# Patient Record
Sex: Male | Born: 1935 | Race: White | Hispanic: No | Marital: Married | Smoking: Former smoker
Health system: Southern US, Community
[De-identification: ages and names within clinical notes are randomized; demographics above are authoritative.]

## PROBLEM LIST (undated history)

## (undated) DIAGNOSIS — E119 Type 2 diabetes mellitus without complications: Secondary | ICD-10-CM

## (undated) DIAGNOSIS — K635 Polyp of colon: Secondary | ICD-10-CM

## (undated) DIAGNOSIS — C679 Malignant neoplasm of bladder, unspecified: Secondary | ICD-10-CM

## (undated) DIAGNOSIS — I4891 Unspecified atrial fibrillation: Secondary | ICD-10-CM

## (undated) DIAGNOSIS — I639 Cerebral infarction, unspecified: Secondary | ICD-10-CM

## (undated) DIAGNOSIS — M109 Gout, unspecified: Secondary | ICD-10-CM

## (undated) DIAGNOSIS — I4510 Unspecified right bundle-branch block: Secondary | ICD-10-CM

## (undated) DIAGNOSIS — E7800 Pure hypercholesterolemia, unspecified: Secondary | ICD-10-CM

## (undated) DIAGNOSIS — I10 Essential (primary) hypertension: Secondary | ICD-10-CM

## (undated) DIAGNOSIS — I341 Nonrheumatic mitral (valve) prolapse: Secondary | ICD-10-CM

## (undated) DIAGNOSIS — R251 Tremor, unspecified: Secondary | ICD-10-CM

## (undated) DIAGNOSIS — C61 Malignant neoplasm of prostate: Secondary | ICD-10-CM

## (undated) DIAGNOSIS — I6529 Occlusion and stenosis of unspecified carotid artery: Secondary | ICD-10-CM

## (undated) HISTORY — PX: CHOLECYSTECTOMY: SHX55

## (undated) HISTORY — PX: CAROTID ARTERY ANGIOPLASTY: SHX1300

## (undated) HISTORY — PX: APPENDECTOMY: SHX54

---

## 2007-08-20 ENCOUNTER — Ambulatory Visit: Payer: Self-pay | Admitting: Gastroenterology

## 2007-11-13 ENCOUNTER — Ambulatory Visit: Payer: Self-pay | Admitting: Internal Medicine

## 2007-11-25 ENCOUNTER — Ambulatory Visit: Payer: Self-pay | Admitting: Internal Medicine

## 2007-12-10 ENCOUNTER — Inpatient Hospital Stay: Payer: Self-pay | Admitting: Vascular Surgery

## 2008-04-07 ENCOUNTER — Ambulatory Visit: Payer: Self-pay | Admitting: Internal Medicine

## 2008-12-13 ENCOUNTER — Ambulatory Visit: Payer: Self-pay | Admitting: Internal Medicine

## 2009-01-03 ENCOUNTER — Ambulatory Visit: Payer: Self-pay | Admitting: Internal Medicine

## 2009-02-02 ENCOUNTER — Ambulatory Visit: Payer: Self-pay | Admitting: Internal Medicine

## 2009-03-01 ENCOUNTER — Inpatient Hospital Stay: Payer: Self-pay | Admitting: Internal Medicine

## 2009-03-04 ENCOUNTER — Ambulatory Visit: Payer: Self-pay | Admitting: Internal Medicine

## 2009-03-24 ENCOUNTER — Ambulatory Visit: Payer: Self-pay | Admitting: Cardiothoracic Surgery

## 2009-03-25 ENCOUNTER — Ambulatory Visit: Payer: Self-pay | Admitting: Specialist

## 2009-10-19 ENCOUNTER — Ambulatory Visit: Payer: Self-pay | Admitting: Internal Medicine

## 2010-07-10 ENCOUNTER — Emergency Department: Payer: Self-pay | Admitting: Emergency Medicine

## 2010-07-12 ENCOUNTER — Ambulatory Visit: Payer: Self-pay | Admitting: Gastroenterology

## 2010-07-14 LAB — PATHOLOGY REPORT

## 2010-09-21 ENCOUNTER — Inpatient Hospital Stay: Payer: Self-pay | Admitting: Internal Medicine

## 2010-09-28 ENCOUNTER — Ambulatory Visit: Payer: Self-pay | Admitting: Internal Medicine

## 2010-10-03 ENCOUNTER — Ambulatory Visit: Payer: Self-pay | Admitting: Internal Medicine

## 2010-10-10 ENCOUNTER — Ambulatory Visit: Payer: Self-pay | Admitting: Internal Medicine

## 2010-10-23 ENCOUNTER — Ambulatory Visit: Payer: Self-pay | Admitting: Specialist

## 2010-11-15 ENCOUNTER — Ambulatory Visit: Payer: Self-pay | Admitting: Specialist

## 2011-04-12 ENCOUNTER — Ambulatory Visit: Payer: Self-pay | Admitting: Internal Medicine

## 2011-04-17 ENCOUNTER — Ambulatory Visit: Payer: Self-pay | Admitting: Specialist

## 2011-04-27 ENCOUNTER — Ambulatory Visit: Payer: Self-pay | Admitting: Otolaryngology

## 2012-04-16 ENCOUNTER — Ambulatory Visit: Payer: Self-pay | Admitting: Internal Medicine

## 2013-02-26 ENCOUNTER — Inpatient Hospital Stay: Payer: Self-pay | Admitting: Internal Medicine

## 2013-02-26 LAB — URINALYSIS, COMPLETE
Bilirubin,UR: NEGATIVE
Glucose,UR: 150 mg/dL (ref 0–75)
Hyaline Cast: 10
Leukocyte Esterase: NEGATIVE
Ph: 5 (ref 4.5–8.0)
Protein: 100
RBC,UR: 1 /HPF (ref 0–5)
Squamous Epithelial: <1

## 2013-02-26 LAB — CK TOTAL AND CKMB (NOT AT ARMC)
CK, Total: 164 U/L (ref 35–232)
CK-MB: <0.5 ng/mL — ABNORMAL LOW (ref 0.5–3.6)
CK-MB: 0.8 ng/mL (ref 0.5–3.6)

## 2013-02-26 LAB — CBC
HCT: 33.7 % — ABNORMAL LOW (ref 40.0–52.0)
MCH: 30.4 pg (ref 26.0–34.0)
MCHC: 33.1 g/dL (ref 32.0–36.0)
Platelet: 117 10*3/uL — ABNORMAL LOW (ref 150–440)
RDW: 15.8 % — ABNORMAL HIGH (ref 11.5–14.5)

## 2013-02-26 LAB — TROPONIN I
Troponin-I: 0.12 ng/mL — ABNORMAL HIGH
Troponin-I: 0.43 ng/mL — ABNORMAL HIGH

## 2013-02-26 LAB — COMPREHENSIVE METABOLIC PANEL
Albumin: 2.4 g/dL — ABNORMAL LOW (ref 3.4–5.0)
Alkaline Phosphatase: 52 U/L (ref 50–136)
BUN: 31 mg/dL — ABNORMAL HIGH (ref 7–18)
Chloride: 102 mmol/L (ref 98–107)
Co2: 26 mmol/L (ref 21–32)
Creatinine: 1.96 mg/dL — ABNORMAL HIGH (ref 0.60–1.30)
EGFR (African American): 36 — ABNORMAL LOW
EGFR (Non-African Amer.): 31 — ABNORMAL LOW
Osmolality: 284 (ref 275–301)
Potassium: 3.6 mmol/L (ref 3.5–5.1)
SGOT(AST): 27 U/L (ref 15–37)
SGPT (ALT): 14 U/L (ref 12–78)
Total Protein: 5.9 g/dL — ABNORMAL LOW (ref 6.4–8.2)

## 2013-02-26 LAB — PROTIME-INR: INR: 1.2

## 2013-02-26 LAB — LIPASE, BLOOD: Lipase: 61 U/L — ABNORMAL LOW (ref 73–393)

## 2013-02-27 LAB — CBC WITH DIFFERENTIAL/PLATELET
Basophil %: 1.7 %
Eosinophil #: 0 10*3/uL (ref 0.0–0.7)
HCT: 34.2 % — ABNORMAL LOW (ref 40.0–52.0)
Lymphocyte #: 0.2 10*3/uL — ABNORMAL LOW (ref 1.0–3.6)
Lymphocyte %: 2.3 %
MCHC: 33 g/dL (ref 32.0–36.0)
Monocyte %: 5.5 %
Neutrophil #: 8 10*3/uL — ABNORMAL HIGH (ref 1.4–6.5)
Platelet: 102 10*3/uL — ABNORMAL LOW (ref 150–440)
RBC: 3.69 10*6/uL — ABNORMAL LOW (ref 4.40–5.90)
RDW: 15.6 % — ABNORMAL HIGH (ref 11.5–14.5)
WBC: 8.8 10*3/uL (ref 3.8–10.6)

## 2013-02-27 LAB — COMPREHENSIVE METABOLIC PANEL
Albumin: 2.1 g/dL — ABNORMAL LOW (ref 3.4–5.0)
Alkaline Phosphatase: 47 U/L — ABNORMAL LOW (ref 50–136)
Anion Gap: 6 — ABNORMAL LOW (ref 7–16)
Bilirubin,Total: 1 mg/dL (ref 0.2–1.0)
Calcium, Total: 7.2 mg/dL — ABNORMAL LOW (ref 8.5–10.1)
Chloride: 100 mmol/L (ref 98–107)
Co2: 27 mmol/L (ref 21–32)
Creatinine: 2.18 mg/dL — ABNORMAL HIGH (ref 0.60–1.30)
Potassium: 4 mmol/L (ref 3.5–5.1)
SGPT (ALT): 15 U/L (ref 12–78)
Total Protein: 5.6 g/dL — ABNORMAL LOW (ref 6.4–8.2)

## 2013-02-27 LAB — APTT: Activated PTT: 34.2 secs (ref 23.6–35.9)

## 2013-02-27 LAB — TROPONIN I: Troponin-I: 0.89 ng/mL — ABNORMAL HIGH

## 2013-02-28 LAB — BASIC METABOLIC PANEL
Anion Gap: 4 — ABNORMAL LOW (ref 7–16)
Calcium, Total: 7.6 mg/dL — ABNORMAL LOW (ref 8.5–10.1)
Chloride: 103 mmol/L (ref 98–107)
Co2: 27 mmol/L (ref 21–32)
EGFR (Non-African Amer.): 30 — ABNORMAL LOW
Glucose: 326 mg/dL — ABNORMAL HIGH (ref 65–99)
Osmolality: 291 (ref 275–301)

## 2013-02-28 LAB — HEPATIC FUNCTION PANEL A (ARMC)
Alkaline Phosphatase: 51 U/L (ref 50–136)
SGOT(AST): 57 U/L — ABNORMAL HIGH (ref 15–37)
Total Protein: 5.9 g/dL — ABNORMAL LOW (ref 6.4–8.2)

## 2013-02-28 LAB — CBC WITH DIFFERENTIAL/PLATELET
Basophil #: 0 10*3/uL (ref 0.0–0.1)
Eosinophil #: 0 10*3/uL (ref 0.0–0.7)
Eosinophil %: 0 %
HCT: 34.5 % — ABNORMAL LOW (ref 40.0–52.0)
Lymphocyte #: 0.2 10*3/uL — ABNORMAL LOW (ref 1.0–3.6)
Lymphocyte %: 2.3 %
MCHC: 33.1 g/dL (ref 32.0–36.0)
MCV: 93 fL (ref 80–100)
Monocyte #: 0.7 x10 3/mm (ref 0.2–1.0)
Neutrophil #: 9.2 10*3/uL — ABNORMAL HIGH (ref 1.4–6.5)
Platelet: 99 10*3/uL — ABNORMAL LOW (ref 150–440)
RBC: 3.72 10*6/uL — ABNORMAL LOW (ref 4.40–5.90)
RDW: 15.7 % — ABNORMAL HIGH (ref 11.5–14.5)
WBC: 10.1 10*3/uL (ref 3.8–10.6)

## 2013-02-28 LAB — URINE CULTURE

## 2013-03-01 LAB — CBC WITH DIFFERENTIAL/PLATELET
Basophil #: 0 10*3/uL (ref 0.0–0.1)
Eosinophil #: 0 10*3/uL (ref 0.0–0.7)
Eosinophil %: 0.2 %
HCT: 33.4 % — ABNORMAL LOW (ref 40.0–52.0)
HGB: 10.8 g/dL — ABNORMAL LOW (ref 13.0–18.0)
Lymphocyte #: 0.4 10*3/uL — ABNORMAL LOW (ref 1.0–3.6)
MCHC: 32.4 g/dL (ref 32.0–36.0)
MCV: 93 fL (ref 80–100)
Monocyte #: 0.9 x10 3/mm (ref 0.2–1.0)
Monocyte %: 7.2 %
Neutrophil %: 88.9 %
Platelet: 124 10*3/uL — ABNORMAL LOW (ref 150–440)
RBC: 3.6 10*6/uL — ABNORMAL LOW (ref 4.40–5.90)

## 2013-03-01 LAB — BASIC METABOLIC PANEL
Anion Gap: 5 — ABNORMAL LOW (ref 7–16)
Calcium, Total: 7.7 mg/dL — ABNORMAL LOW (ref 8.5–10.1)
Chloride: 102 mmol/L (ref 98–107)
Co2: 27 mmol/L (ref 21–32)
Creatinine: 1.94 mg/dL — ABNORMAL HIGH (ref 0.60–1.30)
Glucose: 300 mg/dL — ABNORMAL HIGH (ref 65–99)
Osmolality: 296 (ref 275–301)
Sodium: 134 mmol/L — ABNORMAL LOW (ref 136–145)

## 2013-03-02 LAB — BASIC METABOLIC PANEL
BUN: 53 mg/dL — ABNORMAL HIGH (ref 7–18)
Calcium, Total: 7.8 mg/dL — ABNORMAL LOW (ref 8.5–10.1)
Chloride: 101 mmol/L (ref 98–107)
Co2: 28 mmol/L (ref 21–32)
Creatinine: 2.09 mg/dL — ABNORMAL HIGH (ref 0.60–1.30)
EGFR (Non-African Amer.): 29 — ABNORMAL LOW
Osmolality: 290 (ref 275–301)
Sodium: 135 mmol/L — ABNORMAL LOW (ref 136–145)

## 2013-03-02 LAB — CBC WITH DIFFERENTIAL/PLATELET
Basophil #: 0 10*3/uL (ref 0.0–0.1)
Basophil %: 0.2 %
Eosinophil #: 0.1 10*3/uL (ref 0.0–0.7)
HGB: 11.3 g/dL — ABNORMAL LOW (ref 13.0–18.0)
Lymphocyte %: 5.6 %
MCH: 30.8 pg (ref 26.0–34.0)
MCV: 92 fL (ref 80–100)
Neutrophil #: 9.2 10*3/uL — ABNORMAL HIGH (ref 1.4–6.5)
Neutrophil %: 80.8 %
RBC: 3.66 10*6/uL — ABNORMAL LOW (ref 4.40–5.90)
WBC: 11.4 10*3/uL — ABNORMAL HIGH (ref 3.8–10.6)

## 2013-03-03 LAB — CBC WITH DIFFERENTIAL/PLATELET
Basophil %: 0.1 %
Eosinophil #: 0 10*3/uL (ref 0.0–0.7)
HCT: 32.1 % — ABNORMAL LOW (ref 40.0–52.0)
HGB: 10.6 g/dL — ABNORMAL LOW (ref 13.0–18.0)
Lymphocyte #: 0.3 10*3/uL — ABNORMAL LOW (ref 1.0–3.6)
Lymphocyte %: 3 %
MCHC: 33.1 g/dL (ref 32.0–36.0)
Monocyte #: 0.3 x10 3/mm (ref 0.2–1.0)
Platelet: 203 10*3/uL (ref 150–440)
RDW: 15.1 % — ABNORMAL HIGH (ref 11.5–14.5)
WBC: 10.3 10*3/uL (ref 3.8–10.6)

## 2013-03-03 LAB — BASIC METABOLIC PANEL
Calcium, Total: 7.7 mg/dL — ABNORMAL LOW (ref 8.5–10.1)
Chloride: 103 mmol/L (ref 98–107)
Co2: 23 mmol/L (ref 21–32)
Creatinine: 1.79 mg/dL — ABNORMAL HIGH (ref 0.60–1.30)
EGFR (African American): 40 — ABNORMAL LOW
EGFR (Non-African Amer.): 35 — ABNORMAL LOW
Glucose: 252 mg/dL — ABNORMAL HIGH (ref 65–99)
Osmolality: 287 (ref 275–301)
Sodium: 133 mmol/L — ABNORMAL LOW (ref 136–145)

## 2013-03-04 ENCOUNTER — Emergency Department: Payer: Self-pay | Admitting: Emergency Medicine

## 2013-03-04 LAB — CULTURE, BLOOD (SINGLE)

## 2013-03-05 ENCOUNTER — Telehealth: Payer: Self-pay | Admitting: Family Medicine

## 2013-03-05 DIAGNOSIS — K810 Acute cholecystitis: Secondary | ICD-10-CM

## 2013-03-05 DIAGNOSIS — E119 Type 2 diabetes mellitus without complications: Secondary | ICD-10-CM

## 2013-03-05 DIAGNOSIS — K219 Gastro-esophageal reflux disease without esophagitis: Secondary | ICD-10-CM

## 2013-03-05 DIAGNOSIS — I10 Essential (primary) hypertension: Secondary | ICD-10-CM

## 2013-03-05 NOTE — Telephone Encounter (Signed)
[facility] Triage Call Report Triage Record Num: [id number] Operator: Claudie Leach Patient Name: Dean Garcia Call Date & Time: 03/04/2013 8:26:26PM Patient Phone: [phone] PCP: Tillman Abide Patient Gender: Male PCP Fax : [phone] Patient DOB: 10/24/1928 Practice Name: [facility] Reason for Call: Caller: Tom/RN; PCP: Tillman Abide (Family Practice); CB#: [phone]; Call regarding Post-op bleeding; Had Gallbladder removed on 02/27/13. Started bleeding on 03/04/13 and the entire front of the pajamas are wet. States it is more clear than bloody. Blood pressure is 146/64, pulse 64, respirations 28, and temperature 97.4. States they have applied a pressure dressing and it is continuing to bleed. Triaged per Postoperative Wound Care guideline. To activate EMS 911 due to continuous or heavy bleeding from operative side and not controlled with 10 minutes of steady pressure. Advised to be sent to the ED and caller agreed. Protocol(s) Used: Postoperative Wound Care Recommended Outcome per Protocol: Activate EMS 911 Reason for Outcome: Continuous or heavy bleeding from operative site and NOT controlled with 10 minutes of steady pressure Care Advice: ~ IMMEDIATE ACTION 03/04/2013 8:37:21PM Page 1 of 1 [email or web address]

## 2013-03-05 NOTE — Telephone Encounter (Signed)
Seen today Wound is fine

## 2013-03-10 DIAGNOSIS — M1A9XX1 Chronic gout, unspecified, with tophus (tophi): Secondary | ICD-10-CM

## 2013-03-11 DIAGNOSIS — Z48814 Encounter for surgical aftercare following surgery on the teeth or oral cavity: Secondary | ICD-10-CM

## 2013-03-11 DIAGNOSIS — Z48815 Encounter for surgical aftercare following surgery on the digestive system: Secondary | ICD-10-CM

## 2013-05-13 ENCOUNTER — Ambulatory Visit: Payer: Self-pay | Admitting: Internal Medicine

## 2013-05-25 ENCOUNTER — Other Ambulatory Visit: Payer: Self-pay

## 2013-05-25 LAB — TROPONIN I: Troponin-I: 0.04 ng/mL

## 2013-06-04 ENCOUNTER — Ambulatory Visit: Payer: Self-pay | Admitting: Physician Assistant

## 2014-04-01 ENCOUNTER — Ambulatory Visit: Payer: Self-pay | Admitting: Otolaryngology

## 2014-04-01 DIAGNOSIS — E119 Type 2 diabetes mellitus without complications: Secondary | ICD-10-CM

## 2014-04-01 DIAGNOSIS — I2510 Atherosclerotic heart disease of native coronary artery without angina pectoris: Secondary | ICD-10-CM

## 2014-04-01 DIAGNOSIS — I10 Essential (primary) hypertension: Secondary | ICD-10-CM

## 2014-04-01 LAB — CBC WITH DIFFERENTIAL/PLATELET
BASOS ABS: 0 10*3/uL (ref 0.0–0.1)
BASOS PCT: 0.4 %
Eosinophil #: 0.2 10*3/uL (ref 0.0–0.7)
Eosinophil %: 2.3 %
HCT: 43 % (ref 40.0–52.0)
HGB: 14 g/dL (ref 13.0–18.0)
LYMPHS ABS: 0.6 10*3/uL — AB (ref 1.0–3.6)
LYMPHS PCT: 9.3 %
MCH: 31.1 pg (ref 26.0–34.0)
MCHC: 32.5 g/dL (ref 32.0–36.0)
MCV: 96 fL (ref 80–100)
Monocyte #: 0.9 x10 3/mm (ref 0.2–1.0)
Monocyte %: 13.1 %
Neutrophil #: 5.1 10*3/uL (ref 1.4–6.5)
Neutrophil %: 74.9 %
PLATELETS: 168 10*3/uL (ref 150–440)
RBC: 4.48 10*6/uL (ref 4.40–5.90)
RDW: 16.1 % — AB (ref 11.5–14.5)
WBC: 6.8 10*3/uL (ref 3.8–10.6)

## 2014-04-01 LAB — BASIC METABOLIC PANEL
Anion Gap: 5 — ABNORMAL LOW (ref 7–16)
BUN: 27 mg/dL — AB (ref 7–18)
CO2: 29 mmol/L (ref 21–32)
Calcium, Total: 8.9 mg/dL (ref 8.5–10.1)
Chloride: 105 mmol/L (ref 98–107)
Creatinine: 1.4 mg/dL — ABNORMAL HIGH (ref 0.60–1.30)
EGFR (African American): 53 — ABNORMAL LOW
EGFR (Non-African Amer.): 46 — ABNORMAL LOW
GLUCOSE: 123 mg/dL — AB (ref 65–99)
Osmolality: 284 (ref 275–301)
Potassium: 4.4 mmol/L (ref 3.5–5.1)
Sodium: 139 mmol/L (ref 136–145)

## 2014-04-08 ENCOUNTER — Ambulatory Visit: Payer: Self-pay | Admitting: Otolaryngology

## 2014-04-13 LAB — PATHOLOGY REPORT

## 2014-06-07 DIAGNOSIS — C679 Malignant neoplasm of bladder, unspecified: Secondary | ICD-10-CM | POA: Insufficient documentation

## 2014-06-07 DIAGNOSIS — G629 Polyneuropathy, unspecified: Secondary | ICD-10-CM | POA: Insufficient documentation

## 2014-06-07 DIAGNOSIS — M109 Gout, unspecified: Secondary | ICD-10-CM | POA: Insufficient documentation

## 2014-06-07 DIAGNOSIS — G64 Other disorders of peripheral nervous system: Secondary | ICD-10-CM | POA: Insufficient documentation

## 2014-06-07 DIAGNOSIS — E7849 Other hyperlipidemia: Secondary | ICD-10-CM | POA: Insufficient documentation

## 2014-06-07 DIAGNOSIS — M1990 Unspecified osteoarthritis, unspecified site: Secondary | ICD-10-CM | POA: Insufficient documentation

## 2014-06-07 DIAGNOSIS — E119 Type 2 diabetes mellitus without complications: Secondary | ICD-10-CM | POA: Insufficient documentation

## 2014-06-07 DIAGNOSIS — N289 Disorder of kidney and ureter, unspecified: Secondary | ICD-10-CM | POA: Insufficient documentation

## 2014-06-07 DIAGNOSIS — K5790 Diverticulosis of intestine, part unspecified, without perforation or abscess without bleeding: Secondary | ICD-10-CM | POA: Insufficient documentation

## 2014-06-07 DIAGNOSIS — K627 Radiation proctitis: Secondary | ICD-10-CM | POA: Insufficient documentation

## 2014-06-07 DIAGNOSIS — K629 Disease of anus and rectum, unspecified: Secondary | ICD-10-CM | POA: Insufficient documentation

## 2014-06-07 DIAGNOSIS — C61 Malignant neoplasm of prostate: Secondary | ICD-10-CM | POA: Insufficient documentation

## 2014-07-12 DIAGNOSIS — I10 Essential (primary) hypertension: Secondary | ICD-10-CM | POA: Insufficient documentation

## 2014-07-12 DIAGNOSIS — I6529 Occlusion and stenosis of unspecified carotid artery: Secondary | ICD-10-CM | POA: Insufficient documentation

## 2014-07-12 DIAGNOSIS — I341 Nonrheumatic mitral (valve) prolapse: Secondary | ICD-10-CM | POA: Insufficient documentation

## 2014-07-17 DIAGNOSIS — E119 Type 2 diabetes mellitus without complications: Secondary | ICD-10-CM | POA: Insufficient documentation

## 2014-07-17 DIAGNOSIS — R609 Edema, unspecified: Secondary | ICD-10-CM | POA: Insufficient documentation

## 2014-07-17 DIAGNOSIS — R251 Tremor, unspecified: Secondary | ICD-10-CM | POA: Insufficient documentation

## 2014-09-06 ENCOUNTER — Encounter: Payer: Self-pay | Admitting: Surgery

## 2014-09-14 DIAGNOSIS — M109 Gout, unspecified: Secondary | ICD-10-CM | POA: Insufficient documentation

## 2014-09-14 DIAGNOSIS — M1000 Idiopathic gout, unspecified site: Secondary | ICD-10-CM | POA: Insufficient documentation

## 2014-09-23 ENCOUNTER — Emergency Department: Payer: Self-pay | Admitting: Emergency Medicine

## 2014-09-23 LAB — BASIC METABOLIC PANEL
ANION GAP: 7 (ref 7–16)
BUN: 40 mg/dL — AB (ref 7–18)
CREATININE: 1.88 mg/dL — AB (ref 0.60–1.30)
Calcium, Total: 8.3 mg/dL — ABNORMAL LOW (ref 8.5–10.1)
Chloride: 100 mmol/L (ref 98–107)
Co2: 33 mmol/L — ABNORMAL HIGH (ref 21–32)
EGFR (African American): 44 — ABNORMAL LOW
EGFR (Non-African Amer.): 37 — ABNORMAL LOW
Glucose: 87 mg/dL (ref 65–99)
OSMOLALITY: 289 (ref 275–301)
Potassium: 4.2 mmol/L (ref 3.5–5.1)
Sodium: 140 mmol/L (ref 136–145)

## 2014-09-23 LAB — CBC
HCT: 42.5 % (ref 40.0–52.0)
HGB: 13.9 g/dL (ref 13.0–18.0)
MCH: 31.9 pg (ref 26.0–34.0)
MCHC: 32.6 g/dL (ref 32.0–36.0)
MCV: 98 fL (ref 80–100)
Platelet: 194 10*3/uL (ref 150–440)
RBC: 4.35 10*6/uL — ABNORMAL LOW (ref 4.40–5.90)
RDW: 14.4 % (ref 11.5–14.5)
WBC: 8.1 10*3/uL (ref 3.8–10.6)

## 2014-10-23 DIAGNOSIS — I700 Atherosclerosis of aorta: Secondary | ICD-10-CM | POA: Insufficient documentation

## 2014-11-01 ENCOUNTER — Encounter: Payer: Self-pay | Admitting: Surgery

## 2014-11-02 ENCOUNTER — Encounter: Payer: Self-pay | Admitting: Surgery

## 2014-11-05 ENCOUNTER — Ambulatory Visit: Payer: Self-pay | Admitting: Specialist

## 2014-11-06 ENCOUNTER — Encounter: Payer: Self-pay | Admitting: Surgery

## 2014-11-10 ENCOUNTER — Emergency Department: Payer: Self-pay | Admitting: Emergency Medicine

## 2015-03-02 ENCOUNTER — Emergency Department
Admit: 2015-03-02 | Disposition: A | Discharge status: Home / Self Care | Payer: Self-pay | Admitting: Emergency Medicine

## 2015-03-02 LAB — COMPREHENSIVE METABOLIC PANEL
ANION GAP: 13 (ref 7–16)
Albumin: 3.7 g/dL
Alkaline Phosphatase: 65 U/L
BUN: 71 mg/dL — ABNORMAL HIGH
Bilirubin,Total: 0.4 mg/dL
CALCIUM: 8.9 mg/dL
CREATININE: 2.49 mg/dL — AB
Chloride: 96 mmol/L — ABNORMAL LOW
Co2: 30 mmol/L
GFR CALC AF AMER: 26 — AB
GFR CALC NON AF AMER: 23 — AB
Glucose: 167 mg/dL — ABNORMAL HIGH
POTASSIUM: 3.7 mmol/L
SGOT(AST): 27 U/L
SGPT (ALT): 19 U/L
SODIUM: 139 mmol/L
TOTAL PROTEIN: 7.1 g/dL

## 2015-03-02 LAB — BASIC METABOLIC PANEL
ANION GAP: 13 (ref 7–16)
BUN: 62 mg/dL — AB
Calcium, Total: 8.1 mg/dL — ABNORMAL LOW
Chloride: 98 mmol/L — ABNORMAL LOW
Co2: 29 mmol/L
Creatinine: 2.14 mg/dL — ABNORMAL HIGH
EGFR (African American): 32 — ABNORMAL LOW
GFR CALC NON AF AMER: 27 — AB
Glucose: 149 mg/dL — ABNORMAL HIGH
POTASSIUM: 3.1 mmol/L — AB
Sodium: 140 mmol/L

## 2015-03-02 LAB — CBC WITH DIFFERENTIAL/PLATELET
BASOS PCT: 0.5 %
Basophil #: 0 10*3/uL (ref 0.0–0.1)
EOS PCT: 1.1 %
Eosinophil #: 0.1 10*3/uL (ref 0.0–0.7)
HCT: 40.8 % (ref 40.0–52.0)
HGB: 13.4 g/dL (ref 13.0–18.0)
Lymphocyte #: 0.7 10*3/uL — ABNORMAL LOW (ref 1.0–3.6)
Lymphocyte %: 8 %
MCH: 31.5 pg (ref 26.0–34.0)
MCHC: 32.8 g/dL (ref 32.0–36.0)
MCV: 96 fL (ref 80–100)
Monocyte #: 1 x10 3/mm (ref 0.2–1.0)
Monocyte %: 12.2 %
Neutrophil #: 6.5 10*3/uL (ref 1.4–6.5)
Neutrophil %: 78.2 %
Platelet: 220 10*3/uL (ref 150–440)
RBC: 4.25 10*6/uL — AB (ref 4.40–5.90)
RDW: 14.9 % — ABNORMAL HIGH (ref 11.5–14.5)
WBC: 8.3 10*3/uL (ref 3.8–10.6)

## 2015-03-02 LAB — LACTIC ACID, PLASMA
LACTIC ACID, VENOUS: 2.4 mmol/L — AB
Lactic Acid, Venous: 1.5 mmol/L

## 2015-03-02 LAB — URINALYSIS, COMPLETE
BLOOD: NEGATIVE
Bacteria: NONE SEEN
Bilirubin,UR: NEGATIVE
Glucose,UR: NEGATIVE mg/dL (ref 0–75)
KETONE: NEGATIVE
Leukocyte Esterase: NEGATIVE
Nitrite: NEGATIVE
PH: 7 (ref 4.5–8.0)
PROTEIN: NEGATIVE
Specific Gravity: 1.009 (ref 1.003–1.030)
Squamous Epithelial: NONE SEEN
WBC UR: <1 /HPF (ref 0–5)

## 2015-03-02 LAB — LIPASE, BLOOD: LIPASE: 56 U/L — AB

## 2015-03-02 LAB — MAGNESIUM: Magnesium: 2.3 mg/dL

## 2015-03-07 LAB — CULTURE, BLOOD (SINGLE)

## 2015-03-18 ENCOUNTER — Other Ambulatory Visit: Payer: Self-pay | Admitting: Specialist

## 2015-03-18 DIAGNOSIS — R918 Other nonspecific abnormal finding of lung field: Secondary | ICD-10-CM

## 2015-03-25 NOTE — H&P (Signed)
PATIENT NAME:  Dean Garcia, Dean Garcia MR#:  [id number] DATE OF BIRTH:  Nov 27, 1928  DATE OF ADMISSION:  02/26/2013  ADDENDUM  CT of the abdomen is obtained showing acute cholecystitis with air-fluid levels, possible air-forming organism and thickening of the gallbladder wall and pericholecystic fluid. I got the report directly from the radiologist.   The patient at this moment is stable, but we are going to order a surgical consultation. The patient is not cleared for surgery right now, as he has elevated troponin. His elevated troponin is very likely to be secondary to troponin leak due to his acute kidney injury, although I cannot be 100% sure, for what we are going to do troponins every 8 hours, and I am going to order an echocardiograms and cardiac consultation prior to clearing him for surgery.   I discussed the case with radiology and with Dr. Marina Gravel, who is kind enough to see the patient later on.   ____________________________ Latah Sink, MD [email or web address] D: 02/26/2013 19:15:05 ET T: 02/26/2013 19:38:12 ET JOB#: [id number]  cc: Archer Sink, MD, <Dictator> Ellionna Buckbee America Brown MD ELECTRONICALLY SIGNED 02/27/2013 13:40

## 2015-03-25 NOTE — Discharge Summary (Signed)
Dates of Admission and Diagnosis:  Date of Admission 26-Feb-2013   Date of Discharge 03-Mar-2013   Admitting Diagnosis choleycystitis   Final Diagnosis choleycystitis   Discharge Diagnosis 1 Encephalopathy   2 DM II, uncontrolled   3 HTN   4 COPD flair    Chief Complaint/History of Present Illness see H and P   LabObservation:  28-Mar-14 07:32   OBSERVATION Reason for Test  Hepatic:  27-Mar-14 14:05   Bilirubin, Total 1.0  Alkaline Phosphatase 52  SGPT (ALT) 14  SGOT (AST) 27  Total Protein, Serum  5.9  Albumin, Serum  2.4  28-Mar-14 05:43   Bilirubin, Total 1.0  Alkaline Phosphatase  47  SGPT (ALT) 15  SGOT (AST) 22  Total Protein, Serum  5.6  Albumin, Serum  2.1  29-Mar-14 05:14   Bilirubin, Total 0.7  Bilirubin, Direct 0.2 (Result(s) reported on 28 Feb 2013 at 05:42AM.)  Alkaline Phosphatase 51  SGPT (ALT) 34  SGOT (AST)  57  Total Protein, Serum  5.9  Albumin, Serum  2.0  Pathology:  28-Mar-14 00:12   Pathology Report ========== TEST NAME ==========  ========= RESULTS =========  = REFERENCE RANGE =  PATHOLOGY REPORT  Pathology Report .                               [   Final Report         ]                   Material submitted:         Marland Kitchen GALLBLADDER .                               [   Final Report         ]                   Pre-operative diagnosis:                                        . EMPHYSEMATOUS CHOLECYSTITIS LAPAROSCOPIC CHOLECYSTECTOMY .          [   Final Report         ]                   ********************************************************************** Diagnosis: GALLBLADDER: - ACUTE CHOLECYSTITIS AND CHOLELITHIASIS. - NEGATIVE FOR DYSPLASIA AND MALIGNANCY. . XDB/03/02/2013 ********************************************************************** .                               [   Final Report         ]                   Electronically signed:                                     . Lum Babe, MD, Pathologist .                                [   Final Report         ]  Gross description:                                         . Received in a formalin filled container labeled Cashel Bellina is an 11.7 x 5.7 x 2.8 cm gallbladder.  The serosalsurfaces are mottled pink tan to green tan with adherent pyogenic membrane. Within the lumen is a dark red brown slightly viscous fluid admixed with multiple, up to 100, black multifaceted firm calculi measuring up to 0.8 cm in greatest dimensions.  The mucosa is bile stained and focally necrotic.  The wall thickness ranges from 0.1 to 0.3 cm.  The cystic duct is partially obstructed by multiple calculi.  No discrete pericystic duct lymph node candidate is identified.  Representative sections are submitted in cassette 1. Part A, Block 1 - 3 pieces QAC/ASB .                               [   Final Report         ]                   Pathologist provided ICD-9: 574.00 .                               [   Final Report         ]             CPT                                                        . 330076               [facility]            No: [id number]           [address] [place], [place] [address]           Lindon Romp, [place]                                         [phone] [id number]   Result(s) reported on 02 Mar 2013 at 01:53PM.  LabUnknown:  27-Mar-14 15:35   CEFOXITIN S  Routine BB:  28-Mar-14 09:55   ABO Group + Rh Type A Positive  Antibody Screen NEGATIVE (Result(s) reported on 27 Feb 2013 at 10:55AM.)  Routine Micro:  27-Mar-14 15:35   Organism Name ESCHERICHIA COLI  Micro Text Report BLOOD CULTURE   ORGANISM 1                ESCHERICHIA COLI   COMMENT                   IN AEROBIC BOTTLE ONLY   GRAM STAIN                GRAM NEGATIVE ROD   ANTIBIOTIC  ORG#1     AMPICILLIN                    R         CEFAZOLIN                     S         CEFOXITIN                      S         CEFTAZIDIME                   S         CEFTRIAXONE                   S         CIPROFLOXACIN                 S         GENTAMICIN                    S         IMIPENEM                   S         LEVOFLOXACIN                  S  Micro Text Report BLOOD CULTURE   COMMENT                   NO GROWTH IN 36 HOURS   ANTIBIOTIC                       Organism 1 ESCHERICHIA COLI  Culture Comment IN AEROBIC BOTTLE ONLY  Culture Comment NO GROWTH IN 36 HOURS  Result(s) reported on 28 Feb 2013 at 06:48AM.  Cefazolin Sensitivity S  Ampicillin Sensitivity R  Ceftriaxone Sensitivity S  Ciprofloxacin Sensitivity S  Gentamicin Sensitivity S  Ceftazidime Sensitivity S  Imipenem Sensitivity S  Lefofloxacin Sensitivity S  Culture Comment . ID TO FOLLOW SENSITIVITIES TO FOLLOW  Gram Stain 1 GRAM NEGATIVE ROD    16:31   Organism Name AEROBIC GRAM POS ROD,NO FURTHER ID  Organism Quantity 3000 CFU/ML  Micro Text Report URINE CULTURE   ORGANISM 1                3000 CFU/ML AEROBIC GRAM POS ROD,NO FURTHER ID   COMMENT                   -   ANTIBIOTIC                       Specimen Source CLEAN CATCH  Organism 1 3000 CFU/ML AEROBIC GRAM POS ROD,NO FURTHER ID  Culture Comment - (Result(s) reported on 28 Feb 2013 at 12:46PM.)  Cardiology:  28-Mar-14 07:32   Echo Doppler REASON FOR EXAM:     COMMENTS:     PROCEDURE: [facility] - ECHO DOPPLER COMPLETE  - Feb 27 2013  7:32AM   RESULT: Echocardiogram Report  Patient Name:   Dean Garcia Date of Exam: 02/27/2013 Medical Rec #:  [id number]     Custom1: Date of Birth:  May 10, 1928 Height:       74.0 in Patient Age:    81 years   Weight:  189.6 lb Patient Gender: M          BSA:          2.12 m??  Indications: Angina Sonographer:    JERRY HEGE RDCS Referring Phys: Frazier Richards, W  Summary:  1. Left ventricular ejection fraction, by visual estimation, is 40 to  45%.  2. Mildly decreased global left ventricular systolic function.   3. Mildly dilated left atrium.  4. Mildly dilated right atrium.  5. Mild to moderate mitral valve regurgitation.  6. Mild to moderate tricuspid regurgitation.  7. Mildly increased left ventricular posterior wall thickness. 2D AND M-MODE MEASUREMENTS (normal ranges within parentheses): Left Ventricle:          Normal IVSd (2D):      1.23 cm (0.7-1.1) LVPWd (2D): 1.22 cm (0.7-1.1) Aorta/LA:                  Normal LVIDd (2D):     4.65 cm (3.4-5.7) Aortic Root (2D): 3.20 cm (2.4-3.7) LVIDs (2D):     3.20 cm           Left Atrium (2D): 5.00 cm (1.9-4.0) LV FS (2D):     31.1 %   (>25%) LV EF (2D):     58.8 %   (>50%)                                   Right Ventricle:                                   RVd (2D):        0.03 cm LV DIASTOLIC FUNCTION: MV Peak E: 0.80 m/s E/e' Ratio: 14.20 MV Peak A: 0.61 m/s Decel Time: 222 msec E/A Ratio: 1.32 SPECTRAL DOPPLER ANALYSIS (where applicable): Mitral Valve: MV P1/2 Time: 64.38 msec MV Area, PHT: 3.42 cm?? Aortic Valve: AoV Max Vel: 1.41 m/s AoV Peak PG: 8.0 mmHg AoV Mean PG: LVOT Vmax: 0.88 m/s LVOT VTI:  LVOT Diameter: 2.10 cm AoV Area, Vmax: 2.17 cm?? AoV Area, VTI:  AoV Area, Vmn: Tricuspid Valve and PA/RV Systolic Pressure: TR Max Velocity: 2.51 m/s RA  Pressure: 5 mmHg RVSP/PASP: 30.2 mmHg Pulmonic Valve: PV Max Velocity: 0.90 m/s PV Max PG: 3.3 mmHg PV Mean PG:  PHYSICIAN INTERPRETATION: Left Ventricle: The left ventricular internal cavity size was normal. LV  posterior wall thickness was mildly increased. Global LV systolic  function was mildly decreased. Left ventricular ejection fraction, by  visual estimation, is 40 to45%. Right Ventricle: Normal right ventricular size, wall thickness, and  systolic function. RV wall thickness is normal. Left Atrium: The left atrium is mildly dilated. Right Atrium: The right atrium is mildly dilated. Pericardium: There is no evidence of pericardial effusion. Mitral Valve: Mild to moderate mitral  valve regurgitation is seen. Tricuspid Valve: Mild to moderate tricuspid regurgitation is visualized.  The tricuspid regurgitant velocity is 2.51 m/s, and with an assumed right atrial pressure of 5 mmHg, the estimated right ventricular systolic  pressure is normal at 30.2 mmHg. Aortic Valve: The aortic valve is trileaflet and structurally normal,  with normal leaflet excursion; without any evidence of aortic stenosis or  insufficiency. Pulmonic Valve: Structurally normal pulmonic valve, with normal leaflet  excursion. Aorta: The aortic root and ascending aorta are structurally normal, with  no evidence of dilitation. Dexter MD Electronically  signed by [id number] Serafina Royals MD Signature Date/Time: 02/28/2013/11:11:53 AM  *** Final ***  IMPRESSION: .    Verified By: Corey Skains  ([facility] [employer]), M.D., MD  Routine Chem:  27-Mar-14 14:05   Glucose, Serum  196  BUN  31  Creatinine (comp)  1.96  Sodium, Serum 136  Potassium, Serum 3.6  Chloride, Serum 102  CO2, Serum 26  Calcium (Total), Serum  7.4  Anion Gap 8  Osmolality (calc) 284  eGFR (African American)  36  eGFR (Non-African American)  31 (eGFR values <21m/min/1.73 m2 may be an indication of chronic kidney disease (CKD). Calculated eGFR is useful in patients with stable renal function. The eGFR calculation will not be reliable in acutely ill patients when serum creatinine is changing rapidly. It is not useful in  patients on dialysis. The eGFR calculation may not be applicable to patients at the low and high extremes of body sizes, pregnant women, and vegetarians.)  Result Comment HEM/PLT - CLOTTED SPECIMEN. REQUESTED NEW  Result(s) reported on 26 Feb 2013 at 03:01PM.  Result Comment troponin - RESULTS VERIFIED BY REPEAT TESTING.  - c/Kelly Pendelton @15 :15 02-26-13 by  - LJW  - READ-BACK PROCESS PERFORMED.  Result(s) reported on 26 Feb 2013 at 03:18PM.  Lipase  61 (Result(s) reported on 26 Feb 2013 at  03:50PM.)    15:35   Result Comment AEROBIC BOTTLE - SKY TO KJacqulynn Cadet@ 0705/ 02-27-13  - NOTIFIED OF CRITICAL VALUE  - READ-BACK PROCESS PERFORMED.  Result(s) reported on 01 Mar 2013 at 07:22AM.    21:45   Result Comment troponin - RESULTS VERIFIED BY REPEAT TESTING.  - prev called 3/27/14by ljw at 1515.nbb  Result(s) reported on 26 Feb 2013 at 11:01PM.  28-Mar-14 05:43   Glucose, Serum  174  BUN  33  Creatinine (comp)  2.18  Sodium, Serum  133  Potassium, Serum 4.0  Chloride, Serum 100  CO2, Serum 27  Calcium (Total), Serum  7.2  Anion Gap  6 (Result(s) reported on 27 Feb 2013 at 06:17AM.)  Osmolality (calc) 278  Result Comment TROPONIN - RESULTS VERIFIED BY REPEAT TESTING.  - RESULT CALLED PREVIOUSLY 02/26/13 AT 1515  - BY LJW-DAC  Result(s) reported on 27 Feb 2013 at 06:58AM.  Magnesium, Serum 1.8 (1.8-2.4 THERAPEUTIC RANGE: 4-7 mg/dL TOXIC: > 10 mg/dL  -----------------------)  Hemoglobin A1c ([facility])  6.7 (The American Diabetes Association recommends that a primary goal of therapy should be <7% and that physicians should reevaluate the treatment regimen in patients with HbA1c values consistently >8%.)  29-Mar-14 05:14   Glucose, Serum  326  BUN  41  Creatinine (comp)  1.99  Sodium, Serum  134  Potassium, Serum 4.6  Chloride, Serum 103  CO2, Serum 27  Calcium (Total), Serum  7.6  Anion Gap  4  Osmolality (calc) 291  eGFR (African American)  35  eGFR (Non-African American)  30 (eGFR values <659mmin/1.73 m2 may be an indication of chronic kidney disease (CKD). Calculated eGFR is useful in patients with stable renal function. The eGFR calculation will not be reliable in acutely ill patients when serum creatinine is changing rapidly. It is not useful in  patients on dialysis. The eGFR calculation may not be applicable to patients at the low and high extremes of body sizes, pregnant women, and vegetarians.)  30-Mar-14 05:09   Glucose, Serum  300  BUN  58   Creatinine (comp)  1.94  Sodium, Serum  134  Potassium, Serum 4.4  Chloride, Serum 102  CO2, Serum 27  Calcium (Total), Serum  7.7  Anion Gap  5  Osmolality (calc) 296  eGFR (African American)  36  eGFR (Non-African American)  31 (eGFR values <64m/min/1.73 m2 may be an indication of chronic kidney disease (CKD). Calculated eGFR is useful in patients with stable renal function. The eGFR calculation will not be reliable in acutely ill patients when serum creatinine is changing rapidly. It is not useful in  patients on dialysis. The eGFR calculation may not be applicable to patients at the low and high extremes of body sizes, pregnant women, and vegetarians.)  31-Mar-14 04:42   Glucose, Serum  206  BUN  53  Creatinine (comp)  2.09  Sodium, Serum  135  Potassium, Serum 4.7  Chloride, Serum 101  CO2, Serum 28  Calcium (Total), Serum  7.8  Anion Gap  6  Osmolality (calc) 290  eGFR (African American)  33  eGFR (Non-African American)  29 (eGFR values <637mmin/1.73 m2 may be an indication of chronic kidney disease (CKD). Calculated eGFR is useful in patients with stable renal function. The eGFR calculation will not be reliable in acutely ill patients when serum creatinine is changing rapidly. It is not useful in  patients on dialysis. The eGFR calculation may not be applicable to patients at the low and high extremes of body sizes, pregnant women, and vegetarians.)  01-Apr-14 05:06   Glucose, Serum  252  BUN  47  Creatinine (comp)  1.79  Sodium, Serum  133  Potassium, Serum 4.8  Chloride, Serum 103  CO2, Serum 23  Calcium (Total), Serum  7.7  Anion Gap 7  Osmolality (calc) 287  eGFR (African American)  40  eGFR (Non-African American)  35 (eGFR values <6060min/1.73 m2 may be an indication of chronic kidney disease (CKD). Calculated eGFR is useful in patients with stable renal function. The eGFR calculation will not be reliable in acutely ill patients when serum  creatinine is changing rapidly. It is not useful in  patients on dialysis. The eGFR calculation may not be applicable to patients at the low and high extremes of body sizes, pregnant women, and vegetarians.)  Result Comment POTASSIUM - Slight hemolysis, interpret results with  - caution.  Result(s) reported on 03 Mar 2013 at 06:36AM.  Cardiac:  27-Mar-14 14:05   CK, Total 164  CPK-MB, Serum  < 0.5 (Result(s) reported on 26 Feb 2013 at 03:11PM.)  Troponin I  0.12 (0.00-0.05 0.05 ng/mL or less: NEGATIVE  Repeat testing in 3-6 hrs  if clinically indicated. >0.05 ng/mL: POTENTIAL  MYOCARDIAL INJURY. Repeat  testing in 3-6 hrs if  clinically indicated. NOTE: An increase or decrease  of 30% or more on serial  testing suggests a  clinically important change)    21:45   CK, Total 187  CPK-MB, Serum 0.8 (Result(s) reported on 26 Feb 2013 at 10:56PM.)  Troponin I  0.43 (0.00-0.05 0.05 ng/mL or less: NEGATIVE  Repeat testing in 3-6 hrs  if clinically indicated. >0.05 ng/mL: POTENTIAL  MYOCARDIAL INJURY. Repeat  testing in 3-6 hrs if  clinically indicated. NOTE: An increase or decrease  of 30% or more on serial  testing suggests a  clinically important change)  28-Mar-14 05:43   CK, Total 149  CPK-MB, Serum 1.2 (Result(s) reported on 27 Feb 2013 at 06:30AM.)  Troponin I  0.89 (0.00-0.05 0.05 ng/mL or less: NEGATIVE  Repeat testing in 3-6 hrs  if clinically indicated. >0.05 ng/mL: POTENTIAL  MYOCARDIAL INJURY. Repeat  testing in 3-6 hrs if  clinically indicated. NOTE: An increase or decrease  of 30% or more on serial  testing suggests a  clinically important change)  Routine UA:  27-Mar-14 16:31   Color (UA) Yellow  Clarity (UA) Hazy  Glucose (UA) 150 mg/dL  Bilirubin (UA) Negative  Ketones (UA) Negative  Specific Gravity (UA) 1.014  Blood (UA) 2+  pH (UA) 5.0  Protein (UA) 100 mg/dL  Nitrite (UA) Negative  Leukocyte Esterase (UA) Negative (Result(s) reported on 26 Feb 2013 at 04:57PM.)  RBC (UA) 1 /HPF  WBC (UA) <1 /HPF  Bacteria (UA) NONE SEEN  Epithelial Cells (UA) <1 /HPF  Mucous (UA) PRESENT  Hyaline Cast (UA) 10 /LPF (Result(s) reported on 26 Feb 2013 at 04:57PM.)  Routine Coag:  27-Mar-14 14:05   Prothrombin  15.5  INR 1.2 (INR reference interval applies to patients on anticoagulant therapy. A single INR therapeutic range for coumarins is not optimal for all indications; however, the suggested range for most indications is 2.0 - 3.0. Exceptions to the INR Reference Range may include: Prosthetic heart valves, acute myocardial infarction, prevention of myocardial infarction, and combinations of aspirin and anticoagulant. The need for a higher or lower target INR must be assessed individually. Reference: The Pharmacology and Management of the Vitamin K  antagonists: the seventh ACCP Conference on Antithrombotic and Thrombolytic Therapy. XBWIO.0355 Sept:126 (3suppl): [id number]. A HCT value >55% may artifactually increase the PT.  In one study,  the increase was an average of 25%. Reference:  "Effect on Routine and Special Coagulation Testing Values of Citrate Anticoagulant Adjustment in Patients with High HCT Values." American Journal of Clinical Pathology 9741;638:453-646.)  28-Mar-14 09:55   Activated PTT (APTT) 34.2 (A HCT value >55% may artifactually increase the APTT. In one study, the increase was an average of 19%. Reference: "Effect on Routine and Special Coagulation Testing Values of Citrate Anticoagulant Adjustment in Patients with High HCT Values." American Journal of Clinical Pathology 2006;126:400-405.)  Fibrinogen > 750 (A HCT value >55% may artifactually increase the Fibrinogen.  In one study, the increase was an average of 10%. Reference:  "Effect of Routine and Special Coagulation Testing Values of Citrate Anticoagulant Adjustment in Patients with High HCT Values." American Journal of Clinical Pathology 2006;126:400-405.)   Routine Hem:  27-Mar-14 14:05   WBC (CBC) -  RBC (CBC) -  Hemoglobin (CBC) -  Hematocrit (CBC) -  Platelet Count (CBC) -  MCV -  MCH -  MCHC -  RDW -    15:08   WBC (CBC)  11.3  RBC (CBC)  3.68  Hemoglobin (CBC)  11.2  Hematocrit (CBC)  33.7  Platelet Count (CBC)  117 (Result(s) reported on 26 Feb 2013 at 03:18PM.)  MCV 92  MCH 30.4  MCHC 33.1  RDW  15.8  28-Mar-14 05:43   WBC (CBC) 8.8  RBC (CBC)  3.69  Hemoglobin (CBC)  11.3  Hematocrit (CBC)  34.2  Platelet Count (CBC)  102  MCV 93  MCH 30.6  MCHC 33.0  RDW  15.6  Neutrophil % 90.5  Lymphocyte % 2.3  Monocyte % 5.5  Eosinophil % 0.0  Basophil % 1.7  Neutrophil #  8.0  Lymphocyte #  0.2  Monocyte # 0.5  Eosinophil # 0.0  Basophil # 0.1 (Result(s) reported on 27 Feb 2013 at 07:00AM.)  29-Mar-14 05:14   WBC (CBC) 10.1  RBC (CBC)  3.72  Hemoglobin (CBC)  11.4  Hematocrit (CBC)  34.5  Platelet Count (  CBC)  99  MCV 93  MCH 30.7  MCHC 33.1  RDW  15.7  Neutrophil % 90.7  Lymphocyte % 2.3  Monocyte % 6.6  Eosinophil % 0.0  Basophil % 0.4  Neutrophil #  9.2  Lymphocyte #  0.2  Monocyte # 0.7  Eosinophil # 0.0  Basophil # 0.0 (Result(s) reported on 28 Feb 2013 at 05:42AM.)  30-Mar-14 05:09   WBC (CBC)  11.9  RBC (CBC)  3.60  Hemoglobin (CBC)  10.8  Hematocrit (CBC)  33.4  Platelet Count (CBC)  124  MCV 93  MCH 30.0  MCHC 32.4  RDW  15.4  Neutrophil % 88.9  Lymphocyte % 3.5  Monocyte % 7.2  Eosinophil % 0.2  Basophil % 0.2  Neutrophil #  10.6  Lymphocyte #  0.4  Monocyte # 0.9  Eosinophil # 0.0  Basophil # 0.0 (Result(s) reported on 01 Mar 2013 at 05:52AM.)  31-Mar-14 04:42   WBC (CBC)  11.4  RBC (CBC)  3.66  Hemoglobin (CBC)  11.3  Hematocrit (CBC)  33.6  Platelet Count (CBC) 170  MCV 92  MCH 30.8  MCHC 33.5  RDW  15.5  Neutrophil % 80.8  Lymphocyte % 5.6  Monocyte % 12.7  Eosinophil % 0.7  Basophil % 0.2  Neutrophil #  9.2  Lymphocyte #  0.6  Monocyte #  1.5  Eosinophil #  0.1  Basophil # 0.0 (Result(s) reported on 02 Mar 2013 at 05:16AM.)  01-Apr-14 05:06   WBC (CBC) 10.3  RBC (CBC)  3.51  Hemoglobin (CBC)  10.6  Hematocrit (CBC)  32.1  Platelet Count (CBC) 203  MCV 91  MCH 30.2  MCHC 33.1  RDW  15.1  Neutrophil % 93.9  Lymphocyte % 3.0  Monocyte % 2.8  Eosinophil % 0.2  Basophil % 0.1  Neutrophil #  9.6  Lymphocyte #  0.3  Monocyte # 0.3  Eosinophil # 0.0  Basophil # 0.0 (Result(s) reported on 03 Mar 2013 at 06:36AM.)   PERTINENT RADIOLOGY STUDIES: XRay:    27-Mar-14 15:48, Abdomen Flat and Erect  Abdomen Flat and Erect   REASON FOR EXAM:    abdominal distension, vomiting, obstipation  COMMENTS:       PROCEDURE: DXR - DXR ABDOMEN 2 V FLAT AND ERECT  - Feb 26 2013  3:48PM     RESULT: There is a large amount of fecal material in the right colon and   transverse colon aswell as in the rectosigmoid region. There is air   within loops of small bowel t upper limits of normal. Respiratory motion   artifact limits visualization on the erect view but no gross free air is   seen. A decubitus view was not performed.    IMPRESSION:   1. Findings which can be consistent with constipation. No definite   obstruction or perforation evident. CT is available for further   assessment if the patient has persistent or worsening symptoms.  Dictation Site: 2        Verified By: Sundra Aland, M.D., MD  [place]:    29-Mar-14 11:54, [place] Color Flow Doppler Low Extrem Left (Leg)  [place] Color Flow Doppler Low Extrem Left (Leg)   REASON FOR EXAM:    edema  COMMENTS:       PROCEDURE: [place]  - US DOPPLER LOW EXTR LEFT  - Feb 28 2013 11:54AM     RESULT: Technique: Pearline Cables scale, Duplex color  flow doppler, and spectral   waveform  imaging was performed of the deep venous structures of the LEFT   lower extremity.    Findings: There is no evidence of increased echogenicity,   non-compressibility, abnormal waveform, abnormal grayscale, nor abnormal   color flow with  in the interrogated deep venous structures of the LEFT   lower extremity. There is appropriate response to Valsalva and   augmentation within the interrogated vessels.  IMPRESSION:     1. No sonographic evidence of a deep venous thrombus within the   interrogated vessels of the LEFT lower extremity.        Verified MA:UQJFHL W. COOPER, M.D., MD  LabUnknown:    27-Mar-14 15:35, Blood Culture  CEFOXITIN S  CT:    27-Mar-14 18:19, CT Abdomen and Pelvis Without Contrast  CT Abdomen and Pelvis Without Contrast   REASON FOR EXAM:    (1) abdominal pain/ fever distention ( ascitis? );   (2) same  COMMENTS:       PROCEDURE: CT  - CT ABDOMEN AND PELVIS W0  - Feb 26 2013  6:19PM     RESULT: History: Pain and fever.     Comparison study: Prior abdominal series 02/26/2013.    Findings: Standard nonenhanced CT obtained. Bilateral small pleural   effusions are present. Infiltrates are noted in both lung bases   consistent with pneumonia. Cardiomegaly. Mild pericardial thickening.   Small stable hepatic lesion no the periphery of the right lobe consistent   with tiny cysts. Small this is stable. Small cyst no the right or hepatic     lobe. Gallstones are present. Air is noted in the gallbladder.   Gallbladder wall thickening is noted. Adjacent fat plane stranding. These   findings are consistent with cholelithiasis with cholecystitis. Air   producing infection may be present present within the gallbladder. Air   within the gallbladder is new from 04/17/2011. Pancreas is normal. Spleen   is normal. There is nobiliary distention. Innumerable bilateral renal   cysts present. No hydronephrosis. Nonobstructing bilateral   nephrolithiasis present. Surgical clips right lower quadrant. Appendix   not densely identified. No inflammatory change in right lower quadrant.   No inflammatory change left lower quadrant. Large amount stool is present   throughout the colon there is umbilical hernia with  herniation of colon.   No evidence of strangulation . Distended loops of colon in the upper   abdomen and right upper quadrant, this may be from adjacent inflammatory   change related to the gallbladder. Mild distention secondary to the   umbilical hernia may be present. There is no free air. Bladder is     nondistended. Inguinal hernias with herniation of fat only noted.    IMPRESSION:   1. Cholelithiasis.  2. Findings consistent with cholecystitis. Air is noted within the within   the gallbladder. Air producing infection could present in this fashion.  3. Bibasilar pneumonia and small pleural effusions.  4. Umbilical hernia as described above.        Verified By: Osa Craver, M.D., MD   Hospital Course:  Hospital Course Admitted with abd pain nausea, ultimately found to have choleycytitis explaining his symptoms. He did have surgery which went well. Some post op confusion and wheezing and hyperglycemia. Improved with time, steriods and more insullin. Still is weak and somehwat ataxic so discharging to sn[facility] where he resides in Independent living. Note it took 35 min for dc tasks today   Condition on Discharge Satisfactory   DISCHARGE INSTRUCTIONS HOME MEDS:  Medication Reconciliation: Patient's Home Medications at Discharge:     Medication Instructions  januvia tablet 100 mg  1 tab(s) orally once a day   allopurinol tablet 100 mg  1 tab(s) orally once a day (at bedtime)   torsemide 20 mg oral tablet  1 tab(s) orally once a day   simvastatin tablet 80 mg  1 tab(s) orally once a day (at bedtime)   propranolol 20 mg oral tablet  1 tab(s) orally 2 times a day   allegra 180 mg oral tablet  1 tab(s) orally once a day, As Needed allergies   primidone 50 mg oral tablet  1 tab(s) orally 2 times a day   humalog mix 75/25 kwikpen 25 units-75 units/ml subcutaneous suspension  30 unit(s) subcutaneous , As Needed   acetaminophen-hydrocodone 325 mg-5 mg oral tablet  1 tab(s) orally  every 4 hours, As needed, pain   enoxaparin  40 milligram(s) subcutaneous once a day   zolpidem 5 mg oral tablet  1 tab(s) orally once a day (at bedtime), As needed, insomnia   fluticasone-salmeterol 250 mcg-50 mcg inhalation powder  1 puff(s) inhaled 2 times a day   tiotropium 18 mcg inhalation capsule  1 cap(s) inhaled once a day     Physician's Instructions:  Diet Carbohydrate Controlled (ADA) Diet   Activity Limitations As tolerated   Return to Work Not Applicable   Time frame for Follow Up Appointment per surgery and [facility] Medical Director   Electronic Signatures: Kirk Ruths (MD)  (Signed 01-Apr-14 07:12)  Authored: ADMISSION DATE AND DIAGNOSIS, CHIEF COMPLAINT/HPI, PERTINENT LABS, PERTINENT RADIOLOGY STUDIES, HOSPITAL COURSE, DISCHARGE INSTRUCTIONS HOME MEDS, PATIENT INSTRUCTIONS   Last Updated: 01-Apr-14 07:12 by Kirk Ruths (MD)

## 2015-03-25 NOTE — Consult Note (Signed)
Brief Consult Note: Diagnosis: acute cholecystitis, possible emphysematous CCY.   Patient was seen by consultant.   Consult note dictated.   Recommend to proceed with surgery or procedure.   Recommend further assessment or treatment.   Orders entered.   Discussed with Attending MD.   Comments: needs cardiac clearance, doubt troponin elevation significant in light of elevated creatinine and acute inflammatory process.  plan CCY 3/38 featuring Dr. Leanora Cover.  Case Discussed directly with Dr. Laurin Coder.  Electronic Signatures: Sherri Rad (MD)  (Signed 27-Mar-14 21:11)  Authored: Brief Consult Note   Last Updated: 27-Mar-14 21:11 by Sherri Rad (MD)

## 2015-03-25 NOTE — Consult Note (Signed)
PATIENT NAME:  Dean Garcia, Dean Garcia MR#:  [id number] DATE OF BIRTH:  06/26/1928  DATE OF CONSULTATION:  02/28/2013  REFERRING PHYSICIAN:  Dr. Burt Knack. CONSULTING PHYSICIAN:  Corey Skains, MD  REASON FOR CONSULTATION:  Left bundle branch block with minimal elevation of troponin with cardiovascular risks.   CHIEF COMPLAINT:  The patient has had abdominal pain with cholecystectomy.   HISTORY OF PRESENT ILLNESS:  This is an 81 year old male with a remote history of left bundle branch block and abnormal EKG with ventricular tachycardia with normal coronary arteries by cardiac catheterization as well as a workup of dizziness for which there was no evidence of significant rhythm disturbances. The patient has had an echocardiogram in the past showing normal LV systolic function with some valvular heart disease. He has had an abdominal pain with need for cholecystectomy for which he tolerated fairly well. He has not had any other further significant symptoms from the cardiac standpoint but has had a minimal elevation of troponin of less than 1. This is most consistent with demand ischemia and not evidence of acute myocardial infarction. The patient has had no further symptoms from the cardiac standpoint.   REVIEW OF SYSTEMS:  Remainder of review of systems negative for vision change, ringing in the ears, hearing loss, cough, congestion, heartburn, bloody stool, stomach pain, extremity pain, leg weakness, cramping in the buttocks, known blood clots, headaches, blackouts, dizzy spells, nosebleeds, congestion, trouble swallowing, frequent urination, urination at night, muscle weakness, numbness, anxiety, depression, skin lesions, or skin rashes.   PAST MEDICAL HISTORY:  1.  Tachycardia. 2.  Left bundle branch block.  3.  Dizziness.   FAMILY HISTORY:  No family members with early onset of cardiovascular disease.   SOCIAL HISTORY:  Currently denies alcohol or tobacco use.   ALLERGIES:  He has no known drug  allergies.   CURRENT MEDICATIONS:  As listed.   PHYSICAL EXAMINATION:  VITAL SIGNS: Blood pressure is 128/64 bilaterally, heart weight 70 upright, reclining, and regular.  GENERAL: He is a well appearing male in no acute distress.  HEENT:  No icterus, thyromegaly, ulcers, hemorrhage, or xanthelasma.  CARDIOVASCULAR:  Regular rate and rhythm with normal S1 and S2 without murmur, gallop, or rub. PMI is normal size and placement. Carotid upstroke normal without bruit. Jugular venous pressure is normal.  LUNGS:  Few basilar crackles with normal respirations.  ABDOMEN:  Soft with some diffuse tenderness due to cholecystectomy. Cannot assess hepatosplenomegaly at this time.  EXTREMITIES:  Shows 2+ radial, femoral, dorsal, pedal pulses with no lower extremity edema, cyanosis, clubbing, or ulcers.  NEUROLOGIC:  He is oriented to time, place, and person with normal mood and affect.   ASSESSMENT:  An 81 year old male with abdominal pain status post cholecystectomy, a history of dizziness without evidence of rhythm disturbances, a history of ventricular tachycardia without recurrence with normal coronary arteries and left bundle branch block with minimal elevation of troponin most consistent with demand ischemia rather than acute coronary syndrome.   RECOMMENDATIONS:  1.  No further cardiac workup at this time of possible myocardial infarction due to no further cardiac symptoms status post cholecystectomy.  2.  Echocardiogram for left ventricular systolic dysfunction and risk stratification and further treatment options.  3.  Continue recovery from cholecystectomy without restriction.  4.  Avoid aspirin due to concerns of bleeding complications.  5.  Ambulation and follow for other dizziness and other rhythm disturbances and treatment thereof as necessary.   ____________________________ Corey Skains, MD bjk:jm D: 02/28/2013  11:54:40 ET T: 02/28/2013 12:18:51 ET JOB#: [id number]  cc: Corey Skains, MD, <Dictator> Corey Skains MD ELECTRONICALLY SIGNED 03/01/2013 8:19

## 2015-03-25 NOTE — Op Note (Signed)
PATIENT NAME:  Dean Garcia, [name] MR#:  [id number] DATE OF BIRTH:  [birth year]  DATE OF PROCEDURE:  02/27/2013  OPERATION PERFORMED:  Laparoscopic cholecystectomy.   PREOPERATIVE DIAGNOSIS:  Gangrenous cholecystitis.   POSTOPERATIVE DIAGNOSIS:  Gangrenous cholecystitis.  SURGEON:  Consuela Mimes, M.D.   ANESTHESIA:  General.   PROCEDURE IN DETAIL:  The patient was placed supine on the operating room table and prepped and draped in the usual sterile fashion.  A 15 mmHg CO2 pneumoperitoneum was created via a Veress needle in the infraumbilical position and this was replaced with a 5 mm trocar and a 30 degree angled laparoscope.  Remaining trocars were placed under direct visualization.  The patient was noted to have a markedly distended gallbladder with some green ascites and significant fibrinous adhesions to the visceral surface.  There were exudative adhesions as well.  I took down some omentum that was adherent to the anterior abdominal wall above the right colonic gutter in order to place the two lateral ports and placed 11 mm trocar port in the midline above the umbilicus.  I drained the gallbladder of 60 mL of bile in order to grasp the fundus and retract it superiorly and ventrally and then I took down the fibrinous adhesions to the visceral surface of the gallbladder bluntly and with the aid of the electrocautery.  There were multiple areas of patchy dead gallbladder wall necrosis, but the gallbladder had not perforated yet.  Ultimately, the infundibulum was dissected out and retracted laterally opening up the triangle of Calot.  The cystic duct was identified and doubly clipped and divided.  The cystic artery in an anterior position or a typical location was never identified in this area, was divided with the electrocautery and then ultimately a small posterior cystic artery was encountered with the electrocautery and this was then triply clipped after it had been divided.  The gallbladder was  removed from the liver bed with electrocautery, placed in an Endo Catch bag and extracted from the abdomen via the epigastric port.  This port site fascia required some enlargement due to the thickness of the gallbladder wall.  The peritoneum was temporarily desufflated, but the cannulas were left in and the linea alba was closed with a running 0 PDS suture and the peritoneum was then re-insufflated and blood was suctioned out of the gallbladder bed and 2 bleeders were seen in the gallbladder bed, one being venous and the other being arterial.  Both of these were controlled with the electrocautery as were small oozing areas and the right upper quadrant was then irrigated with copious amounts of warm normal saline which was all suctioned clear and after hemostasis was deemed excellent the clips were seen to be secure and there was no evidence of bile leakage.  The peritoneum was desufflated and decannulated and all four skin sites were closed with subcuticular 5-0 Monocryl and suture strips.  The patient tolerated the procedure well.  There were no complications.     ____________________________ Consuela Mimes, MD wfm:ea D: 02/27/2013 13:17:39 ET T: 02/27/2013 23:04:22 ET JOB#: [id number]  cc: Consuela Mimes, MD, <Dictator> Consuela Mimes, MD Consuela Mimes MD ELECTRONICALLY SIGNED 03/01/2013 10:58

## 2015-03-25 NOTE — Consult Note (Signed)
PATIENT NAME:  Dean Garcia, Dean Garcia Khair MR#:  [id number] DATE OF BIRTH:  January 11, 1928  DATE OF CONSULTATION:  02/26/2013  CONSULTING PHYSICIAN:  Elta Guadeloupe A. Marina Gravel, MD  REASON FOR CONSULTATION: Abdominal pain, cholelithiasis, possible cholecystitis.   HISTORY OF PRESENT ILLNESS: The patient is an 81 year old white male with a history of diabetes, hypertension, peripheral vascular disease, chronic kidney insufficiency, who was admitted to the hospital with significant abdominal pain which began Tuesday early morning. This was followed by nausea and vomiting of several episodes. The patient had anorexia. The patient had a low-grade fever 100.3. Denies cough. No dysuria. No yellow jaundice. No previous episodes of such. Of note, the patient states that he has had significant weakness with difficulty walking since the onset of his symptoms on Tuesday morning. In the Emergency Room, the patient had a work-up consisting of urinalysis which was negative, chest x-ray, which was unremarkable and leukocytosis. As such, he has admitted to the medical service and started on Zosyn as antibiotics. He was given a gram of Rocephin in the Emergency Room. Concern initially was for spontaneous bacterial peritonitis. The patient does have a strong history of chronic alcohol use in the year 2011 was admitted to the hospital briefly following a fall. He had several rib fractures at that time. A CT scan without oral or IV contrast was performed and has been reviewed. It demonstrates inflammatory changes around the gallbladder with possible early emphysematous changes. The patient had a full dinner after the CT scan. I was contacted at 7:10 p.m. The patient was found to have an elevated troponin as well and is being worked up with cardiology and medicine involved. The patient denies any chest pain or shortness of breath.   ALLERGIES: DEMEROL.  MEDICATIONS: Torsemide 20 mg by mouth daily, simvastatin 80 mg by mouth daily, ranitidine 150 mg by mouth  daily, propranolol 20 mg by mouth b.i.d. for essential tremor, primidone 50 mg by mouth b.i.d., Januvia 100 mg by mouth daily, Humalog 75/25 thirty units subcutaneous once a day, aspirin 81 mg by mouth once a day, allopurinol 100 mg by mouth once a day, Allegra 180 mg per mouth daily.   PAST MEDICAL HISTORY: Significant for:  1.  Type 1 diabetes.  2.  Hypertension.  3.  Hypercholesterolemia,  4.  Gout.  5.  History of colonic polyps.  6.  History of benign essential tremor.  7.  History of radiation proctitis.  8.  History of carotid artery occlusive disease, status post left carotid endarterectomy in the year 2008.  9.  History of mitral valve prolapse.  10.  History of prostate cancer and bladder cancer.   PAST SURGICAL HISTORY:  1. Open appendectomy.  2.  Inguinal herniorrhaphy.  3.  Cystoscopy with bladder tumor removal.  4.  Prostate radiation.   SOCIAL HISTORY: The patient is married, smokes a pipe and drinks several cocktails a day. Heavy alcohol use in the past. He is retired Civil engineer, contracting.   REVIEW OF SYSTEMS: As described above and as per 10-point review, otherwise unremarkable.   PHYSICAL EXAMINATION: VITAL SIGNS: Temperature the Emergency Room is 100.6, temperature at 5:00 p.m. was 97.6, pulse 81, respiratory rate 18, blood pressure 155/68, room air saturation 92%.  GENERAL: The patient is nontoxic appearing, lying in bed. He is conversant, alert and oriented x 4.  EYES:  Pupils are equal and reactive to light. Conjunctivae clear. No evidence of scleral icterus.  HEAD AND NECK: Unremarkable. No thyromegaly.  LUNGS: Clear bilaterally. Normal respiratory effort.  HEART: Regular rate and rhythm without obvious murmurs.  ABDOMEN: Demonstrates mild distention. There is a small umbilical hernia which is reducible and chronic. There is a right lower quadrant scar. There is significant tenderness with positive Murphy sign of the right upper quadrant.  RECTAL: Deferred.  SKIN:  Warm and well perfused.  Nora: Cranial nerve examination is normal.  PSYCHIATRIC: Appropriate mood, judgment, affect and mentation.  HEENT: Above. Hearing is intact. Facies are symmetrical.   LABORATORY, DIAGNOSTIC AND RADIOLOGIC DATA: Urinalysis negative. Total bilirubin 1, alkaline phosphatase 52, AST 27, ALT 14, glucose 196, BUN 31, creatinine 1.96, sodium 136, potassium 3.6, CO2 of 26, lipase 61, total calcium of 7.4. White count is 11.3, hemoglobin 11.2, hematocrit 33.7, platelet count 117,000. INR is 1.2.   Review of chest x-ray demonstrates  no edema, infiltrate, effusion or pneumothorax.   Evaluation of his 2-way flat abdominal x-ray showed evidence of constipation, no free air.   Detailed review of CT scan, there was no oral or IV contrast administered. There is mild cardiomegaly, small cysts in the right lobe of the liver. Concerning the gallbladder, there was gallbladder wall thickening, adjacent pericholecystic fluid and fat stranding. There is air within the wall of the gallbladder. Pancreas appears normal, as well as the spleen. The liver is unremarkable except for the cyst. There is a small umbilical hernia. There is a large degree of constipation. No free air. Bilateral fat-containing hernias.   IMPRESSION: This is an 81 year old white male with hypertension, type 1 diabetes and acute calculus cholecystitis with possibly emphysematous changes. At present, he is undergoing cardiac evaluation. He will require a cholecystectomy within the next 24 hours.   PLAN: I discussed with him laparoscopic cholecystectomy. I went over with him briefly the risks of surgery including that of bleeding, infection, bile duct injury, need for conversion to open operation and all of his questions were answered. I have scheduled him for the operating room for the morning. I will discuss his care with Dr. Leanora Cover, who will assume his care in the morning. I discussed with Dr. Laurin Coder, internist. The  plan and all of the questions were answered.      ____________________________ Jeannette How Marina Gravel, MD mab:cc D: 02/26/2013 22:39:19 ET T: 02/26/2013 23:14:37 ET JOB#: [id number]  cc: Elta Guadeloupe A. Marina Gravel, MD, <Dictator> Ocie Cornfield. Ouida Sills, MD Javier Docker Ubaldo Glassing, MD  Hortencia Conradi MD ELECTRONICALLY SIGNED 02/28/2013 10:32

## 2015-03-25 NOTE — H&P (Signed)
PATIENT NAME:  Dean Garcia, Dean Garcia MR#:  [id number] DATE OF BIRTH:  1928-10-19  DATE OF ADMISSION:  02/26/2013  ADDENDUM:  I have discussed the case with Dr. Marina Gravel, general surgery.  Dr. Marina Gravel is going to try to take the patient for surgery tomorrow, unable to take him today as the patient has received some food, but also he needs surgical clearance. At this moment, we cannot clear him surgically as his first troponin is elevated. We need to follow up on results. It is very likely the patient had elevation of the troponin due to septic process due to acute kidney injury; but until proven otherwise, since the patient has multiple risk factors including diabetes, hypertension, we need to do a full clearance as the patient is more complicated. We are going to add a cardiology consultation due to elevation of the troponin and an echocardiogram. Other than that, the patient is okay. Since he has edemas, the echocardiogram should be valuable.    For coverage, monotherapy with Zosyn should be sufficient for the possibility of anaerobes. If double coverage for anaerobes is considered at some point, we can add metronidazole.  For now, we are going to treat it this way, get cultures.  If the patient becomes septic, we are going to add metronidazole.   TIME SPENT:   I spent about 60 minutes already with this patient.   ____________________________ Ewing Sink, MD rsg:cb D: 02/26/2013 21:28:47 ET T: 02/26/2013 21:37:09 ET JOB#: [id number]  cc: Meadow Sink, MD, <Dictator> Malikye Reppond America Brown MD ELECTRONICALLY SIGNED 02/27/2013 13:41

## 2015-03-25 NOTE — H&P (Signed)
DATE OF BIRTH:  May 29, 1928  DATE OF ADMISSION:  02/26/2013  REFERRING PHYSICIAN:  Dr. Graciella Freer  PRIMARY CARE PHYSICIAN:  Dr.  Frazier Richards   REASON FOR ADMISSION:  Fever, abdominal pain, nausea, vomiting.  HISTORY OF PRESENT ILLNESS: The patient is a nice 81 year old gentleman, who has history of multiple medical problems including diabetes, hypertension, hyperlipidemia, gout, radiation proctitis, history of prostate cancer in 1994, mitral valve prolapse and peripheral vascular disease. The patient comes with a history of being in his normal state of health up until today at 3:00 in the morning, when he woke up with significant abdominal pain located diffusely around his abdomen, more prominent in the lower quadrants of the abdomen. The patient also started developing significant nausea, and started vomiting about an hour after. He states that he vomited at least 6 or 7 times today, but nothing since around 8:00 in the morning. He is not eating much, but he is very hungry. The patient denies having any major problems with his abdomen, although it is significantly distended. The patient states that this has been going on for years. The patient was evaluated by Dr. Jasmine December, who found him to have a fever and elevated white blood count, but not a significant source of infection. So far on his workup, his chest x-ray has been negative. His urinalysis is negative. Clinically, there were no other signs of infection that he can find out.   As far as my exam and diagnostics, the patient has significant abdominal distention, mild abdominal pain. Flat x-ray of the abdomen shows significant amount of stool, constipation, but no clear abdominal obstruction. The patient does not have diarrhea, and he has not had a bowel movement for at least 3 to 4 days. Due to all this, I am going to order a CT scan of the abdomen and follow up on the results. The patient has significant abdominal distention, likely  ascites. He is a drinker, and has been a heavy drinker for many years. He has an ascitic wave on my physical exam, for what I am going to rule out the possibility of SBP. If the CT scan shows a significant amount of ascites, we are going to order a tap. He has been given already 1 gram of Rocephin, which I am going to continue, since that is a good treatment for SBP. We have been asked to admit the patient for continuation of evaluation, and also because he had a positive troponin.    REVIEW OF SYSTEMS: CONSTITUTIONAL:  Positive fatigue today, but not previous. No fever up until today. He felt warm, and here at the ER his temperature is 100.6. No significant weight loss or weight gain. He has significant edema of the lower extremity going on for over 10 years.  EYES:  No blurry vision, double vision or changes of eyesight.  EARS, NOSE, THROAT:  No tinnitus. No difficulty swallowing. No nasal congestion.  RESPIRATORY: No wheezing. No hemoptysis. Positive mild cough, which is chronic. The patient denies COPD. He smokes a pipe.  CARDIOVASCULAR:  No chest pain or orthopnea. No arrhythmias. Positive edema, which is chronic for over 10 years. No palpitations, no syncopal episodes. No paroxysmal nocturnal dyspnea.  GASTROINTESTINAL:  No nausea prior to today at 3:00 a.m. He started having nausea and vomiting, up to 6 episodes. No diarrhea. Positive constipation. No jaundice. No IBS. Positive GERD. No hemorrhoids active at this moment. No hematuria. No changes in urine frequency or kidney stones.  GENITOURINARY:  Male  patient, has history of prostate cancer, status post radiation and prostatectomy.  ENDOCRINOLOGY:  No polyuria. No nocturia. No increased sweating. No heat or cold intolerance.  HEMATOLOGIC AND LYMPHATIC:  No significant anemia, easy bruising or bleeding.  MUSCULOSKELETAL: No significant gout episodes lately, but he has chronic gout. No joint pains.  NEUROLOGIC:  No numbness or tingling. No CVA or  TIA.   PSYCHIATRIC:  No significant insomnia, depression or agitation.   PAST MEDICAL HISTORY: 1.  Type 2 diabetes, insulin-dependent.  2.  Hypertension.  3.  Hypercholesterolemia.  4.  Benign essential tremor.  5.  Gout.  6.  Colon polyps.  7.  Radiation proctitis.  8.  Peripheral vascular disease.  9.  Diabetic neuropathy.  10.  Chronic kidney disease stage III.  11.  History of carotid stenosis, status post left carotid endarterectomy.  12.  Mitral valve prolapse.  13.  History of prostate cancer and bladder cancer.   ALLERGIES: The patient is allergic to DEMEROL.   FAMILY HISTORY:  Positive for stroke, hypertension, diabetes in parents.  SOCIAL HISTORY: The patient is married, lives with his wife. He used to be a Civil engineer, contracting, and he is retired now. He drinks 2 to 3 drinks at night. He used to drink heavily before. He smokes a pipe, he is not interested in quitting.   PAST SURGICAL HISTORY:   1.  Positive inguinal hernia repair.  2.  Appendectomy.  3.  Bladder tumor removal.  4.  Prostate cancer surgery.  5.  Left carotid endarterectomy.   CURRENT MEDICATIONS INCLUDE: 1.  Torsemide 20 mg once daily. 2.  Simvastatin 80 mg once daily. 3.  Ranitidine 150 mg once daily. 4.  Propranolol 20 mg twice daily for essential tremor.  5.  Primidone 50 mg twice daily.  6.  Januvia 100 mg once daily.  7.  Humalog 75/25, 30 units subcutaneously  a day.  8.  Aspirin 81 mg once daily. 9.  Allopurinol 100 mg once daily. 10.  Allegra 180 mg once daily.   PHYSICAL EXAMINATION: VITAL SIGNS:  Blood pressure 139/61, pulse 65, respirations 20 to 24, pulse ox 95% to 100%, temperature 100.6.  GENERAL: The patient is alert, oriented x 3. No acute distress. No respiratory distress. Hemodynamically stable.  HEENT: Pupils are equal and reactive. Extraocular movements intact. Mucosa is dry. Anicteric sclerae, pink conjunctivae. No oral lesions. No oropharyngeal exudates.  NECK: Supple. No JVD.  No thyromegaly. No adenopathy. No carotid bruits. No masses. Incision for carotid endarterectomy is already healed. No rigidity.  CARDIOVASCULAR:  Regular rate and rhythm. No murmurs, rubs or gallops are appreciated at this moment. No displacement of PMI. No tenderness to palpation of anterior chest wall.  LUNGS:  Mostly clear, with decreased respiratory sounds in both bases, but no rales or crepitus. No use of accessory muscles. No dullness to percussion.  ABDOMEN: Very distended, protruded, with ascitic wave. Unable to palpate hepatosplenomegaly, as the abdomen is very tight. Positive tenderness in lower quadrants for the most, but no rebound.  GENITAL EXAM:  Negative for external lesions.  EXTREMITIES:  No edema, no cyanosis, no clubbing of the upper extremities. Positive edema of the lower extremities, +3, which is chronic. There is some erythema at the level of the toes, with a little bit of breakdown in the web spaces, but no significant ulcerations or signs of infection.  SKIN:  Without tinea, rashes or petechiae.  NEUROLOGIC:  Cranial nerves II through XII intact. Strength is 5/5 in  4 extremities. No focal findings.  PSYCHIATRIC:  Mood normal, without any signs of depression, anxiety or agitation.  LYMPHATIC:  Negative for lymphadenopathy in the neck, supraclavicular areas or groin areas.  MUSCULOSKELETAL: No significant joint deformity or joint effusions.   RESULTS:  Chest x-ray without any signs of problems.  Abdomen:  Findings consistent with constipation. No obstruction or perforation. Creatinine is 1.96, his baseline creatinine is between 1.1 to 1.4, glucose is 196, BUN is 31, potassium 3.6, sodium 136, lipase is 61, calcium is 7.4. AST and ALT are normal. Albumin is 2.4, total CK is 164. Troponin is 0.12. Hemoglobin is 11.2, white count is 11.3, platelets 117. UA:  No signs of urinary infection, positive protein, 100 mg/dL. EKG: Normal sinus rhythm with some interventricular conduction  abnormality and possible right bundle branch, which was seen in the past.   ASSESSMENT AND PLAN:  An 81 year old gentleman who presented with a chief complaint of abdominal pain, nausea, vomiting, with 6 episodes of vomiting, increased tremors. Last bowel movement several days ago, with also history of diabetes, hypertension, hyperlipidemia, gout, prostate cancer, multiple surgeries including prostatectomy and removal of bladder cancer, peripheral neuropathy and peripheral artery disease. He comes with the problems mentioned above.   1.  Abdominal pain. The patient has diffuse abdominal pain with fever, increased white blood count, ascitic wave. At this moment, my main suspicion is spontaneous bacterial peritonitis, as the patient is a significant drinker. He does not have a diagnosis of cirrhosis, but my physical exam indicates that he has significant ascites for several years, for what I am going to order a CT scan of the abdomen to evaluate this. If there is significant amount of ascites, we are going to do a paracentesis. For now, the patient will benefit also from being on the CIWA protocol and monitor all these issues. If a negative CT scan, we are going to reconsider reculturing and reconsidering looking closely to his lungs, but he does have a fever, which could be also related to acute gastroenteritis/viral process.   2.  Nausea, vomiting. Again, cannot rule out the possibility of acute gastroenteritis at this moment, or abdominal obstruction CT scan is being ordered. IV fluids to keep him hydrated.   3.  Acute kidney injury. His creatinine is usually around 1.1 to 1.4. Right now it is 1.96, with elevated BUN at 31. IV fluids are started, 1 liter, and evaluate the need of more.   4.  Increased white blood count, likely due to infectious process, most likely intra-abdominal. Consider spontaneous bacterial peritonitis as the main cause. The patient has received 1 gram of ceftriaxone ordered by the  ER physician, for what we are going to continue that medication, since it will be good coverage for SBP.   5.  Elevated troponin. I do not think this is a cardiac-related elevation of the troponin. I think it is mostly related to his acute kidney injury. With a creatinine of 1.96, this would be a troponin leak. We are going to monitor cardiac enzymes every 8 hours and keep the patient on telemetry monitor.   6.  Diabetes. The patient is diabetic. At this moment, his blood sugars are elevated. We are going to do insulin sliding scale, and if he is able to eat, we are going to continue his normal medications.   7.  Benign essential tremor. The patient is taking propranolol. Patient states that his tremor has been increased in the last couple of days. This could be  related to exacerbation due to infectious process or dehydration, for what we are going to just continue the propranolol.   8.  Hypertension. His blood pressure is borderline high, but stable so far, for what we are going to just continue his regular blood pressure medications.   9.  Dyslipidemia. Continue treatment with statin.   10. Gout. At this moment, there are no signs of exacerbation. I am going to continue his allopurinol 100 mg at bedtime.   11.  Deep vein thrombosis prophylaxis with heparin. I do not think at this moment there is any need for anticoagulation. Actually, I am going to check his coags, as the patient could have significant liver disease from his drinking.   12.  Gastrointestinal prophylaxis with proton pump inhibitor.   13.  The patient is a FULL CODE.  The patient is a patient also  of  Dr. Ouida Sills, which I am going to transfer the care later on today.   14.  Edema. The patient has significant edema. This could be also related to ascites or chronic peripheral edema. We are going to hold on torsemide just because the patient is in acute kidney injury.   15.  Anemia of chronic disease, is stable.   16.   Thrombocytopenia. This is likely due to liver disease. Has been in the 130s in the past. Right now it is 117, which is even lower than before.   I spent about one hour with this patient today.    ____________________________ [place] Sink, MD rsg:mr D: 02/26/2013 18:22:31 ET T: 02/26/2013 19:05:39 ET JOB#: [id number]  cc: Thomson Sink, MD, <Dictator> Nakhi Choi America Brown MD ELECTRONICALLY SIGNED 02/27/2013 13:41

## 2015-03-26 NOTE — Op Note (Signed)
PATIENT NAME:  Dean Garcia, Dean Garcia MR#:  [id number] DATE OF BIRTH:  1928/02/07  DATE OF PROCEDURE:  04/08/2014  PREOPERATIVE DIAGNOSIS: Large right nasal/lip/cheek carcinoma.   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   PROCEDURES:  1. Radical resection, right nasal/lip/cheek carcinoma.  2. Pedicled myocutaneous flap.  3. Composite graft from right ear to the nose.  4. Nasal advancement rotation flap.  5. Cheek advancement rotation flap.   SURGEON: Janalee Dane, M.D.   DESCRIPTION OF PROCEDURE: The patient was placed in a supine position on the operating room table. After MAC anesthesia had been induced, the patient was turned 90 degrees counterclockwise from anesthesia, and the area for resection was examined under operating room lighting and magnification. The planned resection was marked and locally anesthetized with an infraorbital nerve block and then infiltrative anesthesia in the area of the resection. The nose was packed on the right side with cotton, and then after prepping and draping the patient in the usual fashion, a #15C blade was used to make the skin resection incisions. Sharp dissection was carried out three dimensionally, and there were suspicious margins inferiorly and at the deep margin of the resection which went through-and-through the fibrous connection between the lateral lower cartilage and the piriform aperture. It was also suspicious in the deep subcutaneous tissue in the ala. Multiple frozen sections were taken from deep posterior, deep centrally and deep anterior, as well as inferior margins. The initial reading from the pathologist (Dr. Luana Shu) was that the deep central and deep anterior were positive; therefore, additional margins were taken, necessitating a through-and-through defect from the piriform aperture medially for approximately 1.5 cm. Additional margins were also taken inferiorly and were subsequently found to be  negative at the margin most distal from the carcinoma. At this point, with complete removal verified by microscopic sections, the reconstruction was begun. For the internal lining and structural support, a composite graft was taken from the right concha cavum and concha cymba to allow for spanning from the remaining remnant of the lateral crus of the lower lateral cartilage to the piriform aperture. This graft was harvested in the standard fashion and placed in the defect, sutured into position with the skin sutured to the remaining mucosal remnant around the defect using 5-0 chromic on a P2 needle. The cartilage was inset with several 5-0 PDS sutures on a P2 needle. After the internal and structural lining had been inset, attention was directed to external lining. This was achieved with an advancement rotation flap from the nose to complete the medial portion and from the cheek to complete the lateral portion. Beginning medially, the bilobed flap was raised in a subnasal SMAS plane. After the flap had been elevated, it was interpolated into the defect, closing the B flap harvest site first with 5-0 Vicryl and then progressively closing to complete approximately one-half of the closure of the defect. A similar flap was raised from the medial cheek, and this was used to complete the lateral external lining after it was inset beginning with the harvest site from the B flap. At this point, attention was directed to the right ear, and a pedicled myocutaneous flap based on the posterior auricular muscle and vascular pedicle was raised meticulously and this flap was pulled through the defect and inset with multiple interrupted 6-0 nylon. The defect from posteriorly was then advanced and closed with 4-0 Vicryl and 5-0 fast-absorbing gut sutures. Bacitracin was used to dress the wound. The  patient was returned to anesthesia, allowed to emerge from anesthesia in the operating room and taken to the recovery room in stable  condition. There were no complications. Estimated blood loss 15 mL.   ____________________________ J. Nadeen Landau, MD jmc:gb D: 04/08/2014 13:35:24 ET T: 04/09/2014 00:14:22 ET JOB#: [id number]  cc: Janalee Dane, MD, <Dictator> Nicholos Johns MD ELECTRONICALLY SIGNED 05/02/2014 17:25

## 2015-04-12 DIAGNOSIS — E538 Deficiency of other specified B group vitamins: Secondary | ICD-10-CM | POA: Insufficient documentation

## 2015-05-09 ENCOUNTER — Ambulatory Visit
Admission: RE | Admit: 2015-05-09 | Discharge: 2015-05-09 | Disposition: A | Discharge status: Home / Self Care | Payer: Medicare Other | Source: Ambulatory Visit | Attending: Specialist | Admitting: Specialist

## 2015-05-09 DIAGNOSIS — R918 Other nonspecific abnormal finding of lung field: Secondary | ICD-10-CM | POA: Insufficient documentation

## 2015-05-09 DIAGNOSIS — J984 Other disorders of lung: Secondary | ICD-10-CM | POA: Insufficient documentation

## 2015-05-09 DIAGNOSIS — J479 Bronchiectasis, uncomplicated: Secondary | ICD-10-CM | POA: Diagnosis not present

## 2015-05-09 DIAGNOSIS — R911 Solitary pulmonary nodule: Secondary | ICD-10-CM | POA: Diagnosis present

## 2015-06-24 ENCOUNTER — Other Ambulatory Visit: Payer: Self-pay | Admitting: Specialist

## 2015-06-24 DIAGNOSIS — R911 Solitary pulmonary nodule: Secondary | ICD-10-CM

## 2015-08-23 ENCOUNTER — Ambulatory Visit
Admission: RE | Admit: 2015-08-23 | Discharge: 2015-08-23 | Disposition: A | Discharge status: Home / Self Care | Payer: Medicare Other | Source: Ambulatory Visit | Attending: Specialist | Admitting: Specialist

## 2015-08-23 DIAGNOSIS — A319 Mycobacterial infection, unspecified: Secondary | ICD-10-CM | POA: Insufficient documentation

## 2015-08-23 DIAGNOSIS — J90 Pleural effusion, not elsewhere classified: Secondary | ICD-10-CM | POA: Diagnosis not present

## 2015-08-23 DIAGNOSIS — R911 Solitary pulmonary nodule: Secondary | ICD-10-CM | POA: Diagnosis present

## 2015-08-29 ENCOUNTER — Other Ambulatory Visit: Payer: Self-pay | Admitting: Specialist

## 2015-08-29 DIAGNOSIS — R911 Solitary pulmonary nodule: Secondary | ICD-10-CM

## 2015-10-30 ENCOUNTER — Emergency Department
Admission: EM | Admit: 2015-10-30 | Discharge: 2015-10-30 | Disposition: A | Discharge status: Home / Self Care | Payer: Medicare Other | Attending: Emergency Medicine | Admitting: Emergency Medicine

## 2015-10-30 ENCOUNTER — Encounter: Payer: Self-pay | Admitting: Emergency Medicine

## 2015-10-30 DIAGNOSIS — Z87891 Personal history of nicotine dependence: Secondary | ICD-10-CM | POA: Diagnosis not present

## 2015-10-30 DIAGNOSIS — T450X1A Poisoning by antiallergic and antiemetic drugs, accidental (unintentional), initial encounter: Secondary | ICD-10-CM | POA: Insufficient documentation

## 2015-10-30 DIAGNOSIS — I10 Essential (primary) hypertension: Secondary | ICD-10-CM | POA: Diagnosis not present

## 2015-10-30 DIAGNOSIS — F10120 Alcohol abuse with intoxication, uncomplicated: Secondary | ICD-10-CM | POA: Diagnosis not present

## 2015-10-30 DIAGNOSIS — R630 Anorexia: Secondary | ICD-10-CM | POA: Insufficient documentation

## 2015-10-30 DIAGNOSIS — E119 Type 2 diabetes mellitus without complications: Secondary | ICD-10-CM | POA: Insufficient documentation

## 2015-10-30 DIAGNOSIS — R4182 Altered mental status, unspecified: Secondary | ICD-10-CM | POA: Diagnosis not present

## 2015-10-30 DIAGNOSIS — F10920 Alcohol use, unspecified with intoxication, uncomplicated: Secondary | ICD-10-CM

## 2015-10-30 DIAGNOSIS — R5383 Other fatigue: Secondary | ICD-10-CM | POA: Insufficient documentation

## 2015-10-30 DIAGNOSIS — Y9389 Activity, other specified: Secondary | ICD-10-CM | POA: Diagnosis not present

## 2015-10-30 DIAGNOSIS — R419 Unspecified symptoms and signs involving cognitive functions and awareness: Secondary | ICD-10-CM

## 2015-10-30 DIAGNOSIS — Y998 Other external cause status: Secondary | ICD-10-CM | POA: Insufficient documentation

## 2015-10-30 DIAGNOSIS — Y9289 Other specified places as the place of occurrence of the external cause: Secondary | ICD-10-CM | POA: Diagnosis not present

## 2015-10-30 HISTORY — DX: Pure hypercholesterolemia, unspecified: E78.00

## 2015-10-30 HISTORY — DX: Tremor, unspecified: R25.1

## 2015-10-30 HISTORY — DX: Type 2 diabetes mellitus without complications: E11.9

## 2015-10-30 HISTORY — DX: Gout, unspecified: M10.9

## 2015-10-30 HISTORY — DX: Essential (primary) hypertension: I10

## 2015-10-30 LAB — CBC WITH DIFFERENTIAL/PLATELET
BASOS PCT: 0 %
Basophils Absolute: 0 10*3/uL (ref 0–0.1)
Eosinophils Absolute: 0.2 10*3/uL (ref 0–0.7)
Eosinophils Relative: 2 %
HCT: 39.4 % — ABNORMAL LOW (ref 40.0–52.0)
HEMOGLOBIN: 13.2 g/dL (ref 13.0–18.0)
Lymphocytes Relative: 10 %
Lymphs Abs: 0.9 10*3/uL — ABNORMAL LOW (ref 1.0–3.6)
MCH: 32.8 pg (ref 26.0–34.0)
MCHC: 33.5 g/dL (ref 32.0–36.0)
MCV: 97.8 fL (ref 80.0–100.0)
Monocytes Absolute: 1.3 10*3/uL — ABNORMAL HIGH (ref 0.2–1.0)
Monocytes Relative: 15 %
NEUTROS ABS: 6.7 10*3/uL — AB (ref 1.4–6.5)
NEUTROS PCT: 73 %
Platelets: 180 10*3/uL (ref 150–440)
RBC: 4.03 MIL/uL — AB (ref 4.40–5.90)
RDW: 13.7 % (ref 11.5–14.5)
WBC: 9.2 10*3/uL (ref 3.8–10.6)

## 2015-10-30 LAB — BASIC METABOLIC PANEL
Anion gap: 15 (ref 5–15)
BUN: 58 mg/dL — ABNORMAL HIGH (ref 6–20)
CHLORIDE: 99 mmol/L — AB (ref 101–111)
CO2: 24 mmol/L (ref 22–32)
Calcium: 8.4 mg/dL — ABNORMAL LOW (ref 8.9–10.3)
Creatinine, Ser: 2.48 mg/dL — ABNORMAL HIGH (ref 0.61–1.24)
GFR calc non Af Amer: 22 mL/min — ABNORMAL LOW (ref >60)
GFR, EST AFRICAN AMERICAN: 26 mL/min — AB (ref >60)
Glucose, Bld: 118 mg/dL — ABNORMAL HIGH (ref 65–99)
POTASSIUM: 3.3 mmol/L — AB (ref 3.5–5.1)
Sodium: 138 mmol/L (ref 135–145)

## 2015-10-30 LAB — TROPONIN I

## 2015-10-30 LAB — ETHANOL: ALCOHOL ETHYL (B): 207 mg/dL — AB (ref <5)

## 2015-10-30 MED ORDER — SODIUM CHLORIDE 0.9 % IV BOLUS (SEPSIS)
500.0000 mL | Freq: Once | INTRAVENOUS | Status: AC
  Administered 2015-10-30: 500 mL via INTRAVENOUS

## 2015-10-30 NOTE — ED Provider Notes (Signed)
[facility] Emergency Department Provider Note  ____________________________________________  Time seen: 7:20 PM  I have reviewed the triage vital signs and the nursing notes.   HISTORY  Chief Complaint Fatigue; Alcohol Intoxication; Altered Mental Status; and Anorexia    HPI Dean Garcia is a 81 y.o. male brought in by EMS for decreased level of consciousness. Patient was drinking multiple martinis that were reported to be almost entirely straight gin. Wife also reports that he accidentally took 2 of his daily Allegra today. Denies any symptoms, no chest pain shortness of breath numbness [place] weakness vomiting abdominal pain or back pain.     Past Medical History  Diagnosis Date  . High cholesterol   . Diabetes ([place])   . Hypertension   . Gout   . Tremor      There are no active problems to display for this patient.    Past Surgical History  Procedure Laterality Date  . Appendectomy       No current outpatient prescriptions on file.   Allergies Review of patient's allergies indicates no known allergies.   No family history on file.  Social History Social History  Substance Use Topics  . Smoking status: Former Smoker    Types: Pipe    Quit date: 10/29/2010  . Smokeless tobacco: None  . Alcohol Use: Yes    Review of Systems  Constitutional:   No fever or chills. No weight changes Eyes:   No blurry vision or double vision.  ENT:   No sore throat. Cardiovascular:   No chest pain. Respiratory:   No dyspnea or cough. Gastrointestinal:   Negative for abdominal pain, vomiting and diarrhea.  No BRBPR or melena. Genitourinary:   Negative for dysuria, urinary retention, bloody urine, or difficulty urinating. Musculoskeletal:   Negative for back pain. No joint swelling or pain. Skin:   Negative for rash. Neurological:   Negative for headaches, focal weakness or numbness. Psychiatric:  No anxiety or depression.   Endocrine:  No  hot/cold intolerance, changes in energy, or sleep difficulty.  10-point ROS otherwise negative.  ____________________________________________   PHYSICAL EXAM:  VITAL SIGNS: ED Triage Vitals  Enc Vitals Group     BP 10/30/15 1929 102/52 mmHg     Pulse Rate 10/30/15 1929 53     Resp 10/30/15 1929 22     Temp 10/30/15 1928 97.2 F (36.2 C)     Temp Source 10/30/15 1928 Oral     SpO2 10/30/15 1929 99 %     Weight 10/30/15 1928 180 lb (81.647 kg)     Height 10/30/15 1928 6\' 2"  (1.88 m)     Head Cir --      Peak Flow --      Pain Score --      Pain Loc --      Pain Edu? --      Excl. in [place]? --      Constitutional:   Alert but drowsy, oriented 3.. Well appearing and in no distress. Eyes:   No scleral icterus. No conjunctival pallor. PERRL. EOMI ENT   Head:   Normocephalic and atraumatic.   Nose:   No congestion/rhinnorhea. No septal hematoma   Mouth/Throat:   MMM, no pharyngeal erythema. No peritonsillar mass. No uvula shift.   Neck:   No stridor. No SubQ emphysema. No meningismus. Hematological/Lymphatic/Immunilogical:   No cervical lymphadenopathy. Cardiovascular:   RRR. Normal and symmetric distal pulses are present in all extremities. No murmurs, rubs, or gallops.  Respiratory:   Normal respiratory effort without tachypnea nor retractions. Breath sounds are clear and equal bilaterally. No wheezes/rales/rhonchi. Gastrointestinal:   Soft and nontender. No distention. There is no CVA tenderness.  No rebound, rigidity, or guarding. Genitourinary:   deferred Musculoskeletal:   Nontender with normal range of motion in all extremities. No joint effusions.  No lower extremity tenderness.  No edema. Neurologic:   Slightly slurred speech.  CN 2-10 normal. Motor grossly intact. No pronator drift.   No gross focal neurologic deficits are appreciated.  Skin:    Skin is warm, dry and intact. No rash noted.  No petechiae, purpura, or bullae. Psychiatric:   Mood and affect  are normal. Speech and behavior are normal. Patient exhibits appropriate insight and judgment.  ____________________________________________    LABS (pertinent positives/negatives) (all labs ordered are listed, but only abnormal results are displayed) Labs Reviewed  BASIC METABOLIC PANEL - Abnormal; Notable for the following:    Potassium 3.3 (*)    Chloride 99 (*)    Glucose, Bld 118 (*)    BUN 58 (*)    Creatinine, Ser 2.48 (*)    Calcium 8.4 (*)    GFR calc non Af Amer 22 (*)    GFR calc Af Amer 26 (*)    All other components within normal limits  ETHANOL - Abnormal; Notable for the following:    Alcohol, Ethyl (B) 207 (*)    All other components within normal limits  CBC WITH DIFFERENTIAL/PLATELET - Abnormal; Notable for the following:    RBC 4.03 (*)    HCT 39.4 (*)    Neutro Abs 6.7 (*)    Lymphs Abs 0.9 (*)    Monocytes Absolute 1.3 (*)    All other components within normal limits  TROPONIN I  URINALYSIS COMPLETEWITH MICROSCOPIC ([facility] ONLY)   ____________________________________________   EKG  Interpreted by me Sinus rhythm rate of 57 with ectopy. Normal axis and intervals, right bundle branch block, normal ST segments and T waves.  Repeat EKG at 2016 shows sinus bradycardia rate of 39, normal axis intervals. Right bundle branch block, normal ST segments and T waves  ____________________________________________    RADIOLOGY    ____________________________________________   PROCEDURES   ____________________________________________   INITIAL IMPRESSION / ASSESSMENT AND PLAN / ED COURSE  Pertinent labs & imaging results that were available during my care of the patient were reviewed by me and considered in my medical decision making (see chart for details).  Patient presents with obvious alcohol intoxication, ethanol on breath. We'll check labs. He also has intermittent bradycardia, but does not appear to be symptomatic from this. He follows up with  Dr. Satira Mccallum and will have him follow-up with them for consideration of Holter monitoring. History is strongly consistent with alcohol intoxication and may be an antihistamine overdose mildly as a cause for today's symptoms. He is improving with monitoring in the emergency department and feels fine. We'll discharge home to follow up with his primary care and cardiology. No suspicion for ACS PE or other cardiopulmonary pathology or sepsis.     ____________________________________________   FINAL CLINICAL IMPRESSION(S) / ED DIAGNOSES  Final diagnoses:  Decreased alertness  Alcohol intoxication, uncomplicated (HCC)      Carrie Mew, MD 10/30/15 2213

## 2015-10-30 NOTE — Discharge Instructions (Signed)
Alcohol Intoxication  Alcohol intoxication occurs when you drink enough alcohol that it affects your ability to function. It can be mild or very severe. Drinking a lot of alcohol in a short time is called binge drinking. This can be very harmful. Drinking alcohol can also be more dangerous if you are taking medicines or other drugs. Some of the effects caused by alcohol may include:  · Loss of coordination.  · Changes in mood and behavior.  · Unclear thinking.  · Trouble talking (slurred speech).  · Throwing up (vomiting).  · Confusion.  · Slowed breathing.  · Twitching and shaking (seizures).  · Loss of consciousness.  HOME CARE  · Do not drive after drinking alcohol.  · Drink enough water and fluids to keep your pee (urine) clear or pale yellow. Avoid caffeine.  · Only take medicine as told by your doctor.  GET HELP IF:  · You throw up (vomit) many times.  · You do not feel better after a few days.  · You frequently have alcohol intoxication. Your doctor can help decide if you should see a substance use treatment counselor.  GET HELP RIGHT AWAY IF:  · You become shaky when you stop drinking.  · You have twitching and shaking.  · You throw up blood. It may look bright red or like coffee grounds.  · You notice blood in your poop (bowel movements).  · You become lightheaded or pass out (faint).  MAKE SURE YOU:   · Understand these instructions.  · Will watch your condition.  · Will get help right away if you are not doing well or get worse.     This information is not intended to replace advice given to you by your health care provider. Make sure you discuss any questions you have with your health care provider.     Document Released: 05/07/2008 Document Revised: 07/22/2013 Document Reviewed: 04/24/2013  Elsevier Interactive Patient Education ©2016 Elsevier Inc.

## 2015-10-30 NOTE — ED Notes (Signed)
Per EMS:  Wife called for decreased LOC after patient had consumed a larger portion than normal of etoh.  Was drinking martinis.  Is alert and oriented but sleepy.

## 2015-12-20 ENCOUNTER — Ambulatory Visit
Admission: RE | Admit: 2015-12-20 | Discharge: 2015-12-20 | Disposition: A | Discharge status: Home / Self Care | Payer: Medicare Other | Source: Ambulatory Visit | Attending: Specialist | Admitting: Specialist

## 2015-12-20 DIAGNOSIS — J90 Pleural effusion, not elsewhere classified: Secondary | ICD-10-CM | POA: Insufficient documentation

## 2015-12-20 DIAGNOSIS — Z8551 Personal history of malignant neoplasm of bladder: Secondary | ICD-10-CM | POA: Insufficient documentation

## 2015-12-20 DIAGNOSIS — K449 Diaphragmatic hernia without obstruction or gangrene: Secondary | ICD-10-CM | POA: Diagnosis not present

## 2015-12-20 DIAGNOSIS — J479 Bronchiectasis, uncomplicated: Secondary | ICD-10-CM | POA: Diagnosis not present

## 2015-12-20 DIAGNOSIS — I2510 Atherosclerotic heart disease of native coronary artery without angina pectoris: Secondary | ICD-10-CM | POA: Diagnosis not present

## 2015-12-20 DIAGNOSIS — I272 Other secondary pulmonary hypertension: Secondary | ICD-10-CM | POA: Insufficient documentation

## 2015-12-20 DIAGNOSIS — Z9049 Acquired absence of other specified parts of digestive tract: Secondary | ICD-10-CM | POA: Diagnosis not present

## 2015-12-20 DIAGNOSIS — I700 Atherosclerosis of aorta: Secondary | ICD-10-CM | POA: Diagnosis not present

## 2015-12-20 DIAGNOSIS — Z8546 Personal history of malignant neoplasm of prostate: Secondary | ICD-10-CM | POA: Insufficient documentation

## 2015-12-20 DIAGNOSIS — R911 Solitary pulmonary nodule: Secondary | ICD-10-CM | POA: Insufficient documentation

## 2015-12-27 DIAGNOSIS — J432 Centrilobular emphysema: Secondary | ICD-10-CM | POA: Insufficient documentation

## 2016-02-12 ENCOUNTER — Emergency Department: Payer: Medicare Other

## 2016-02-12 ENCOUNTER — Observation Stay
Admission: EM | Admit: 2016-02-12 | Discharge: 2016-02-14 | Disposition: A | Discharge status: Skilled Nursing Facility | Payer: Medicare Other | Attending: Internal Medicine | Admitting: Internal Medicine

## 2016-02-12 ENCOUNTER — Encounter: Payer: Self-pay | Admitting: Emergency Medicine

## 2016-02-12 DIAGNOSIS — K529 Noninfective gastroenteritis and colitis, unspecified: Secondary | ICD-10-CM | POA: Insufficient documentation

## 2016-02-12 DIAGNOSIS — Z8673 Personal history of transient ischemic attack (TIA), and cerebral infarction without residual deficits: Secondary | ICD-10-CM | POA: Insufficient documentation

## 2016-02-12 DIAGNOSIS — I4891 Unspecified atrial fibrillation: Principal | ICD-10-CM | POA: Insufficient documentation

## 2016-02-12 DIAGNOSIS — I6529 Occlusion and stenosis of unspecified carotid artery: Secondary | ICD-10-CM | POA: Diagnosis not present

## 2016-02-12 DIAGNOSIS — E1122 Type 2 diabetes mellitus with diabetic chronic kidney disease: Secondary | ICD-10-CM | POA: Diagnosis not present

## 2016-02-12 DIAGNOSIS — Z8551 Personal history of malignant neoplasm of bladder: Secondary | ICD-10-CM | POA: Insufficient documentation

## 2016-02-12 DIAGNOSIS — M109 Gout, unspecified: Secondary | ICD-10-CM | POA: Diagnosis not present

## 2016-02-12 DIAGNOSIS — Z823 Family history of stroke: Secondary | ICD-10-CM | POA: Diagnosis not present

## 2016-02-12 DIAGNOSIS — K567 Ileus, unspecified: Secondary | ICD-10-CM | POA: Diagnosis present

## 2016-02-12 DIAGNOSIS — Z86010 Personal history of colonic polyps: Secondary | ICD-10-CM | POA: Insufficient documentation

## 2016-02-12 DIAGNOSIS — E785 Hyperlipidemia, unspecified: Secondary | ICD-10-CM | POA: Diagnosis not present

## 2016-02-12 DIAGNOSIS — I4510 Unspecified right bundle-branch block: Secondary | ICD-10-CM | POA: Insufficient documentation

## 2016-02-12 DIAGNOSIS — Z8546 Personal history of malignant neoplasm of prostate: Secondary | ICD-10-CM | POA: Diagnosis not present

## 2016-02-12 DIAGNOSIS — J449 Chronic obstructive pulmonary disease, unspecified: Secondary | ICD-10-CM | POA: Diagnosis not present

## 2016-02-12 DIAGNOSIS — I341 Nonrheumatic mitral (valve) prolapse: Secondary | ICD-10-CM | POA: Insufficient documentation

## 2016-02-12 DIAGNOSIS — R1110 Vomiting, unspecified: Secondary | ICD-10-CM

## 2016-02-12 DIAGNOSIS — Z794 Long term (current) use of insulin: Secondary | ICD-10-CM | POA: Insufficient documentation

## 2016-02-12 DIAGNOSIS — E7800 Pure hypercholesterolemia, unspecified: Secondary | ICD-10-CM | POA: Diagnosis not present

## 2016-02-12 DIAGNOSIS — R778 Other specified abnormalities of plasma proteins: Secondary | ICD-10-CM | POA: Diagnosis not present

## 2016-02-12 DIAGNOSIS — Z79899 Other long term (current) drug therapy: Secondary | ICD-10-CM | POA: Insufficient documentation

## 2016-02-12 DIAGNOSIS — I517 Cardiomegaly: Secondary | ICD-10-CM | POA: Diagnosis not present

## 2016-02-12 DIAGNOSIS — Z87891 Personal history of nicotine dependence: Secondary | ICD-10-CM | POA: Diagnosis not present

## 2016-02-12 DIAGNOSIS — R251 Tremor, unspecified: Secondary | ICD-10-CM | POA: Diagnosis not present

## 2016-02-12 DIAGNOSIS — I351 Nonrheumatic aortic (valve) insufficiency: Secondary | ICD-10-CM | POA: Insufficient documentation

## 2016-02-12 DIAGNOSIS — R112 Nausea with vomiting, unspecified: Secondary | ICD-10-CM | POA: Insufficient documentation

## 2016-02-12 DIAGNOSIS — Z833 Family history of diabetes mellitus: Secondary | ICD-10-CM | POA: Insufficient documentation

## 2016-02-12 DIAGNOSIS — N184 Chronic kidney disease, stage 4 (severe): Secondary | ICD-10-CM | POA: Insufficient documentation

## 2016-02-12 DIAGNOSIS — I129 Hypertensive chronic kidney disease with stage 1 through stage 4 chronic kidney disease, or unspecified chronic kidney disease: Secondary | ICD-10-CM | POA: Diagnosis not present

## 2016-02-12 DIAGNOSIS — E860 Dehydration: Secondary | ICD-10-CM | POA: Diagnosis not present

## 2016-02-12 DIAGNOSIS — Z8249 Family history of ischemic heart disease and other diseases of the circulatory system: Secondary | ICD-10-CM | POA: Diagnosis not present

## 2016-02-12 HISTORY — DX: Unspecified right bundle-branch block: I45.10

## 2016-02-12 HISTORY — DX: Malignant neoplasm of prostate: C61

## 2016-02-12 HISTORY — DX: Nonrheumatic mitral (valve) prolapse: I34.1

## 2016-02-12 HISTORY — DX: Malignant neoplasm of bladder, unspecified: C67.9

## 2016-02-12 HISTORY — DX: Cerebral infarction, unspecified: I63.9

## 2016-02-12 HISTORY — DX: Polyp of colon: K63.5

## 2016-02-12 HISTORY — DX: Occlusion and stenosis of unspecified carotid artery: I65.29

## 2016-02-12 LAB — COMPREHENSIVE METABOLIC PANEL
ALT: 19 U/L (ref 17–63)
ANION GAP: 14 (ref 5–15)
AST: 31 U/L (ref 15–41)
Albumin: 3.5 g/dL (ref 3.5–5.0)
Alkaline Phosphatase: 61 U/L (ref 38–126)
BILIRUBIN TOTAL: 0.5 mg/dL (ref 0.3–1.2)
BUN: 72 mg/dL — ABNORMAL HIGH (ref 6–20)
CHLORIDE: 96 mmol/L — AB (ref 101–111)
CO2: 27 mmol/L (ref 22–32)
Calcium: 8.9 mg/dL (ref 8.9–10.3)
Creatinine, Ser: 2.79 mg/dL — ABNORMAL HIGH (ref 0.61–1.24)
GFR, EST AFRICAN AMERICAN: 22 mL/min — AB (ref >60)
GFR, EST NON AFRICAN AMERICAN: 19 mL/min — AB (ref >60)
Glucose, Bld: 218 mg/dL — ABNORMAL HIGH (ref 65–99)
POTASSIUM: 3.1 mmol/L — AB (ref 3.5–5.1)
Sodium: 137 mmol/L (ref 135–145)
TOTAL PROTEIN: 7.2 g/dL (ref 6.5–8.1)

## 2016-02-12 LAB — CBC WITH DIFFERENTIAL/PLATELET
BASOS ABS: 0.1 10*3/uL (ref 0–0.1)
Basophils Relative: 1 %
EOS PCT: 1 %
Eosinophils Absolute: 0.1 10*3/uL (ref 0–0.7)
HEMATOCRIT: 43.7 % (ref 40.0–52.0)
Hemoglobin: 14.6 g/dL (ref 13.0–18.0)
LYMPHS ABS: 0.6 10*3/uL — AB (ref 1.0–3.6)
LYMPHS PCT: 5 %
MCH: 32.1 pg (ref 26.0–34.0)
MCHC: 33.5 g/dL (ref 32.0–36.0)
MCV: 96.1 fL (ref 80.0–100.0)
MONO ABS: 1.2 10*3/uL — AB (ref 0.2–1.0)
MONOS PCT: 9 %
Neutro Abs: 11 10*3/uL — ABNORMAL HIGH (ref 1.4–6.5)
Neutrophils Relative %: 84 %
PLATELETS: 253 10*3/uL (ref 150–440)
RBC: 4.55 MIL/uL (ref 4.40–5.90)
RDW: 14.2 % (ref 11.5–14.5)
WBC: 13 10*3/uL — ABNORMAL HIGH (ref 3.8–10.6)

## 2016-02-12 LAB — APTT: aPTT: 28 seconds (ref 24–36)

## 2016-02-12 LAB — LIPASE, BLOOD: LIPASE: 25 U/L (ref 11–51)

## 2016-02-12 LAB — GLUCOSE, CAPILLARY
GLUCOSE-CAPILLARY: 126 mg/dL — AB (ref 65–99)
GLUCOSE-CAPILLARY: 145 mg/dL — AB (ref 65–99)

## 2016-02-12 LAB — TROPONIN I
TROPONIN I: 0.08 ng/mL — AB (ref <0.031)
Troponin I: 0.1 ng/mL — ABNORMAL HIGH (ref <0.031)

## 2016-02-12 LAB — PROTIME-INR
INR: 1.01
PROTHROMBIN TIME: 13.5 s (ref 11.4–15.0)

## 2016-02-12 MED ORDER — ONDANSETRON HCL 4 MG/2ML IJ SOLN
4.0000 mg | Freq: Once | INTRAMUSCULAR | Status: AC
  Administered 2016-02-12: 4 mg via INTRAVENOUS
  Filled 2016-02-12: qty 2

## 2016-02-12 MED ORDER — CHOLESTYRAMINE 4 G PO PACK
4.0000 g | PACK | Freq: Every day | ORAL | Status: DC
  Administered 2016-02-13: 06:00:00 via ORAL
  Administered 2016-02-14: 4 g via ORAL
  Filled 2016-02-12 (×3): qty 1

## 2016-02-12 MED ORDER — TORSEMIDE 20 MG PO TABS
40.0000 mg | ORAL_TABLET | Freq: Every day | ORAL | Status: DC
  Administered 2016-02-13 – 2016-02-14 (×2): 40 mg via ORAL
  Filled 2016-02-12 (×2): qty 2

## 2016-02-12 MED ORDER — SODIUM CHLORIDE 0.9 % IV BOLUS (SEPSIS)
1000.0000 mL | Freq: Once | INTRAVENOUS | Status: AC
  Administered 2016-02-12: 1000 mL via INTRAVENOUS

## 2016-02-12 MED ORDER — DILTIAZEM HCL 60 MG PO TABS
60.0000 mg | ORAL_TABLET | Freq: Three times a day (TID) | ORAL | Status: DC
  Administered 2016-02-12 – 2016-02-13 (×2): 60 mg via ORAL
  Filled 2016-02-12: qty 2
  Filled 2016-02-12: qty 1

## 2016-02-12 MED ORDER — FAMOTIDINE 20 MG PO TABS
20.0000 mg | ORAL_TABLET | Freq: Two times a day (BID) | ORAL | Status: DC
  Administered 2016-02-12 – 2016-02-13 (×2): 20 mg via ORAL
  Filled 2016-02-12 (×2): qty 1

## 2016-02-12 MED ORDER — SODIUM CHLORIDE 0.9% FLUSH
3.0000 mL | Freq: Two times a day (BID) | INTRAVENOUS | Status: DC
  Administered 2016-02-12 – 2016-02-13 (×2): 3 mL via INTRAVENOUS

## 2016-02-12 MED ORDER — FLEET ENEMA 7-19 GM/118ML RE ENEM: 1.0000 | ENEMA | Freq: Once | RECTAL | Status: DC | PRN

## 2016-02-12 MED ORDER — POLYETHYLENE GLYCOL 3350 17 G PO PACK: 17.0000 g | PACK | Freq: Every day | ORAL | Status: DC | PRN

## 2016-02-12 MED ORDER — INSULIN ASPART 100 UNIT/ML ~~LOC~~ SOLN: 0.0000 [IU] | Freq: Every day | SUBCUTANEOUS | Status: DC

## 2016-02-12 MED ORDER — POTASSIUM CHLORIDE CRYS ER 20 MEQ PO TBCR
40.0000 meq | EXTENDED_RELEASE_TABLET | ORAL | Status: AC
  Administered 2016-02-12 – 2016-02-13 (×2): 40 meq via ORAL
  Filled 2016-02-12 (×2): qty 2

## 2016-02-12 MED ORDER — INSULIN ASPART 100 UNIT/ML ~~LOC~~ SOLN
0.0000 [IU] | Freq: Three times a day (TID) | SUBCUTANEOUS | Status: DC
  Administered 2016-02-13: 3 [IU] via SUBCUTANEOUS
  Administered 2016-02-13 – 2016-02-14 (×2): 5 [IU] via SUBCUTANEOUS
  Administered 2016-02-14: 3 [IU] via SUBCUTANEOUS
  Filled 2016-02-12 (×2): qty 5
  Filled 2016-02-12 (×2): qty 3

## 2016-02-12 MED ORDER — ONDANSETRON HCL 4 MG PO TABS: 4.0000 mg | ORAL_TABLET | Freq: Four times a day (QID) | ORAL | Status: DC | PRN

## 2016-02-12 MED ORDER — BISACODYL 10 MG RE SUPP
10.0000 mg | Freq: Once | RECTAL | Status: DC
  Filled 2016-02-12: qty 1

## 2016-02-12 MED ORDER — ACETAMINOPHEN 325 MG PO TABS: 650.0000 mg | ORAL_TABLET | Freq: Four times a day (QID) | ORAL | Status: DC | PRN

## 2016-02-12 MED ORDER — HEPARIN (PORCINE) IN NACL 100-0.45 UNIT/ML-% IJ SOLN
1150.0000 [IU]/h | INTRAMUSCULAR | Status: DC
  Administered 2016-02-12: 1150 [IU]/h via INTRAVENOUS
  Filled 2016-02-12: qty 250

## 2016-02-12 MED ORDER — DILTIAZEM HCL 25 MG/5ML IV SOLN
10.0000 mg | Freq: Once | INTRAVENOUS | Status: DC
  Filled 2016-02-12: qty 5

## 2016-02-12 MED ORDER — LINAGLIPTIN 5 MG PO TABS
5.0000 mg | ORAL_TABLET | Freq: Every day | ORAL | Status: DC
  Administered 2016-02-13 – 2016-02-14 (×2): 5 mg via ORAL
  Filled 2016-02-12 (×2): qty 1

## 2016-02-12 MED ORDER — ALLOPURINOL 100 MG PO TABS
100.0000 mg | ORAL_TABLET | Freq: Every day | ORAL | Status: DC
  Administered 2016-02-12 – 2016-02-14 (×3): 100 mg via ORAL
  Filled 2016-02-12 (×3): qty 1

## 2016-02-12 MED ORDER — DOCUSATE SODIUM 100 MG PO CAPS
100.0000 mg | ORAL_CAPSULE | Freq: Two times a day (BID) | ORAL | Status: DC
  Administered 2016-02-12 – 2016-02-14 (×4): 100 mg via ORAL
  Filled 2016-02-12 (×4): qty 1

## 2016-02-12 MED ORDER — ATORVASTATIN CALCIUM 20 MG PO TABS
40.0000 mg | ORAL_TABLET | Freq: Every day | ORAL | Status: DC
  Administered 2016-02-12 – 2016-02-13 (×2): 40 mg via ORAL
  Filled 2016-02-12 (×2): qty 2

## 2016-02-12 MED ORDER — POTASSIUM CHLORIDE IN NACL 20-0.9 MEQ/L-% IV SOLN
INTRAVENOUS | Status: DC
  Administered 2016-02-12 – 2016-02-13 (×2): via INTRAVENOUS
  Filled 2016-02-12 (×4): qty 1000

## 2016-02-12 MED ORDER — ONDANSETRON HCL 4 MG/2ML IJ SOLN: 4.0000 mg | Freq: Four times a day (QID) | INTRAMUSCULAR | Status: DC | PRN

## 2016-02-12 MED ORDER — PROPRANOLOL HCL 20 MG PO TABS
20.0000 mg | ORAL_TABLET | Freq: Two times a day (BID) | ORAL | Status: DC
  Administered 2016-02-12 – 2016-02-14 (×4): 20 mg via ORAL
  Filled 2016-02-12 (×4): qty 1

## 2016-02-12 MED ORDER — BISACODYL 5 MG PO TBEC
5.0000 mg | DELAYED_RELEASE_TABLET | Freq: Every day | ORAL | Status: DC | PRN
  Filled 2016-02-12: qty 1

## 2016-02-12 MED ORDER — LOSARTAN POTASSIUM 50 MG PO TABS
50.0000 mg | ORAL_TABLET | Freq: Every day | ORAL | Status: DC
  Administered 2016-02-13 – 2016-02-14 (×2): 50 mg via ORAL
  Filled 2016-02-12 (×2): qty 1

## 2016-02-12 MED ORDER — ACETAMINOPHEN 650 MG RE SUPP: 650.0000 mg | Freq: Four times a day (QID) | RECTAL | Status: DC | PRN

## 2016-02-12 MED ORDER — HEPARIN BOLUS VIA INFUSION
4000.0000 [IU] | Freq: Once | INTRAVENOUS | Status: AC
  Administered 2016-02-12: 4000 [IU] via INTRAVENOUS
  Filled 2016-02-12: qty 4000

## 2016-02-12 MED ORDER — POTASSIUM CHLORIDE IN NACL 20-0.9 MEQ/L-% IV SOLN
INTRAVENOUS | Status: DC
  Filled 2016-02-12: qty 1000

## 2016-02-12 MED ORDER — DIPHENHYDRAMINE HCL 25 MG PO CAPS
25.0000 mg | ORAL_CAPSULE | Freq: Every evening | ORAL | Status: DC | PRN
  Administered 2016-02-12: 25 mg via ORAL
  Filled 2016-02-12: qty 1

## 2016-02-12 MED ORDER — PANTOPRAZOLE SODIUM 40 MG IV SOLR
40.0000 mg | Freq: Once | INTRAVENOUS | Status: AC
  Administered 2016-02-12: 40 mg via INTRAVENOUS
  Filled 2016-02-12: qty 40

## 2016-02-12 MED ORDER — PRIMIDONE 50 MG PO TABS
50.0000 mg | ORAL_TABLET | Freq: Two times a day (BID) | ORAL | Status: DC
  Administered 2016-02-12 – 2016-02-14 (×4): 50 mg via ORAL
  Filled 2016-02-12 (×5): qty 1

## 2016-02-12 NOTE — Progress Notes (Signed)
Patient is admitted to room 242 with the diagnosis of Afib. Alert and oriented x 4.  Fall Neurosurgeon. Tele box verified by the RN and and Estill Bamberg NT. Patient oriented to the unit, staff, ascom/call bell. Moderate plus scored. Bed alarm activated and bed is lowered to the floor. Will continue to monitor.

## 2016-02-12 NOTE — ED Provider Notes (Addendum)
[facility] Emergency Department Provider Note  ____________________________________________  Time seen:  seen upon arrival to the emergency department   I have reviewed the triage vital signs and the nursing notes.   HISTORY  Chief Complaint Emesis    HPI Dean Garcia is a 82 y.o. male  with a history of diabetes and hypertension who is presenting to the emergency department today with nausea and vomiting over the past day. He denies any chest pain, abdominal pain. He denies any shortness of breath. He denies any burning with urination. Denies any diarrhea. No known sick contacts. Says that his vomitus started out green and then changed to yellow and is now back to drinking again. He is brought in by EMS who found him to be in atrial fibrillation with hypertension. Patient says he is passing gas.  denies any abdominal distention. No known history of atrial fibrillation.   Past Medical History  Diagnosis Date  . High cholesterol   . Diabetes ([place])   . Hypertension   . Gout   . Tremor   . Bundle branch block, right   . Mitral valve prolapse     There are no active problems to display for this patient.   Past Surgical History  Procedure Laterality Date  . Appendectomy      Current Outpatient Rx  Name  Route  Sig  Dispense  Refill  . allopurinol (ZYLOPRIM) 300 MG tablet   Oral   Take 100 mg by mouth daily.         Marland Kitchen amLODipine (NORVASC) 5 MG tablet   Oral   Take 5 mg by mouth daily.         Marland Kitchen atorvastatin (LIPITOR) 40 MG tablet   Oral   Take 40 mg by mouth at bedtime.         . fexofenadine (ALLEGRA) 180 MG tablet   Oral   Take 180 mg by mouth as needed for allergies or rhinitis.         Marland Kitchen LOSARTAN POTASSIUM PO   Oral   Take by mouth.         . metolazone (ZAROXOLYN) 2.5 MG tablet   Oral   Take 2.5 mg by mouth daily.         . primidone (MYSOLINE) 50 MG tablet   Oral   Take by mouth 2 (two) times daily.         .  ranitidine (ZANTAC) 150 MG tablet   Oral   Take 150 mg by mouth 2 (two) times daily.         . sitaGLIPtin (JANUVIA) 100 MG tablet   Oral   Take 100 mg by mouth daily.         . TORSEMIDE PO   Oral   Take by mouth.           Allergies Demerol  History reviewed. No pertinent family history.  Social History Social History  Substance Use Topics  . Smoking status: Former Smoker    Types: Pipe    Quit date: 10/29/2010  . Smokeless tobacco: None  . Alcohol Use: Yes    Review of Systems Constitutional: No fever/chills Eyes: No visual changes. ENT: No sore throat. Cardiovascular: Denies chest pain. Respiratory: Denies shortness of breath. Gastrointestinal: No abdominal pain.  No diarrhea.  No constipation. Genitourinary: Negative for dysuria. Musculoskeletal: Negative for back pain. Skin: Negative for rash. Neurological: Negative for headaches, focal weakness or numbness.  10-point ROS otherwise negative.  ____________________________________________   PHYSICAL EXAM:  VITAL SIGNS: ED Triage Vitals  Enc Vitals Group     BP 02/12/16 1611 156/101 mmHg     Pulse Rate 02/12/16 1611 69     Resp --      Temp 02/12/16 1611 97.7 F (36.5 C)     Temp Source 02/12/16 1611 Oral     SpO2 02/12/16 1611 95 %     Weight 02/12/16 1611 180 lb (81.647 kg)     Height 02/12/16 1611 6\' 1"  (1.854 m)     Head Cir --      Peak Flow --      Pain Score 02/12/16 1613 0     Pain Loc --      Pain Edu? --      Excl. in [place]? --     Constitutional: Alert and oriented. green vomitus and emesis bag. Eyes: Conjunctivae are normal. PERRL. EOMI. Head: Atraumatic. Nose: No congestion/rhinnorhea. Mouth/Throat: Mucous membranes are moist.   Neck: No stridor.   Cardiovascular:  irregularly irregular and tachycardic . Grossly normal heart sounds.   Respiratory: Normal respiratory effort.  No retractions. Lungs CTAB. Gastrointestinal: Soft and nontender. No distention.  No CVA  tenderness. Musculoskeletal: No lower extremity tenderness nor edema.  No joint effusions. Neurologic:  Normal speech and language. No gross focal neurologic deficits are appreciated.  Skin:  Skin is warm, dry and intact. No rash noted. Psychiatric: Mood and affect are normal. Speech and behavior are normal.  ____________________________________________   LABS (all labs ordered are listed, but only abnormal results are displayed)  Labs Reviewed  CBC WITH DIFFERENTIAL/PLATELET - Abnormal; Notable for the following:    WBC 13.0 (*)    Neutro Abs 11.0 (*)    Lymphs Abs 0.6 (*)    Monocytes Absolute 1.2 (*)    All other components within normal limits  COMPREHENSIVE METABOLIC PANEL - Abnormal; Notable for the following:    Potassium 3.1 (*)    Chloride 96 (*)    Glucose, Bld 218 (*)    BUN 72 (*)    Creatinine, Ser 2.79 (*)    GFR calc non Af Amer 19 (*)    GFR calc Af Amer 22 (*)    All other components within normal limits  TROPONIN I - Abnormal; Notable for the following:    Troponin I 0.08 (*)    All other components within normal limits  TROPONIN I - Abnormal; Notable for the following:    Troponin I 0.10 (*)    All other components within normal limits  LIPASE, BLOOD  URINALYSIS COMPLETEWITH MICROSCOPIC ([facility] ONLY)  APTT  PROTIME-INR   ____________________________________________  EKG  ED ECG REPORT I, Doran Stabler, the attending physician, personally viewed and interpreted this ECG.   Date: 02/12/2016  EKG Time: 1603  Rate: 99  Rhythm: atrial fibrillation, rate 99  Axis: Normal  Intervals:right bundle branch block and left posterior fascicular block  ST&T Change: T wave inversions in 2, 3 and aVF. Also T wave inversion in lead V2 and V3. 1 mm depression in lead 2, 3 and aVF. Also with depressions in V3 and V4. Significant change in morphology from EKG done on 10/31/2015.  ____________________________________________  RADIOLOGY  IMPRESSION: No  evidence for intraperitoneal free air or bowel obstruction. Small bowel appearance raises the question of an underlying component of ileus.   Electronically Signed By: Misty Stanley M.D. On: 02/12/2016 16:43 ____________________________________________   PROCEDURES   ____________________________________________  INITIAL IMPRESSION / ASSESSMENT AND PLAN / ED COURSE  Pertinent labs & imaging results that were available during my care of the patient were reviewed by me and considered in my medical decision making (see chart for details).  ----------------------------------------- 6:34 PM on 02/12/2016 -----------------------------------------  Patient is no longer vomiting and is now requesting water. His wife is now the bedside. He says that he used to be on blood thinner but does not know if he has a history of atrial fibrillation. Says he was taken off the blood thinner because of bleeding after he had a cut. Denies any history of GI bleeding.  Discussed case with Dr. Nehemiah Massed who says that the patient may be discharged on 4 mg of by mouth Coumadin every evening. He says that the patient may follow-up in the office tomorrow. He took the patient's name for follow-up.  Patient's abdomen reexamined at this time and is soft and nontender.  Patient has a history of renal failure and appears to be close to his baseline at this time. Does have an elevated troponin but this may be more related to the atrial fibrillation vomiting. Will recheck.  ----------------------------------------- 7:56 PM on 02/12/2016 -----------------------------------------  Patient still feeling very weak. Mild nausea but no further episodes of vomiting. He is now tolerating by mouth fluids. His troponin trended slightly up from 0.0 8 to .10. Because he is not feeling 100% and his troponin is trending up with fluids I will admit him to the hospital. I will start him on heparin because of his new onset  atrial fibrillation. ____________________________________________   FINAL CLINICAL IMPRESSION(S) / ED DIAGNOSES  Final diagnoses:  Vomiting   atrial fibrillation.  Nausea and vomiting.    Orbie Pyo, MD 02/12/16 [phone]  Signed out to Dr. Darvin Neighbours.  Orbie Pyo, MD 02/12/16 [phone]

## 2016-02-12 NOTE — ED Notes (Signed)
Assisted patient with urinal. Patient unable to urinate. AS

## 2016-02-12 NOTE — Progress Notes (Signed)
ANTICOAGULATION CONSULT NOTE - Initial Consult  Pharmacy Consult for heparin drip dosing and monitoring Indication: atrial fibrillation  Allergies  Allergen Reactions  . Demerol [Meperidine] Nausea And Vomiting    Patient Measurements: Height: 6\' 1"  (185.4 cm) Weight: 180 lb (81.647 kg) IBW/kg (Calculated) : 79.9 Heparin Dosing Weight: 81.6 kg  Vital Signs: Temp: 97.7 F (36.5 C) (03/12 1611) Temp Source: Oral (03/12 1611) BP: 150/66 mmHg (03/12 1830) Pulse Rate: 77 (03/12 1830)  Labs:  Recent Labs  02/12/16 1618 02/12/16 1845  HGB 14.6  --   HCT 43.7  --   PLT 253  --   CREATININE 2.79*  --   TROPONINI 0.08* 0.10*    Estimated Creatinine Clearance: 21.9 mL/min (by C-G formula based on Cr of 2.79).  Assessment: Pharmacy consulted to dose and monitor heparin drip in this 82 year old male being admitted with new onset atrial fibrillation. Patient was not taking anticoagulants prior to admission.  Baseline labs: Hgb 14.6 Plt 253  APTT and protime/INR ordered STAT  Goal of Therapy:  Heparin level 0.3-0.7 units/ml Monitor platelets by anticoagulation protocol: Yes   Plan:  Give 4000 units bolus x 1 (~50 units/kg) Start heparin infusion at 1150 units/hr (~14 units/kg/hr) Check anti-Xa level in 8 hours and daily while on heparin Continue to monitor H&H and platelets  Thank you for the consult.  Lenis Noon, PharmD Clinical Pharmacist 02/12/2016,8:01 PM

## 2016-02-12 NOTE — Care Management Obs Status (Signed)
[facility] NOTIFICATION   Patient Details  Name: Dean Garcia MRN: [id number] Date of Birth: 30-Apr-1928   Medicare Observation Status Notification Given:  Yes    CrutchfieldAntony Haste, RN 02/12/2016, 8:45 PM

## 2016-02-12 NOTE — ED Notes (Signed)
Pt arrived from home by EMS with c/o of vomiting since yesterday. Upon arrival EMS found Pt in Afib and vomiting. Pt continues to be in Afib and has not been dx.HR is up and down ranging from 90-140 and highest reading was brief.

## 2016-02-12 NOTE — Progress Notes (Signed)
Patient is requesting for a sleeping medication but he does not take any sleeping pill at home. Dr. Jannifer Franklin notified with a new order for Benadryl 25 mg oral PRN at bedtime.

## 2016-02-12 NOTE — H&P (Signed)
[facility] NAME: Dean Garcia    MR#:  [id number]  DATE OF BIRTH:  1928/04/02  DATE OF ADMISSION:  02/12/2016  PRIMARY CARE PHYSICIAN: Kirk Ruths., MD   REQUESTING/REFERRING PHYSICIAN: Dr. Dineen Kid  CHIEF COMPLAINT:   Chief Complaint  Patient presents with  . Emesis    HISTORY OF PRESENT ILLNESS:  Dean Garcia  is a 82 y.o. male with a known history of diabetes, hypertension, prostate cancer, CKD stage IV persons to the emergency room with 1 day of nausea and vomiting. He has complained of a bilious vomiting. Which was noticed in the emergency room. He did have a normal bowel movement yesterday morning. Has been passing gas. No abdominal pain. In the emergency room he has been noticed to have atrial fibrillation with rapid ventricular rate with improved heartrate with fluid hydration. She'll continues to be tachycardic into the 120s. Troponin was found to be 0.08 and repeat troponin 0.10. No chest pain. He does have shortness of breath due to COPD which is unchanged. No history of atrial fibrillation. He was on a blood thinner in the past which he does not remember the name off. This was stopped by Dr. Ubaldo Glassing many years back due to bleeding from the wound. He does not have any blood in his stool or melena. He did have a fall due to balance problems earlier this year. Did not hit his head or lose consciousness.  PAST MEDICAL HISTORY:   Past Medical History  Diagnosis Date  . High cholesterol   . Diabetes ([place])   . Hypertension   . Gout   . Tremor   . Bundle branch block, right   . Mitral valve prolapse   . CVA (cerebral infarction)   . Carotid stenosis   . Bladder cancer ([place])   . Prostate cancer ([place])   . Colon polyp     PAST SURGICAL HISTORY:   Past Surgical History  Procedure Laterality Date  . Appendectomy      SOCIAL HISTORY:   Social History  Substance Use Topics  . Smoking status: Former Smoker     Types: Pipe    Quit date: 10/29/2010  . Smokeless tobacco: Not on file  . Alcohol Use: Yes    FAMILY HISTORY:   Family History  Problem Relation Age of Onset  . Hypertension Father   . Stroke Father   . Diabetes Mother     DRUG ALLERGIES:   Allergies  Allergen Reactions  . Demerol [Meperidine] Nausea And Vomiting    REVIEW OF SYSTEMS:   Review of Systems  Constitutional: Positive for malaise/fatigue. Negative for fever, chills and weight loss.  HENT: Negative for hearing loss and nosebleeds.   Eyes: Negative for blurred vision, double vision and pain.  Respiratory: Positive for shortness of breath (chronic). Negative for cough, hemoptysis, sputum production and wheezing.   Cardiovascular: Negative for chest pain, palpitations, orthopnea and leg swelling.  Gastrointestinal: Positive for nausea and vomiting. Negative for abdominal pain, diarrhea and constipation.  Genitourinary: Negative for dysuria and hematuria.  Musculoskeletal: Negative for myalgias, back pain and falls.  Skin: Negative for rash.  Neurological: Positive for weakness. Negative for dizziness, tremors, sensory change, speech change, focal weakness, seizures and headaches.  Endo/Heme/Allergies: Does not bruise/bleed easily.  Psychiatric/Behavioral: Negative for depression and memory loss. The patient is not nervous/anxious.     MEDICATIONS AT HOME:   Prior to Admission medications   Medication Sig Start Date  End Date Taking? Authorizing Provider  allopurinol (ZYLOPRIM) 300 MG tablet Take 100 mg by mouth daily.   Yes Historical Provider, MD  amLODipine (NORVASC) 5 MG tablet Take 5 mg by mouth daily.   Yes Historical Provider, MD  atorvastatin (LIPITOR) 40 MG tablet Take 40 mg by mouth at bedtime.   Yes Historical Provider, MD  cholestyramine (QUESTRAN) 4 g packet Take 4 g by mouth daily at 6 (six) AM.   Yes Historical Provider, MD  fexofenadine (ALLEGRA) 180 MG tablet Take 180 mg by mouth as needed for  allergies or rhinitis.   Yes Historical Provider, MD  insulin lispro protamine-lispro (HUMALOG 75/25 MIX) (75-25) 100 UNIT/ML SUSP injection Inject 10 Units into the skin daily before supper.   Yes Historical Provider, MD  losartan (COZAAR) 50 MG tablet Take 50 mg by mouth daily.   Yes Historical Provider, MD  metolazone (ZAROXOLYN) 2.5 MG tablet Take 2.5 mg by mouth every other day.    Yes Historical Provider, MD  primidone (MYSOLINE) 50 MG tablet Take 50 mg by mouth 2 (two) times daily.    Yes Historical Provider, MD  propranolol (INDERAL) 20 MG tablet Take 20 mg by mouth 2 (two) times daily.   Yes Historical Provider, MD  ranitidine (ZANTAC) 150 MG tablet Take 300 mg by mouth every evening.    Yes Historical Provider, MD  sitaGLIPtin (JANUVIA) 100 MG tablet Take 100 mg by mouth daily.   Yes Historical Provider, MD  torsemide (DEMADEX) 20 MG tablet Take 40 mg by mouth daily.   Yes Historical Provider, MD     VITAL SIGNS:  Blood pressure 150/66, pulse 77, temperature 97.7 F (36.5 C), temperature source Oral, resp. rate 25, height 6\' 1"  (1.854 m), weight 81.647 kg (180 lb), SpO2 98 %.  PHYSICAL EXAMINATION:  Physical Exam  GENERAL:  82 y.o.-year-old patient lying in the bed with no acute distress.  EYES: Pupils equal, round, reactive to light and accommodation. No scleral icterus. Extraocular muscles intact.  HEENT: Head atraumatic, normocephalic. Oropharynx and nasopharynx clear. No oropharyngeal erythema, moist oral mucosa  NECK:  Supple, no jugular venous distention. No thyroid enlargement, no tenderness.  LUNGS: Normal breath sounds bilaterally, no wheezing, rales, rhonchi. No use of accessory muscles of respiration.  CARDIOVASCULAR: S1, S2 normal. No murmurs, rubs, or gallops. Irregular and tachycardic ABDOMEN: Soft, nontender, nondistended. Bowel sounds present. No organomegaly or mass. Umbilical hernia. EXTREMITIES: No pedal edema, cyanosis, or clubbing. + 2 pedal & radial pulses  b/l.   NEUROLOGIC: Cranial nerves II through XII are intact. No focal Motor or sensory deficits appreciated b/l PSYCHIATRIC: The patient is alert and oriented x 3. Good affect.  SKIN: No obvious rash, lesion, or ulcer.   LABORATORY PANEL:   CBC  Recent Labs Lab 02/12/16 1618  WBC 13.0*  HGB 14.6  HCT 43.7  PLT 253   ------------------------------------------------------------------------------------------------------------------  Chemistries   Recent Labs Lab 02/12/16 1618  NA 137  K 3.1*  CL 96*  CO2 27  GLUCOSE 218*  BUN 72*  CREATININE 2.79*  CALCIUM 8.9  AST 31  ALT 19  ALKPHOS 61  BILITOT 0.5   ------------------------------------------------------------------------------------------------------------------  Cardiac Enzymes  Recent Labs Lab 02/12/16 1845  TROPONINI 0.10*   ------------------------------------------------------------------------------------------------------------------  RADIOLOGY:  Dg Abd 2 Views  02/12/2016  CLINICAL DATA:  Nausea and vomiting EXAM: ABDOMEN - 2 VIEW COMPARISON:  CT scan from 03/02/2015. FINDINGS: Upright film shows no evidence for intraperitoneal free air. Supine film shows no overt gaseous  small bowel dilatation. There is distention of central small bowel which appears air-filled. Surgical clips in the right upper quadrant suggest prior cholecystectomy. Visualized bony anatomy is unremarkable. 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 IMPRESSION: No evidence for intraperitoneal free air or bowel obstruction. Small bowel appearance raises the question of an underlying component of ileus. Electronically Signed   By: Misty Stanley M.D.   On: 02/12/2016 16:43     IMPRESSION AND PLAN:   * New onset atrial fibrillation with rapid ventricular rate Patient is on Inderal at home which will be continued. We will add Cardizem 60 mg by mouth 3 times a day. Heparin  drip. Consult cardiology. Order echocardiogram. Troponin trend is stable. We will get one more troponin in the morning. No chest pain.  * Ileus Abdominal x-ray raise concern for ileus. Presently patient is able to drink water. Passing gas. We will order Dulcolax suppository. Repeat x-ray in the morning. Benign abdomen on exam.  * Hypertension We'll continue home medications. Hold Norvasc as patient is being started on Cardizem.  * CKD stage IV Mild worsening of BUN likely from dehydration due to vomiting. Start IV fluids. Hold patient's diuretic.  * Diabetes mellitus We'll start sliding scale insulin. Held nighttime insulin dose due to poor by mouth intake.  * DVT prophylaxis Patient is on a heparin drip  All the records are reviewed and case discussed with ED provider. Management plans discussed with the patient, family and they are in agreement.  CODE STATUS: DNR  TOTAL TIME TAKING CARE OF THIS PATIENT: 40 minutes.   Hillary Bow R M.D on 02/12/2016 at 8:34 PM  Between 7am to 6pm - Pager - [phone]  After 6pm go to [email or web address] - password [email or web address] [facility]  Office  [phone]  CC: Primary care physician; Kirk Ruths., MD  Note: This dictation was prepared with Dragon dictation along with smaller phrase technology. Any transcriptional errors that result from this process are unintentional.

## 2016-02-13 ENCOUNTER — Observation Stay: Payer: Medicare Other

## 2016-02-13 ENCOUNTER — Observation Stay
Admit: 2016-02-13 | Discharge: 2016-02-13 | Disposition: A | Discharge status: Home / Self Care | Payer: Medicare Other | Attending: Internal Medicine | Admitting: Internal Medicine

## 2016-02-13 DIAGNOSIS — I4891 Unspecified atrial fibrillation: Secondary | ICD-10-CM | POA: Diagnosis not present

## 2016-02-13 LAB — CBC WITH DIFFERENTIAL/PLATELET
Basophils Absolute: 0.1 10*3/uL (ref 0–0.1)
Basophils Relative: 1 %
EOS ABS: 0.1 10*3/uL (ref 0–0.7)
EOS PCT: 1 %
HCT: 38.5 % — ABNORMAL LOW (ref 40.0–52.0)
Hemoglobin: 13.2 g/dL (ref 13.0–18.0)
LYMPHS ABS: 0.7 10*3/uL — AB (ref 1.0–3.6)
Lymphocytes Relative: 7 %
MCH: 32.4 pg (ref 26.0–34.0)
MCHC: 34.2 g/dL (ref 32.0–36.0)
MCV: 94.6 fL (ref 80.0–100.0)
MONOS PCT: 11 %
Monocytes Absolute: 1.2 10*3/uL — ABNORMAL HIGH (ref 0.2–1.0)
Neutro Abs: 8.2 10*3/uL — ABNORMAL HIGH (ref 1.4–6.5)
Neutrophils Relative %: 80 %
PLATELETS: 217 10*3/uL (ref 150–440)
RBC: 4.08 MIL/uL — ABNORMAL LOW (ref 4.40–5.90)
RDW: 14.3 % (ref 11.5–14.5)
WBC: 10.2 10*3/uL (ref 3.8–10.6)

## 2016-02-13 LAB — URINALYSIS COMPLETE WITH MICROSCOPIC (ARMC ONLY)
BILIRUBIN URINE: NEGATIVE
Bacteria, UA: NONE SEEN
Glucose, UA: NEGATIVE mg/dL
HGB URINE DIPSTICK: NEGATIVE
KETONES UR: NEGATIVE mg/dL
LEUKOCYTES UA: NEGATIVE
NITRITE: NEGATIVE
PH: 6 (ref 5.0–8.0)
Protein, ur: NEGATIVE mg/dL
SPECIFIC GRAVITY, URINE: 1.009 (ref 1.005–1.030)
Squamous Epithelial / LPF: NONE SEEN

## 2016-02-13 LAB — HEPARIN LEVEL (UNFRACTIONATED)
HEPARIN UNFRACTIONATED: 0.44 [IU]/mL (ref 0.30–0.70)
Heparin Unfractionated: 1.2 IU/mL — ABNORMAL HIGH (ref 0.30–0.70)
Heparin Unfractionated: 0.73 IU/mL — ABNORMAL HIGH (ref 0.30–0.70)

## 2016-02-13 LAB — BASIC METABOLIC PANEL
ANION GAP: 8 (ref 5–15)
BUN: 65 mg/dL — AB (ref 6–20)
CHLORIDE: 98 mmol/L — AB (ref 101–111)
CO2: 32 mmol/L (ref 22–32)
Calcium: 8.5 mg/dL — ABNORMAL LOW (ref 8.9–10.3)
Creatinine, Ser: 2.58 mg/dL — ABNORMAL HIGH (ref 0.61–1.24)
GFR calc Af Amer: 24 mL/min — ABNORMAL LOW (ref >60)
GFR calc non Af Amer: 21 mL/min — ABNORMAL LOW (ref >60)
GLUCOSE: 127 mg/dL — AB (ref 65–99)
POTASSIUM: 3.9 mmol/L (ref 3.5–5.1)
Sodium: 138 mmol/L (ref 135–145)

## 2016-02-13 LAB — GLUCOSE, CAPILLARY
GLUCOSE-CAPILLARY: 173 mg/dL — AB (ref 65–99)
Glucose-Capillary: 145 mg/dL — ABNORMAL HIGH (ref 65–99)
Glucose-Capillary: 243 mg/dL — ABNORMAL HIGH (ref 65–99)
Glucose-Capillary: 134 mg/dL — ABNORMAL HIGH (ref 65–99)

## 2016-02-13 LAB — TROPONIN I: Troponin I: 0.08 ng/mL — ABNORMAL HIGH (ref <0.031)

## 2016-02-13 LAB — TSH: TSH: 2.708 u[IU]/mL (ref 0.350–4.500)

## 2016-02-13 MED ORDER — WARFARIN SODIUM 5 MG PO TABS
5.0000 mg | ORAL_TABLET | Freq: Every day | ORAL | Status: DC
  Administered 2016-02-13: 5 mg via ORAL
  Filled 2016-02-13: qty 1

## 2016-02-13 MED ORDER — WARFARIN SODIUM 2.5 MG PO TABS
2.5000 mg | ORAL_TABLET | Freq: Every day | ORAL | Status: DC
  Administered 2016-02-14: 2.5 mg via ORAL
  Filled 2016-02-13: qty 1

## 2016-02-13 MED ORDER — FAMOTIDINE 20 MG PO TABS: 20.0000 mg | ORAL_TABLET | Freq: Every day | ORAL | Status: DC

## 2016-02-13 MED ORDER — WARFARIN - PHARMACIST DOSING INPATIENT
Freq: Every day | Status: DC
  Administered 2016-02-13: 18:00:00

## 2016-02-13 MED ORDER — DILTIAZEM HCL ER COATED BEADS 240 MG PO CP24
240.0000 mg | ORAL_CAPSULE | Freq: Every day | ORAL | Status: DC
  Administered 2016-02-14: 240 mg via ORAL
  Filled 2016-02-13: qty 1

## 2016-02-13 MED ORDER — WARFARIN - PHYSICIAN DOSING INPATIENT: Freq: Every day | Status: DC

## 2016-02-13 MED ORDER — HEPARIN (PORCINE) IN NACL 100-0.45 UNIT/ML-% IJ SOLN
800.0000 [IU]/h | INTRAMUSCULAR | Status: DC
  Administered 2016-02-13: 800 [IU]/h via INTRAVENOUS
  Administered 2016-02-13: 900 [IU]/h via INTRAVENOUS
  Filled 2016-02-13: qty 250

## 2016-02-13 MED ORDER — WARFARIN SODIUM 5 MG PO TABS: 5.0000 mg | ORAL_TABLET | Freq: Every day | ORAL | Status: DC

## 2016-02-13 MED ORDER — TRAZODONE HCL 50 MG PO TABS
50.0000 mg | ORAL_TABLET | Freq: Every evening | ORAL | Status: DC | PRN
  Administered 2016-02-13: 50 mg via ORAL
  Filled 2016-02-13: qty 1

## 2016-02-13 NOTE — Progress Notes (Signed)
CSW was informed by RN during progression that patient is from [facility]. CSW contacted Seth Bake- Development worker, international aid at [facility]. 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 There are no current CSW needs at this time. CSW is available if a need were to arise. CSW is signing off.   Ernest Pine, MSW, [place] Social Work Department [phone]

## 2016-02-13 NOTE — H&P (Signed)
[facility]  CARDIOLOGY CONSULT NOTE  Patient ID: Dean Garcia MRN: [id number] DOB/AGE: 12-09-1927 82 y.o.  Admit date: 02/12/2016 Referring Physician Hower Primary Physician [facility] Primary Cardiologist Fath Reason for Consultation atrial fibrillation  HPI: 82 year old gentleman with history of diabetes, essential hypertension, and stage IV chronic kidney disease, referred for atrial fibrillation with rapid ventricular rate. The patient presented to [facility] emergency room with chief complaint of one day history of nausea and vomiting. In the emergency room the patient was noted to be tachycardic, ECG revealed atrial fibrillation with a rapid ventricular rate. Admission labs were notable for borderline elevated troponin of 0.08 in the absence of chest pain. BUN and creatinine were 65 and 2.5, respectively. The patient was admitted to telemetry where her heart rate is improved on home Inderal and with the addition of Cardizem 60 mg 3 times a day. Nausea and vomiting have improved today, patient was able to eat breakfast.  Review of systems complete and found to be negative unless listed above     Past Medical History  Diagnosis Date  . High cholesterol   . Diabetes ([place])   . Hypertension   . Gout   . Tremor   . Bundle branch block, right   . Mitral valve prolapse   . CVA (cerebral infarction)   . Carotid stenosis   . Bladder cancer ([place])   . Prostate cancer ([place])   . Colon polyp     Past Surgical History  Procedure Laterality Date  . Appendectomy      Prescriptions prior to admission  Medication Sig Dispense Refill Last Dose  . allopurinol (ZYLOPRIM) 300 MG tablet Take 100 mg by mouth daily.   02/12/2016 at am   . amLODipine (NORVASC) 5 MG tablet Take 5 mg by mouth daily.   02/12/2016 at am   . atorvastatin (LIPITOR) 40 MG tablet Take 40 mg by mouth at bedtime.   02/12/2016 at pm   . cholestyramine (QUESTRAN) 4 g packet Take 4 g by mouth daily at 6 (six) AM.   02/12/2016 at pm   .  fexofenadine (ALLEGRA) 180 MG tablet Take 180 mg by mouth as needed for allergies or rhinitis.   prn at prn  . insulin lispro protamine-lispro (HUMALOG 75/25 MIX) (75-25) 100 UNIT/ML SUSP injection Inject 10 Units into the skin daily before supper.   02/12/2016 at pm   . losartan (COZAAR) 50 MG tablet Take 50 mg by mouth daily.   02/12/2016 at am   . metolazone (ZAROXOLYN) 2.5 MG tablet Take 2.5 mg by mouth every other day.    unknown at unknown  . primidone (MYSOLINE) 50 MG tablet Take 50 mg by mouth 2 (two) times daily.    02/12/2016 at pm   . propranolol (INDERAL) 20 MG tablet Take 20 mg by mouth 2 (two) times daily.   02/12/2016 at pm   . ranitidine (ZANTAC) 150 MG tablet Take 300 mg by mouth every evening.    02/12/2016 at pm   . sitaGLIPtin (JANUVIA) 100 MG tablet Take 100 mg by mouth daily.   02/12/2016 at am  . torsemide (DEMADEX) 20 MG tablet Take 40 mg by mouth daily.   02/12/2016 at am   Social History   Social History  . Marital Status: Married    Spouse Name: N/A  . Number of Children: N/A  . Years of Education: N/A   Occupational History  . Not on file.   Social History Main Topics  . Smoking status:  Former Smoker    Types: Pipe    Quit date: 10/29/2010  . Smokeless tobacco: Not on file  . Alcohol Use: Yes  . Drug Use: Not on file  . Sexual Activity: Not on file   Other Topics Concern  . Not on file   Social History Narrative    Family History  Problem Relation Age of Onset  . Hypertension Father   . Stroke Father   . Diabetes Mother       Review of systems complete and found to be negative unless listed above      PHYSICAL EXAM  General: Well developed, well nourished, in no acute distress HEENT:  Normocephalic and atramatic Neck:  No JVD.  Lungs: Clear bilaterally to auscultation and percussion. Heart: HRRR . Normal S1 and S2 without gallops or murmurs.  Abdomen: Bowel sounds are positive, abdomen soft and non-tender  Msk:  Back normal, normal gait.  Normal strength and tone for age. Extremities: No clubbing, cyanosis or edema.   Neuro: Alert and oriented X 3. Psych:  Good affect, responds appropriately  Labs:   Lab Results  Component Value Date   WBC 10.2 02/13/2016   HGB 13.2 02/13/2016   HCT 38.5* 02/13/2016   MCV 94.6 02/13/2016   PLT 217 02/13/2016    Recent Labs Lab 02/12/16 1618 02/13/16 0415  NA 137 138  K 3.1* 3.9  CL 96* 98*  CO2 27 32  BUN 72* 65*  CREATININE 2.79* 2.58*  CALCIUM 8.9 8.5*  PROT 7.2  --   BILITOT 0.5  --   ALKPHOS 61  --   ALT 19  --   AST 31  --   GLUCOSE 218* 127*   Lab Results  Component Value Date   CKTOTAL 149 02/27/2013   CKMB 1.0 05/25/2013   TROPONINI 0.08* 02/13/2016   No results found for: CHOL No results found for: HDL No results found for: LDLCALC No results found for: TRIG No results found for: CHOLHDL No results found for: LDLDIRECT    Radiology: Dg Abd 1 View  02/13/2016  CLINICAL DATA:  Follow-up ileus.  Appendectomy. EXAM: ABDOMEN - 1 VIEW COMPARISON:  02/12/2016 abdominal radiographs. FINDINGS: There are no disproportionately dilated small bowel loops or significant air-fluid levels. There is mild gaseous distention of the colon. No evidence of pneumatosis or pneumoperitoneum. Surgical clips in the right upper quadrant of the abdomen. IMPRESSION: Nonobstructive bowel gas pattern. Small bowel ileus appears resolved. Electronically Signed   By: Ilona Sorrel M.D.   On: 02/13/2016 08:27   Dg Abd 2 Views  02/12/2016  CLINICAL DATA:  Nausea and vomiting EXAM: ABDOMEN - 2 VIEW COMPARISON:  CT scan from 03/02/2015. FINDINGS: Upright film shows no evidence for intraperitoneal free air. Supine film shows no overt gaseous small bowel dilatation. There is distention of central small bowel which appears air-filled. Surgical clips in the right upper quadrant suggest prior cholecystectomy. Visualized bony anatomy is unremarkable. Rounded density in the right abdomen likely related to  right renal cystic disease seen on previous CT scan. IMPRESSION: No evidence for intraperitoneal free air or bowel obstruction. Small bowel appearance raises the question of an underlying component of ileus. Electronically Signed   By: Misty Stanley M.D.   On: 02/12/2016 16:43    EKG: Atrial fibrillation with a rapid ventricular rate  ASSESSMENT AND PLAN:   1. Atrial fibrillation with rapid ventricular rate which is improved with the addition of Cardizem. Chads Vasc is 6, currently on  heparin drip. The patient has known stage IV chronic kidney disease with GFR 24. Atrial fibrillation likely triggered by underlying GI disturbance, possible viral gastroenteritis, with ileus, nausea and vomiting, which appears to be improving. 2. Hypertension, stable  Recommendations  1. Change Cardizem to Cardizem CD 240 mg daily 2. DC heparin 3. Start warfarin 5 mg daily for stroke prevention and nonvalvular atrial fibrillation in patient with moderate to severe CKD. Target INR 2.0-3.0. This can be followed up as outpatient. 4. Review 2-D echocardiogram    Signed: Davaris Youtsey MD,PhD, [facility] 02/13/2016, 8:58 AM

## 2016-02-13 NOTE — Progress Notes (Signed)
A fib. Room air. Pt reports no chest pain. FS are stable. Foley was d/c and able to void standing into urinal. Wife at the bedside. Heparin drip rate was changed. Pt has no further concerns at this time.

## 2016-02-13 NOTE — Care Management (Signed)
Patient from [facility] independent living level of care.  He is independent in his adls, uses a walker, no chronic home 02 and chooses not to drive.  Denies problems accessing medical care, paying for medications and transportation.  Current with his PCP- Governor Specking.  He would be agreeable to home health nurse and physical therapy if it is needed.  He says on previous occassions, he has had to go "to the health care building" after a hospitalization and would also be open to that option if it was needed.  Patient is currently on a heparin drip and in atrial fib with heart rate in the 140s.  Requiring adjustments to cardiac meds

## 2016-02-13 NOTE — Progress Notes (Signed)
Heparin level high, infusion turned off from 0500 to 0600. Heparin now running at 9 mL/hour. No bruises/hematoma/bleeding noted. Will continue to monitor.

## 2016-02-13 NOTE — Progress Notes (Signed)
ANTICOAGULATION CONSULT NOTE - Initial Consult  Pharmacy Consult for heparin drip dosing and monitoring Indication: atrial fibrillation  Allergies  Allergen Reactions  . Demerol [Meperidine] Nausea And Vomiting    Patient Measurements: Height: 6\' 1"  (185.4 cm) Weight: 169 lb 9.6 oz (76.93 kg) IBW/kg (Calculated) : 79.9 Heparin Dosing Weight: 81.6 kg  Vital Signs: Temp: 98.4 F (36.9 C) (03/12 2158) Temp Source: Oral (03/12 2158) BP: 133/81 mmHg (03/12 2158) Pulse Rate: 98 (03/12 2158)  Labs:  Recent Labs  02/12/16 1618 02/12/16 1619 02/12/16 1845 02/13/16 0415  HGB 14.6  --   --  13.2  HCT 43.7  --   --  38.5*  PLT 253  --   --  217  APTT  --  28  --   --   LABPROT  --  13.5  --   --   INR  --  1.01  --   --   HEPARINUNFRC  --   --   --  1.20*  CREATININE 2.79*  --   --  2.58*  TROPONINI 0.08*  --  0.10* 0.08*    Estimated Creatinine Clearance: 22.8 mL/min (by C-G formula based on Cr of 2.58).  Assessment: Pharmacy consulted to dose and monitor heparin drip in this 82 year old male being admitted with new onset atrial fibrillation. Patient was not taking anticoagulants prior to admission.  Baseline labs: Hgb 14.6 Plt 253  APTT and protime/INR ordered STAT  Goal of Therapy:  Heparin level 0.3-0.7 units/ml Monitor platelets by anticoagulation protocol: Yes   Plan:  Give 4000 units bolus x 1 (~50 units/kg) Start heparin infusion at 1150 units/hr (~14 units/kg/hr) Check anti-Xa level in 8 hours and daily while on heparin Continue to monitor H&H and platelets  3/13 AM heparin level 1.2. Hold infusion x 1 hour and restart at 900 units/hr. Recheck heparin level 8 hours after restart.  Thank you for the consult.  Eloise Harman, PharmD Clinical Pharmacist 02/13/2016,5:04 AM

## 2016-02-13 NOTE — Progress Notes (Signed)
CCMD called about HR in 140s SVT and runs of a fib.

## 2016-02-13 NOTE — Progress Notes (Signed)
ANTICOAGULATION CONSULT NOTE - Follow Up Consult  Pharmacy Consult for heparin drip dosing and monitoring Indication: atrial fibrillation  Allergies  Allergen Reactions  . Demerol [Meperidine] Nausea And Vomiting    Patient Measurements: Height: 6\' 1"  (185.4 cm) Weight: 169 lb 14.4 oz (77.066 kg) IBW/kg (Calculated) : 79.9 Heparin Dosing Weight: 77.1 kg  Vital Signs: Temp: 97.9 F (36.6 C) (03/13 1122) Temp Source: Oral (03/13 1122) BP: 109/69 mmHg (03/13 1122) Pulse Rate: 76 (03/13 1122)  Labs:  Recent Labs  02/12/16 1618 02/12/16 1619 02/12/16 1845 02/13/16 0415 02/13/16 1413  HGB 14.6  --   --  13.2  --   HCT 43.7  --   --  38.5*  --   PLT 253  --   --  217  --   APTT  --  28  --   --   --   LABPROT  --  13.5  --   --   --   INR  --  1.01  --   --   --   HEPARINUNFRC  --   --   --  1.20* 0.73*  CREATININE 2.79*  --   --  2.58*  --   TROPONINI 0.08*  --  0.10* 0.08*  --     Estimated Creatinine Clearance: 22.8 mL/min (by C-G formula based on Cr of 2.58).  Assessment: Pharmacy consulted to dose and monitor heparin drip in this 82 year old male being admitted with new onset atrial fibrillation. Patient was not taking anticoagulants prior to admission.  Heparin drip currently infusing at 900 units/hr  3/13 HL at 1413: 0.73  Goal of Therapy:  Heparin level 0.3-0.7 units/ml Monitor platelets by anticoagulation protocol: Yes   Plan:  HL slightly above range.  Will decrease heparin drip rate by 1 unit/kg/hr to 800 units/hr and recheck a HL in 8 hours at 2330.  CBC ordered in AM.  Thank you for the consult.  Loleta Dicker, PharmD Clinical Pharmacist 02/13/2016,3:12 PM

## 2016-02-13 NOTE — Progress Notes (Signed)
ANTICOAGULATION CONSULT NOTE - Initial Consult  Pharmacy Consult for warfarin Indication: atrial fibrillation  Allergies  Allergen Reactions  . Demerol [Meperidine] Nausea And Vomiting    Patient Measurements: Height: 6\' 1"  (185.4 cm) Weight: 169 lb 14.4 oz (77.066 kg) IBW/kg (Calculated) : 79.9   Vital Signs: Temp: 97.9 F (36.6 C) (03/13 1122) Temp Source: Oral (03/13 1122) BP: 109/69 mmHg (03/13 1122) Pulse Rate: 76 (03/13 1122)  Labs:  Recent Labs  02/12/16 1618 02/12/16 1619 02/12/16 1845 02/13/16 0415  HGB 14.6  --   --  13.2  HCT 43.7  --   --  38.5*  PLT 253  --   --  217  APTT  --  28  --   --   LABPROT  --  13.5  --   --   INR  --  1.01  --   --   HEPARINUNFRC  --   --   --  1.20*  CREATININE 2.79*  --   --  2.58*  TROPONINI 0.08*  --  0.10* 0.08*    Estimated Creatinine Clearance: 22.8 mL/min (by C-G formula based on Cr of 2.58).   Medical History: Past Medical History  Diagnosis Date  . High cholesterol   . Diabetes ([place])   . Hypertension   . Gout   . Tremor   . Bundle branch block, right   . Mitral valve prolapse   . CVA (cerebral infarction)   . Carotid stenosis   . Bladder cancer ([place])   . Prostate cancer ([place])   . Colon polyp     Medications:  Scheduled:  . allopurinol  100 mg Oral Daily  . atorvastatin  40 mg Oral QHS  . bisacodyl  10 mg Rectal Once  . cholestyramine  4 g Oral Q0600  . diltiazem  240 mg Oral Daily  . docusate sodium  100 mg Oral BID  . [START ON 02/14/2016] famotidine  20 mg Oral QAC supper  . insulin aspart  0-15 Units Subcutaneous TID WC  . insulin aspart  0-5 Units Subcutaneous QHS  . linagliptin  5 mg Oral Daily  . losartan  50 mg Oral Daily  . primidone  50 mg Oral BID  . propranolol  20 mg Oral BID  . sodium chloride flush  3 mL Intravenous Q12H  . torsemide  40 mg Oral Daily  . [START ON 02/14/2016] warfarin  2.5 mg Oral Daily  . Warfarin - Pharmacist Dosing Inpatient   Does not apply q1800    Infusions:  . 0.9 % NaCl with KCl 20 mEq / L 50 mL/hr at 02/12/16 2253  . heparin 900 Units/hr (02/13/16 0600)    Assessment: Pharmacy consulted to dose warfarin in an 82 yo male with atrial fibrillation.  Patient currently on a heparin drip at 900 units/hr.   INR on 3/12 of 1.01  Goal of Therapy:  INR 2-3 Monitor platelets by anticoagulation protocol: Yes   Plan:  Patient received warfarin 5 mg today.  Will transition patient to warfarin 2.5 mg po daily based on age >66 yo and patient is also on allopurinol which may enhance the anticoagulant effect of warfarin.  Will follow up with INR in AM.  Suprena Travaglini G 02/13/2016,1:46 PM

## 2016-02-13 NOTE — Progress Notes (Signed)
ANTICOAGULATION CONSULT NOTE - Follow Up Consult  Pharmacy Consult for heparin drip dosing and monitoring Indication: atrial fibrillation  Allergies  Allergen Reactions  . Demerol [Meperidine] Nausea And Vomiting    Patient Measurements: Height: 6\' 1"  (185.4 cm) Weight: 169 lb 14.4 oz (77.066 kg) IBW/kg (Calculated) : 79.9 Heparin Dosing Weight: 77.1 kg  Vital Signs: Temp: 97.8 F (36.6 C) (03/13 1959) Temp Source: Oral (03/13 1959) BP: 130/78 mmHg (03/13 1959) Pulse Rate: 51 (03/13 1959)  Labs:  Recent Labs  02/12/16 1618 02/12/16 1619 02/12/16 1845 02/13/16 0415 02/13/16 1413 02/13/16 2255  HGB 14.6  --   --  13.2  --   --   HCT 43.7  --   --  38.5*  --   --   PLT 253  --   --  217  --   --   APTT  --  28  --   --   --   --   LABPROT  --  13.5  --   --   --   --   INR  --  1.01  --   --   --   --   HEPARINUNFRC  --   --   --  1.20* 0.73* 0.44  CREATININE 2.79*  --   --  2.58*  --   --   TROPONINI 0.08*  --  0.10* 0.08*  --   --     Estimated Creatinine Clearance: 22.8 mL/min (by C-G formula based on Cr of 2.58).  Assessment: Pharmacy consulted to dose and monitor heparin drip in this 82 year old male being admitted with new onset atrial fibrillation. Patient was not taking anticoagulants prior to admission.  Heparin drip currently infusing at 800 units/hr  3/13 HL at 1413: 0.73 on 900 units/hr 3/13 HL at 2255: 0.44 - RN confirms heparin drip running at 800 units/hr, no line issues or s/sx of bleeding noted per RN  Goal of Therapy:  Heparin level 0.3-0.7 units/ml Monitor platelets by anticoagulation protocol: Yes   Plan:  Heparin level within goal range.  Will continue current rate and recheck a HL in 8 hours to confirm.  CBC ordered in AM.  Thank you for the consult.  Rocky Morel, PharmD Clinical Pharmacist 02/13/2016,11:55 PM

## 2016-02-13 NOTE — Progress Notes (Signed)
Patient has been struggling to urinate  and all his attempts were unsuccessful. Was able to urinate 100 mL x 1. Bladder scan done and obtained a results of >9999. DR. Marcille Blanco notified with a new order for Foley cath to be put since patient is requesting that the foley be left in. No acute distress noted. Patient denied pain. Will continue to monitor.

## 2016-02-13 NOTE — Progress Notes (Signed)
*  PRELIMINARY RESULTS* Echocardiogram 2D Echocardiogram has been performed.  Dean Garcia 02/13/2016, 2:18 PM

## 2016-02-13 NOTE — Progress Notes (Signed)
[facility] NAME: Case Lammers    MR#:  [id number]  DATE OF BIRTH:  07-Apr-1928  SUBJECTIVE:  No complaints this morning denies chest pain or palpitations On telemetry intermittent runs of rapid atrial fibrillation versus SVT  REVIEW OF SYSTEMS:  CONSTITUTIONAL: No fever, fatigue or weakness.  EYES: No blurred or double vision.  EARS, NOSE, AND THROAT: No tinnitus or ear pain.  RESPIRATORY: No cough, shortness of breath, wheezing or hemoptysis.  CARDIOVASCULAR: No chest pain, orthopnea, edema.  GASTROINTESTINAL: Positive nausea, vomiting, denies diarrhea or abdominal pain.  GENITOURINARY: No dysuria, hematuria.  ENDOCRINE: No polyuria, nocturia,  HEMATOLOGY: No anemia, easy bruising or bleeding SKIN: No rash or lesion. MUSCULOSKELETAL: No joint pain or arthritis.   NEUROLOGIC: No tingling, numbness, weakness.  PSYCHIATRY: No anxiety or depression.   DRUG ALLERGIES:   Allergies  Allergen Reactions  . Demerol [Meperidine] Nausea And Vomiting    VITALS:  Blood pressure 109/69, pulse 76, temperature 97.9 F (36.6 C), temperature source Oral, resp. rate 20, height 6\' 1"  (1.854 m), weight 169 lb 14.4 oz (77.066 kg), SpO2 93 %.  PHYSICAL EXAMINATION:  VITAL SIGNS: Filed Vitals:   02/13/16 0725 02/13/16 1122  BP: 112/74 109/69  Pulse: 67 76  Temp:  97.9 F (36.6 C)  Resp: 20    GENERAL:82 y.o.male currently in no acute distress.  HEAD: Normocephalic, atraumatic.  EYES: Pupils equal, round, reactive to light. Extraocular muscles intact. No scleral icterus.  MOUTH: Moist mucosal membrane. Dentition intact. No abscess noted.  EAR, NOSE, THROAT: Clear without exudates. No external lesions.  NECK: Supple. No thyromegaly. No nodules. No JVD.  PULMONARY: Clear to ascultation, without wheeze rails or rhonci. No use of accessory muscles, Good respiratory effort. good air entry bilaterally CHEST: Nontender to palpation.   CARDIOVASCULAR: S1 and S2. Irregular rate and rhythm. No murmurs, rubs, or gallops. No edema. Pedal pulses 2+ bilaterally.  GASTROINTESTINAL: Soft, nontender, nondistended. No masses. Positive bowel sounds. No hepatosplenomegaly.  MUSCULOSKELETAL: No swelling, clubbing, or edema. Range of motion full in all extremities.  NEUROLOGIC: Cranial nerves II through XII are intact. No gross focal neurological deficits. Sensation intact. Reflexes intact.  SKIN: No ulceration, lesions, rashes, or cyanosis. Skin warm and dry. Turgor intact.  PSYCHIATRIC: Mood, affect within normal limits. The patient is awake, alert and oriented x 3. Insight, judgment intact.      LABORATORY PANEL:   CBC  Recent Labs Lab 02/13/16 0415  WBC 10.2  HGB 13.2  HCT 38.5*  PLT 217   ------------------------------------------------------------------------------------------------------------------  Chemistries   Recent Labs Lab 02/12/16 1618 02/13/16 0415  NA 137 138  K 3.1* 3.9  CL 96* 98*  CO2 27 32  GLUCOSE 218* 127*  BUN 72* 65*  CREATININE 2.79* 2.58*  CALCIUM 8.9 8.5*  AST 31  --   ALT 19  --   ALKPHOS 61  --   BILITOT 0.5  --    ------------------------------------------------------------------------------------------------------------------  Cardiac Enzymes  Recent Labs Lab 02/13/16 0415  TROPONINI 0.08*   ------------------------------------------------------------------------------------------------------------------  RADIOLOGY:  Dg Abd 1 View  02/13/2016  CLINICAL DATA:  Follow-up ileus.  Appendectomy. EXAM: ABDOMEN - 1 VIEW COMPARISON:  02/12/2016 abdominal radiographs. FINDINGS: There are no disproportionately dilated small bowel loops or significant air-fluid levels. There is mild gaseous distention of the colon. No evidence of pneumatosis or pneumoperitoneum. Surgical clips in the right upper quadrant of the abdomen. IMPRESSION: Nonobstructive bowel gas pattern. Small bowel ileus  appears resolved. Electronically Signed   By: Ilona Sorrel M.D.   On: 02/13/2016 08:27   Dg Abd 2 Views  02/12/2016  CLINICAL DATA:  Nausea and vomiting EXAM: ABDOMEN - 2 VIEW COMPARISON:  CT scan from 03/02/2015. FINDINGS: Upright film shows no evidence for intraperitoneal free air. Supine film shows no overt gaseous small bowel dilatation. There is distention of central small bowel which appears air-filled. Surgical clips in the right upper quadrant suggest prior cholecystectomy. Visualized bony anatomy is unremarkable. 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 IMPRESSION: No evidence for intraperitoneal free air or bowel obstruction. Small bowel appearance raises the question of an underlying component of ileus. Electronically Signed   By: Misty Stanley M.D.   On: 02/12/2016 16:43    EKG:   Orders placed or performed during the hospital encounter of 02/12/16  . EKG 12-Lead  . EKG 12-Lead    ASSESSMENT AND PLAN:   82 year old Caucasian gentleman history of essential hypertension, type 2 diabetes, mitral valve prolapse admitted to the hospital with apparent new onset atrial fibrillation 02/12/16  1. New onset atrial fibrillation: Cardiology input appreciated, echocardiogram pending at this time Cardizem dose increased this morning, started on warfarin -pharmacy to follow 2. Gastroenteritis: Symptoms have resolved continue supportive care 3. Essential hypertension Norvasc 4. Hyperlipidemia unspecified Lipitor      All the records are reviewed and case discussed with Care Management/Social Workerr. Management plans discussed with the patient, family and they are in agreement.  CODE STATUS: Full  TOTAL TIME TAKING CARE OF THIS PATIENT: 28 minutes.   POSSIBLE D/C IN 1 DAYS, DEPENDING ON CLINICAL CONDITION.   Amayra Kiedrowski,  Karenann Cai.D on 02/13/2016 at 12:36 PM  Between 7am to 6pm - Pager - [phone]  After 6pm: House  Pager: - [phone]  [facility]  Office  [phone]  CC: Primary care physician; Kirk Ruths., MD

## 2016-02-14 DIAGNOSIS — I4891 Unspecified atrial fibrillation: Secondary | ICD-10-CM | POA: Diagnosis not present

## 2016-02-14 LAB — ECHOCARDIOGRAM COMPLETE
HEIGHTINCHES: 73 in
Weight: 2718.4 oz

## 2016-02-14 LAB — PROTIME-INR
INR: 1.06
PROTHROMBIN TIME: 14 s (ref 11.4–15.0)

## 2016-02-14 LAB — CBC
HEMATOCRIT: 36.7 % — AB (ref 40.0–52.0)
HEMOGLOBIN: 12.3 g/dL — AB (ref 13.0–18.0)
MCH: 32 pg (ref 26.0–34.0)
MCHC: 33.6 g/dL (ref 32.0–36.0)
MCV: 95.3 fL (ref 80.0–100.0)
Platelets: 190 10*3/uL (ref 150–440)
RBC: 3.86 MIL/uL — AB (ref 4.40–5.90)
RDW: 14.1 % (ref 11.5–14.5)
WBC: 6.8 10*3/uL (ref 3.8–10.6)

## 2016-02-14 LAB — HEPARIN LEVEL (UNFRACTIONATED): Heparin Unfractionated: 0.38 IU/mL (ref 0.30–0.70)

## 2016-02-14 LAB — GLUCOSE, CAPILLARY
GLUCOSE-CAPILLARY: 169 mg/dL — AB (ref 65–99)
Glucose-Capillary: 220 mg/dL — ABNORMAL HIGH (ref 65–99)

## 2016-02-14 MED ORDER — DILTIAZEM HCL ER COATED BEADS 240 MG PO CP24: 240.0000 mg | ORAL_CAPSULE | Freq: Every day | ORAL | Status: DC

## 2016-02-14 MED ORDER — ENOXAPARIN SODIUM 30 MG/0.3ML ~~LOC~~ SOLN: 30.0000 mg | SUBCUTANEOUS | Status: DC

## 2016-02-14 MED ORDER — WARFARIN SODIUM 2.5 MG PO TABS: 2.5000 mg | ORAL_TABLET | Freq: Every day | ORAL | Status: DC

## 2016-02-14 NOTE — Discharge Summary (Signed)
[facility] [place] NAME: Dean Garcia    MR#:  [id number]  DATE OF BIRTH:  Oct 10, 1928  DATE OF ADMISSION:  02/12/2016 ADMITTING PHYSICIAN: Hillary Bow, MD  DATE OF DISCHARGE: No discharge date for patient encounter.  PRIMARY CARE PHYSICIAN: Kirk Ruths., MD    ADMISSION DIAGNOSIS:  Atrial fibrillation rapid ventricular response  DISCHARGE DIAGNOSIS:  Atrial fibrillation rapid ventricular response  SECONDARY DIAGNOSIS:   Past Medical History  Diagnosis Date  . High cholesterol   . Diabetes ([place])   . Hypertension   . Gout   . Tremor   . Bundle branch block, right   . Mitral valve prolapse   . CVA (cerebral infarction)   . Carotid stenosis   . Bladder cancer ([place])   . Prostate cancer ([place])   . Colon polyp     HOSPITAL COURSE:  Dean Garcia  is a 82 y.o. male admitted 02/12/2016 with chief complaint Nausea and vomiting. Please see H&P performed by Dr. Darvin Neighbours for further information. He originally presented to the hospital with one-day complaints of nausea as well as nonbloody nonbilious emesis. On arrival to the emergency department he was noted be in atrial fibrillation rapid ventricular response. Events of the cardiac floor placed on telemetry cardiology consult involved. Patient started on Cardizem as well as warfarin-currently remains in A. fib, rate controlled echocardiogram performed ejection fraction 55- 65% some right and left atrial dilatation present He will require Lovenox bridging to warfarin  DISCHARGE CONDITIONS:   Stable  CONSULTS OBTAINED:  Treatment Team:  Isaias Cowman, MD Teodoro Spray, MD  DRUG ALLERGIES:   Allergies  Allergen Reactions  . Demerol [Meperidine] Nausea And Vomiting    DISCHARGE MEDICATIONS:   Current Discharge Medication List    START taking these medications   Details  diltiazem (CARDIZEM CD) 240 MG 24 hr capsule Take 1 capsule (240 mg total) by mouth  daily. Qty: 30 capsule, Refills: 0    enoxaparin (LOVENOX) 30 MG/0.3ML injection Inject 0.3 mLs (30 mg total) into the skin daily. Qty: 7 Syringe, Refills: 0    warfarin (COUMADIN) 2.5 MG tablet Take 1 tablet (2.5 mg total) by mouth daily. Qty: 30 tablet, Refills: 0      CONTINUE these medications which have NOT CHANGED   Details  allopurinol (ZYLOPRIM) 300 MG tablet Take 100 mg by mouth daily.    amLODipine (NORVASC) 5 MG tablet Take 5 mg by mouth daily.    atorvastatin (LIPITOR) 40 MG tablet Take 40 mg by mouth at bedtime.    cholestyramine (QUESTRAN) 4 g packet Take 4 g by mouth daily at 6 (six) AM.    fexofenadine (ALLEGRA) 180 MG tablet Take 180 mg by mouth as needed for allergies or rhinitis.    insulin lispro protamine-lispro (HUMALOG 75/25 MIX) (75-25) 100 UNIT/ML SUSP injection Inject 10 Units into the skin daily before supper.    losartan (COZAAR) 50 MG tablet Take 50 mg by mouth daily.    metolazone (ZAROXOLYN) 2.5 MG tablet Take 2.5 mg by mouth every other day.     primidone (MYSOLINE) 50 MG tablet Take 50 mg by mouth 2 (two) times daily.     propranolol (INDERAL) 20 MG tablet Take 20 mg by mouth 2 (two) times daily.    ranitidine (ZANTAC) 150 MG tablet Take 300 mg by mouth every evening.     sitaGLIPtin (JANUVIA) 100 MG tablet Take 100 mg by mouth daily.  torsemide (DEMADEX) 20 MG tablet Take 40 mg by mouth daily.         DISCHARGE INSTRUCTIONS:  Continue daily Lovenox with INR testing results faxed to Dr. Ubaldo Glassing of cardiology  DIET:  Cardiac diet  DISCHARGE CONDITION:  Stable  ACTIVITY:  Activity as tolerated  OXYGEN:  Home Oxygen: No.   Oxygen Delivery: room air  DISCHARGE LOCATION:  nursing home   If you experience worsening of your admission symptoms, develop shortness of breath, life threatening emergency, suicidal or homicidal thoughts you must seek medical attention immediately by calling 911 or calling your MD immediately  if symptoms  less severe.  You Must read complete instructions/literature along with all the possible adverse reactions/side effects for all the Medicines you take and that have been prescribed to you. Take any new Medicines after you have completely understood and accpet all the possible adverse reactions/side effects.   Please note  You were cared for by a hospitalist during your hospital stay. If you have any questions about your discharge medications or the care you received while you were in the hospital after you are discharged, you can call the unit and asked to speak with the hospitalist on call if the hospitalist that took care of you is not available. Once you are discharged, your primary care physician will handle any further medical issues. Please note that NO REFILLS for any discharge medications will be authorized once you are discharged, as it is imperative that you return to your primary care physician (or establish a relationship with a primary care physician if you do not have one) for your aftercare needs so that they can reassess your need for medications and monitor your lab values.    On the day of Discharge:   VITAL SIGNS:  Blood pressure 119/85, pulse 87, temperature 97.9 F (36.6 C), temperature source Oral, resp. rate 20, height 6\' 1"  (1.854 m), weight 77.111 kg (170 lb), SpO2 99 %.  I/O:   Intake/Output Summary (Last 24 hours) at 02/14/16 1138 Last data filed at 02/14/16 1000  Gross per 24 hour  Intake 1663.16 ml  Output    950 ml  Net 713.16 ml    PHYSICAL EXAMINATION:  GENERAL:  82 y.o.-year-old patient lying in the bed with no acute distress.  EYES: Pupils equal, round, reactive to light and accommodation. No scleral icterus. Extraocular muscles intact.  HEENT: Head atraumatic, normocephalic. Oropharynx and nasopharynx clear.  NECK:  Supple, no jugular venous distention. No thyroid enlargement, no tenderness.  LUNGS: Normal breath sounds bilaterally, no wheezing,  rales,rhonchi or crepitation. No use of accessory muscles of respiration.  CARDIOVASCULAR: S1, S2 irregular rate and rhythm. No murmurs, rubs, or gallops.  ABDOMEN: Soft, non-tender, non-distended. Bowel sounds present. No organomegaly or mass.  EXTREMITIES: No pedal edema, cyanosis, or clubbing.  NEUROLOGIC: Cranial nerves II through XII are intact. Muscle strength 5/5 in all extremities. Sensation intact. Gait not checked.  PSYCHIATRIC: The patient is alert and oriented x 3.  SKIN: No obvious rash, lesion, or ulcer.   DATA REVIEW:   CBC  Recent Labs Lab 02/14/16 0651  WBC 6.8  HGB 12.3*  HCT 36.7*  PLT 190    Chemistries   Recent Labs Lab 02/12/16 1618 02/13/16 0415  NA 137 138  K 3.1* 3.9  CL 96* 98*  CO2 27 32  GLUCOSE 218* 127*  BUN 72* 65*  CREATININE 2.79* 2.58*  CALCIUM 8.9 8.5*  AST 31  --   ALT 19  --  ALKPHOS 61  --   BILITOT 0.5  --     Cardiac Enzymes  Recent Labs Lab 02/13/16 0415  TROPONINI 0.08*    Microbiology Results  Results for orders placed or performed during the hospital encounter of 03/02/15  Culture, blood (single)     Status: None   Collection Time: 03/02/15  4:25 PM  Result Value Ref Range Status   Micro Text Report   Final       COMMENT                   NO GROWTH AEROBICALLY/ANAEROBICALLY IN 5 DAYS   ANTIBIOTIC                                                      Culture, blood (single)     Status: None   Collection Time: 03/02/15  4:42 PM  Result Value Ref Range Status   Micro Text Report   Final       COMMENT                   NO GROWTH AEROBICALLY/ANAEROBICALLY IN 5 DAYS   ANTIBIOTIC                                                        RADIOLOGY:  Dg Abd 1 View  02/13/2016  CLINICAL DATA:  Follow-up ileus.  Appendectomy. EXAM: ABDOMEN - 1 VIEW COMPARISON:  02/12/2016 abdominal radiographs. FINDINGS: There are no disproportionately dilated small bowel loops or significant air-fluid levels. There is mild  gaseous distention of the colon. No evidence of pneumatosis or pneumoperitoneum. Surgical clips in the right upper quadrant of the abdomen. IMPRESSION: Nonobstructive bowel gas pattern. Small bowel ileus appears resolved. Electronically Signed   By: Ilona Sorrel M.D.   On: 02/13/2016 08:27   Dg Abd 2 Views  02/12/2016  CLINICAL DATA:  Nausea and vomiting EXAM: ABDOMEN - 2 VIEW COMPARISON:  CT scan from 03/02/2015. FINDINGS: Upright film shows no evidence for intraperitoneal free air. Supine film shows no overt gaseous small bowel dilatation. There is distention of central small bowel which appears air-filled. Surgical clips in the right upper quadrant suggest prior cholecystectomy. Visualized bony anatomy is unremarkable. 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 IMPRESSION: No evidence for intraperitoneal free air or bowel obstruction. Small bowel appearance raises the question of an underlying component of ileus. Electronically Signed   By: Misty Stanley M.D.   On: 02/12/2016 16:43     Management plans discussed with the patient, family and they are in agreement.  CODE STATUS:     Code Status Orders        Start     Ordered   02/12/16 2029  Do not attempt resuscitation (DNR)   Continuous    Question Answer Comment  In the event of cardiac or respiratory ARREST Do not call a "code blue"   In the event of cardiac or respiratory ARREST Do not perform Intubation, CPR, defibrillation or ACLS   In the event of cardiac or respiratory ARREST Use medication by any route,  position, wound care, and other measures to relive pain and suffering. May use oxygen, suction and manual treatment of airway obstruction as needed for comfort.      02/12/16 2030    Code Status History    Date Active Date Inactive Code Status Order ID Comments User Context   This patient has a current code status but no historical code status.      TOTAL TIME  TAKING CARE OF THIS PATIENT: 33 minutes.    Dean Garcia,  Karenann Cai.D on 02/14/2016 at 11:38 AM  Between 7am to 6pm - Pager - [phone]  After 6pm go to [email or web address] - password [email or web address] [facility]  Office  [phone]  CC: Primary care physician; Kirk Ruths., MD

## 2016-02-14 NOTE — Progress Notes (Signed)
ANTICOAGULATION CONSULT NOTE - Follow up [facility] for warfarin Indication: atrial fibrillation  Allergies  Allergen Reactions  . Demerol [Meperidine] Nausea And Vomiting   Patient Measurements: Height: 6\' 1"  (185.4 cm) Weight: 170 lb (77.111 kg) IBW/kg (Calculated) : 79.9  Vital Signs: Temp: 97.9 F (36.6 C) (03/14 0532) BP: 116/74 mmHg (03/14 0532) Pulse Rate: 76 (03/14 0532)   Recent Labs  02/12/16 1618 02/12/16 1619 02/12/16 1845  02/13/16 0415 02/13/16 1413 02/13/16 2255 02/14/16 0651  HGB 14.6  --   --   --  13.2  --   --  12.3*  HCT 43.7  --   --   --  38.5*  --   --  36.7*  PLT 253  --   --   --  217  --   --  190  APTT  --  28  --   --   --   --   --   --   LABPROT  --  13.5  --   --   --   --   --  14.0  INR  --  1.01  --   --   --   --   --  1.06  HEPARINUNFRC  --   --   --   < > 1.20* 0.73* 0.44 0.38  CREATININE 2.79*  --   --   --  2.58*  --   --   --   TROPONINI 0.08*  --  0.10*  --  0.08*  --   --   --   < > = values in this interval not displayed.  Estimated Creatinine Clearance: 22.8 mL/min (by C-G formula based on Cr of 2.58).   Medications:  Scheduled:  . allopurinol  100 mg Oral Daily  . atorvastatin  40 mg Oral QHS  . bisacodyl  10 mg Rectal Once  . cholestyramine  4 g Oral Q0600  . diltiazem  240 mg Oral Daily  . docusate sodium  100 mg Oral BID  . famotidine  20 mg Oral QAC supper  . insulin aspart  0-15 Units Subcutaneous TID WC  . insulin aspart  0-5 Units Subcutaneous QHS  . linagliptin  5 mg Oral Daily  . losartan  50 mg Oral Daily  . primidone  50 mg Oral BID  . propranolol  20 mg Oral BID  . sodium chloride flush  3 mL Intravenous Q12H  . torsemide  40 mg Oral Daily  . warfarin  2.5 mg Oral Daily  . Warfarin - Pharmacist Dosing Inpatient   Does not apply q1800   Infusions:  . 0.9 % NaCl with KCl 20 mEq / L 50 mL/hr at 02/13/16 2140  . heparin 800 Units/hr (02/13/16 2134)    Assessment: Pharmacy  consulted to dose warfarin in an 82 yo male with atrial fibrillation.  Patient currently on a heparin drip at 800 units/hr with two therapeutic HLs in past 24 hours.   INR 3/12: 1.01 INR 3/14: 1.06  Goal of Therapy:  INR 2-3 Monitor platelets by anticoagulation protocol: Yes   Plan:  Patient received warfarin 5 mg  Yesterday,  will  transition patient to warfarin 2.5 mg po today  based on age >80 yo and patient is also on allopurinol which may enhance the anticoagulant effect of warfarin.  Expect to start to see change in INR in next 24-48 hours.  Will follow up with INR in AM.   Aiyannah Fayad,  PharmD Pharmacy Resident 02/14/2016,8:49 AM

## 2016-02-14 NOTE — Progress Notes (Signed)
Discharge instructions given to patient and wife. Education given on all new meds and afib. Patient instructed on lovenox injections and is comfortable giving them to himself since he gives himself insulin in his stomach at home. Home health is set up per care management. Care management has sent lovenox injection and primary RN faxed other two prescriptions to [facility]. Patient will follow up with PCP and cardiology. No questions at this time. IV and tele removed.

## 2016-02-14 NOTE — Progress Notes (Signed)
ANTICOAGULATION CONSULT NOTE - Follow Up Consult  Pharmacy Consult for heparin drip dosing and monitoring Indication: atrial fibrillation  Allergies  Allergen Reactions  . Demerol [Meperidine] Nausea And Vomiting    Patient Measurements: Height: 6\' 1"  (185.4 cm) Weight: 170 lb (77.111 kg) IBW/kg (Calculated) : 79.9 Heparin Dosing Weight: 77.1 kg  Vital Signs: Temp: 97.9 F (36.6 C) (03/14 0532) BP: 116/74 mmHg (03/14 0532) Pulse Rate: 76 (03/14 0532)  Labs:  Recent Labs  02/12/16 1618 02/12/16 1619 02/12/16 1845  02/13/16 0415 02/13/16 1413 02/13/16 2255 02/14/16 0651  HGB 14.6  --   --   --  13.2  --   --  12.3*  HCT 43.7  --   --   --  38.5*  --   --  36.7*  PLT 253  --   --   --  217  --   --  190  APTT  --  28  --   --   --   --   --   --   LABPROT  --  13.5  --   --   --   --   --  14.0  INR  --  1.01  --   --   --   --   --  1.06  HEPARINUNFRC  --   --   --   < > 1.20* 0.73* 0.44 0.38  CREATININE 2.79*  --   --   --  2.58*  --   --   --   TROPONINI 0.08*  --  0.10*  --  0.08*  --   --   --   < > = values in this interval not displayed.  Estimated Creatinine Clearance: 22.8 mL/min (by C-G formula based on Cr of 2.58).  Assessment: Pharmacy consulted to dose and monitor heparin drip in this 82 year old male being admitted with new onset atrial fibrillation. Patient was not taking anticoagulants prior to admission.  Heparin drip currently infusing at 800 units/hr  3/13 HL at 1413: 0.73 on 900 units/hr 3/13 HL at 2255: 0.44 - RN confirms heparin drip running at 800 units/hr, no line issues or s/sx of bleeding noted per RN 3/14 HL at 0651: 0.38  Goal of Therapy:  Heparin level 0.3-0.7 units/ml Monitor platelets by anticoagulation protocol: Yes   Plan:  Heparin level within goal range at 0.38 this morning and last night at 0.44. Two goal levels with in 24 hours so will begin monitoring heparin levels daily.Will continue current drip at  800 units/hour. HL  ordered with 0500 labs tomorrow AM.  Thank you for the consult.  Nancy Fetter, PharmD Pharmacy Resident 02/14/2016,8:38 AM

## 2016-02-14 NOTE — Care Management (Addendum)
Informed that patient to discharge home on Lovenox injections.  Discussed the need to instruct patient on lovenox injections.  Will need home health SN to monitor cardiac status and instruct on injections, draw protimes.  At present this has not been discussed with patient.  Faxed referral and received confirmation

## 2016-02-14 NOTE — Care Management (Signed)
Patient to discharge home today with home health nursing and lovenox injections.  Agency preference is [facility] as patient can be seen 3/15.  Agency will make initial visit 3/15 and draw protimes. Patient says he is able to  to self inject as he gives himself insulin.  His copay for his lovenox prescription is 42 dollars.

## 2016-02-14 NOTE — Progress Notes (Signed)
[facility]  SUBJECTIVE: I feel better   Filed Vitals:   02/13/16 0725 02/13/16 1122 02/13/16 1959 02/14/16 0532  BP: 112/74 109/69 130/78 116/74  Pulse: 67 76 51 76  Temp:  97.9 F (36.6 C) 97.8 F (36.6 C) 97.9 F (36.6 C)  TempSrc:  Oral Oral   Resp: 20  18 18   Height:      Weight:    77.111 kg (170 lb)  SpO2: 94% 93% 95% 96%     Intake/Output Summary (Last 24 hours) at 02/14/16 0840 Last data filed at 02/14/16 0532  Gross per 24 hour  Intake 1903.16 ml  Output    950 ml  Net 953.16 ml      PHYSICAL EXAM  General: Well developed, well nourished, in no acute distress HEENT:  Normocephalic and atramatic Neck:  No JVD.  Lungs: Clear bilaterally to auscultation and percussion. Heart: Irregularly irregular rhythm. Normal S1 and S2 without gallops or murmurs.  Abdomen: Bowel sounds are positive, abdomen soft and non-tender  Msk:  Back normal, normal gait. Normal strength and tone for age. Extremities: No clubbing, cyanosis or edema.   Neuro: Alert and oriented X 3. Psych:  Good affect, responds appropriately   LABS: Basic Metabolic Panel:  Recent Labs  02/12/16 1618 02/13/16 0415  NA 137 138  K 3.1* 3.9  CL 96* 98*  CO2 27 32  GLUCOSE 218* 127*  BUN 72* 65*  CREATININE 2.79* 2.58*  CALCIUM 8.9 8.5*   Liver Function Tests:  Recent Labs  02/12/16 1618  AST 31  ALT 19  ALKPHOS 61  BILITOT 0.5  PROT 7.2  ALBUMIN 3.5    Recent Labs  02/12/16 1618  LIPASE 25   CBC:  Recent Labs  02/12/16 1618 02/13/16 0415 02/14/16 0651  WBC 13.0* 10.2 6.8  NEUTROABS 11.0* 8.2*  --   HGB 14.6 13.2 12.3*  HCT 43.7 38.5* 36.7*  MCV 96.1 94.6 95.3  PLT 253 217 190   Cardiac Enzymes:  Recent Labs  02/12/16 1618 02/12/16 1845 02/13/16 0415  TROPONINI 0.08* 0.10* 0.08*   BNP: Invalid input(s): POCBNP D-Dimer: No results for input(s): DDIMER in the last 72 hours. Hemoglobin A1C: No results for input(s): HGBA1C in the last 72 hours. Fasting  Lipid Panel: No results for input(s): CHOL, HDL, LDLCALC, TRIG, CHOLHDL, LDLDIRECT in the last 72 hours. Thyroid Function Tests:  Recent Labs  02/13/16 0415  TSH 2.708   Anemia Panel: No results for input(s): VITAMINB12, FOLATE, FERRITIN, TIBC, IRON, RETICCTPCT in the last 72 hours.  Dg Abd 1 View  02/13/2016  CLINICAL DATA:  Follow-up ileus.  Appendectomy. EXAM: ABDOMEN - 1 VIEW COMPARISON:  02/12/2016 abdominal radiographs. FINDINGS: There are no disproportionately dilated small bowel loops or significant air-fluid levels. There is mild gaseous distention of the colon. No evidence of pneumatosis or pneumoperitoneum. Surgical clips in the right upper quadrant of the abdomen. IMPRESSION: Nonobstructive bowel gas pattern. Small bowel ileus appears resolved. Electronically Signed   By: Ilona Sorrel M.D.   On: 02/13/2016 08:27   Dg Abd 2 Views  02/12/2016  CLINICAL DATA:  Nausea and vomiting EXAM: ABDOMEN - 2 VIEW COMPARISON:  CT scan from 03/02/2015. FINDINGS: Upright film shows no evidence for intraperitoneal free air. Supine film shows no overt gaseous small bowel dilatation. There is distention of central small bowel which appears air-filled. Surgical clips in the right upper quadrant suggest prior cholecystectomy. Visualized bony anatomy is unremarkable. Rounded density in the right abdomen likely  related to right renal cystic disease seen on previous CT scan. IMPRESSION: No evidence for intraperitoneal free air or bowel obstruction. Small bowel appearance raises the question of an underlying component of ileus. Electronically Signed   By: Misty Stanley M.D.   On: 02/12/2016 16:43     Echo   TELEMETRY: Atrial fibrillation with controlled ventricular rate:  ASSESSMENT AND PLAN:  Active Problems:   A-fib ([place])    1. Atrial fibrillation with rapid ventricular rate, stabilized on current medications, likely exacerbated by viral gastroenteritis, dehydration 2. Ileus, probable viral  gastroenteritis, dehydration, resolved  Recommendations  1. Continue current medications 2. Continue warfarin, target INR 2.0-3.0, the patient can be followed up as outpatient with Dr. Ubaldo Glassing 3. Increase ambulation 4. Review 2-D echocardiogram   Isaias Cowman, MD, PhD, [facility] 02/14/2016 8:40 AM

## 2016-02-21 DIAGNOSIS — I480 Paroxysmal atrial fibrillation: Secondary | ICD-10-CM | POA: Insufficient documentation

## 2016-04-05 ENCOUNTER — Ambulatory Visit: Payer: Medicare Other | Admitting: Gastroenterology

## 2016-04-10 ENCOUNTER — Encounter: Payer: Self-pay | Admitting: Gastroenterology

## 2016-04-10 ENCOUNTER — Other Ambulatory Visit: Payer: Self-pay

## 2016-04-10 ENCOUNTER — Ambulatory Visit (INDEPENDENT_AMBULATORY_CARE_PROVIDER_SITE_OTHER): Payer: Medicare Other | Admitting: Gastroenterology

## 2016-04-10 VITALS — BP 86/42 | HR 63 | Temp 97.4°F | Ht 73.0 in | Wt 168.6 lb

## 2016-04-10 DIAGNOSIS — R159 Full incontinence of feces: Secondary | ICD-10-CM | POA: Diagnosis not present

## 2016-04-10 DIAGNOSIS — R197 Diarrhea, unspecified: Secondary | ICD-10-CM | POA: Diagnosis not present

## 2016-04-10 NOTE — Progress Notes (Signed)
Gastroenterology Consultation  Referring Provider:     Kirk Ruths, MD Primary Care Physician:  Dean Garcia., MD Primary Gastroenterologist:  Dr. Allen Garcia     Reason for Consultation:     Incontinence with abdominal bloating and diarrhea        HPI:   Dean Garcia is a 82 y.o. y/o male referred for consultation & management of  Incontinence with abdominal bloating and diarrhea by Dr. Kirk Garcia., MD.   This patient comes today with a report of chronic stool incontinence. The patient had seen Dr. Candace Garcia in the past and had an polymer injected in his rectum which he reports to have been very painful and resulting in being constipated and having to the emergency room. The patient now comes to me with continued stool incontinence and abdominal bloating with what he reports as a lot of noise coming from his belly. There is no report of any unexplained weight loss. The patient also denies any black stools or bloody stools. The patient has been wearing diapers for some time because of his stool incontinence. The white reports that the patient has had stool incontinence approximately once every two weeks but now is having every week. The patient has a diet filled with dairy products and he has been eating a lot of fruits recently because they have become in season. He also reports that he has a lot of abdominal bloating. The patient reports that he has cheese in his diet and milk with his cereal.   Past Medical History  Diagnosis Date  . High cholesterol   . Diabetes ([place])   . Hypertension   . Gout   . Tremor   . Bundle branch block, right   . Mitral valve prolapse   . CVA (cerebral infarction)   . Carotid stenosis   . Bladder cancer ([place])   . Prostate cancer ([place])   . Colon polyp     Past Surgical History  Procedure Laterality Date  . Appendectomy      Prior to Admission medications   Medication Sig Start Date End Date Taking? Authorizing Provider  allopurinol  (ZYLOPRIM) 300 MG tablet Take 100 mg by mouth daily.   Yes Historical Provider, MD  amLODipine (NORVASC) 5 MG tablet Take 5 mg by mouth daily.   Yes Historical Provider, MD  apixaban (ELIQUIS) 2.5 MG TABS tablet Take by mouth. 02/21/16  Yes Historical Provider, MD  aspirin 325 MG tablet Take 325 mg by mouth.   Yes Historical Provider, MD  atorvastatin (LIPITOR) 40 MG tablet Take 40 mg by mouth at bedtime.   Yes Historical Provider, MD  cholestyramine (QUESTRAN) 4 g packet Take 4 g by mouth daily at 6 (six) AM.   Yes Historical Provider, MD  cyanocobalamin (,VITAMIN B-12,) 1000 MCG/ML injection  10/31/15  Yes Historical Provider, MD  diltiazem (CARDIZEM CD) 240 MG 24 hr capsule Take 1 capsule (240 mg total) by mouth daily. 02/14/16  Yes Lytle Butte, MD  fexofenadine (ALLEGRA) 180 MG tablet Take 180 mg by mouth as needed for allergies or rhinitis.   Yes Historical Provider, MD  glucose blood (ACCU-CHEK COMPACT PLUS) test strip  12/07/15  Yes Historical Provider, MD  insulin lispro protamine-lispro (HUMALOG 75/25 MIX) (75-25) 100 UNIT/ML SUSP injection Inject 10 Units into the skin daily before supper.   Yes Historical Provider, MD  Insulin Pen Needle (B-D ULTRAFINE III SHORT PEN) 31G X 8 MM MISC  04/28/14  Yes Historical Provider, MD  losartan (COZAAR) 50 MG tablet Take 50 mg by mouth daily.   Yes Historical Provider, MD  metolazone (ZAROXOLYN) 2.5 MG tablet Take 2.5 mg by mouth every other day.    Yes Historical Provider, MD  omeprazole (PRILOSEC) 20 MG capsule Take by mouth. 03/09/16 03/09/17 Yes Historical Provider, MD  omeprazole (PRILOSEC) 20 MG capsule  04/09/16  Yes Historical Provider, MD  ondansetron (ZOFRAN-ODT) 4 MG disintegrating tablet Take by mouth.   Yes Historical Provider, MD  primidone (MYSOLINE) 50 MG tablet Take 50 mg by mouth 2 (two) times daily.    Yes Historical Provider, MD  propranolol (INDERAL) 20 MG tablet Take 20 mg by mouth 2 (two) times daily.   Yes Historical Provider, MD    ranitidine (ZANTAC) 150 MG tablet Take 300 mg by mouth every evening.    Yes Historical Provider, MD  sitaGLIPtin (JANUVIA) 100 MG tablet Take 100 mg by mouth daily.   Yes Historical Provider, MD  torsemide (DEMADEX) 20 MG tablet Take 40 mg by mouth daily.   Yes Historical Provider, MD  enoxaparin (LOVENOX) 30 MG/0.3ML injection Inject 0.3 mLs (30 mg total) into the skin daily. Patient not taking: Reported on 04/10/2016 02/14/16   Lytle Butte, MD  warfarin (COUMADIN) 2.5 MG tablet Take 1 tablet (2.5 mg total) by mouth daily. Patient not taking: Reported on 04/10/2016 02/14/16   Lytle Butte, MD    Family History  Problem Relation Age of Onset  . Hypertension Father   . Stroke Father   . Diabetes Mother      Social History  Substance Use Topics  . Smoking status: Former Smoker    Types: Pipe    Quit date: 10/29/2010  . Smokeless tobacco: Never Used  . Alcohol Use: Yes    Allergies as of 04/10/2016 - Review Complete 04/10/2016  Allergen Reaction Noted  . Demerol [meperidine] Nausea And Vomiting 02/12/2016    Review of Systems:    All systems reviewed and negative except where noted in HPI.   Physical Exam:  BP 86/42 mmHg  Pulse 63  Temp(Src) 97.4 F (36.3 C) (Oral)  Ht 6\' 1"  (1.854 m)  Wt 168 lb 9.6 oz (76.476 kg)  BMI 22.25 kg/m2 No LMP for male patient. Psych:  Alert and cooperative. Normal mood and affect. General:   Alert,  Well-developed, well-nourished, pleasant and cooperative in NAD Head:  Normocephalic and atraumatic. Eyes:  Sclera clear, no icterus.   Conjunctiva pink. Ears:  Normal auditory acuity. Nose:  No deformity, discharge, or lesions. Mouth:  No deformity or lesions,oropharynx pink & moist. Neck:  Supple; no masses or thyromegaly. Lungs:  Respirations even and unlabored.  Clear throughout to auscultation.   No wheezes, crackles, or rhonchi. No acute distress. Heart:  Regular rate and rhythm; no murmurs, clicks, rubs, or gallops. Abdomen:  Normal  bowel sounds.  No bruits.  Soft, non-tender and non-distended without masses, hepatosplenomegaly or hernias noted.  No guarding or rebound tenderness.  Negative Carnett sign.   Rectal:  Deferred.  Msk:  Symmetrical without gross deformities.  Good, equal movement & strength bilaterally. Pulses:  Normal pulses noted. Extremities:  No clubbing or edema.  Positive cyanosis. Neurologic:  Alert and oriented x3;  grossly normal neurologically. Skin:  Intact without significant lesions or rashes.  No jaundice. Lymph Nodes:  No significant cervical adenopathy. Psych:  Alert and cooperative. Normal mood and affect.  Imaging Studies: No results found.  Assessment and Plan:   Dean Garcia is a  82 y.o. y/o male who comes in today with a history of increased abdominal sounds with diarrhea and stool incontinence. The patient has been told to avoid dairy products for one week and to stop eating a diet high in fruits for the time being. If the symptoms do not improve he should add fiber to his diet. If these do not helped his symptoms the patient has been told to contact my office. The patient has been explained the plan and agrees with it.   Note: This dictation was prepared with Dragon dictation along with smaller phrase technology. Any transcriptional errors that result from this process are unintentional.

## 2016-05-30 ENCOUNTER — Emergency Department
Admission: EM | Admit: 2016-05-30 | Discharge: 2016-05-30 | Disposition: A | Discharge status: Home / Self Care | Payer: Medicare Other | Attending: Emergency Medicine | Admitting: Emergency Medicine

## 2016-05-30 ENCOUNTER — Emergency Department: Payer: Medicare Other

## 2016-05-30 DIAGNOSIS — I10 Essential (primary) hypertension: Secondary | ICD-10-CM | POA: Insufficient documentation

## 2016-05-30 DIAGNOSIS — J90 Pleural effusion, not elsewhere classified: Secondary | ICD-10-CM | POA: Insufficient documentation

## 2016-05-30 DIAGNOSIS — Z87891 Personal history of nicotine dependence: Secondary | ICD-10-CM | POA: Diagnosis not present

## 2016-05-30 DIAGNOSIS — R002 Palpitations: Secondary | ICD-10-CM | POA: Diagnosis present

## 2016-05-30 DIAGNOSIS — Z79899 Other long term (current) drug therapy: Secondary | ICD-10-CM | POA: Diagnosis not present

## 2016-05-30 DIAGNOSIS — Z8551 Personal history of malignant neoplasm of bladder: Secondary | ICD-10-CM | POA: Diagnosis not present

## 2016-05-30 DIAGNOSIS — Z8546 Personal history of malignant neoplasm of prostate: Secondary | ICD-10-CM | POA: Diagnosis not present

## 2016-05-30 DIAGNOSIS — E119 Type 2 diabetes mellitus without complications: Secondary | ICD-10-CM | POA: Insufficient documentation

## 2016-05-30 LAB — CBC
HCT: 37.8 % — ABNORMAL LOW (ref 40.0–52.0)
Hemoglobin: 13.1 g/dL (ref 13.0–18.0)
MCH: 33.1 pg (ref 26.0–34.0)
MCHC: 34.6 g/dL (ref 32.0–36.0)
MCV: 95.5 fL (ref 80.0–100.0)
PLATELETS: 184 10*3/uL (ref 150–440)
RBC: 3.96 MIL/uL — AB (ref 4.40–5.90)
RDW: 15.4 % — AB (ref 11.5–14.5)
WBC: 8.5 10*3/uL (ref 3.8–10.6)

## 2016-05-30 LAB — BASIC METABOLIC PANEL
Anion gap: 10 (ref 5–15)
BUN: 65 mg/dL — AB (ref 6–20)
CALCIUM: 8.7 mg/dL — AB (ref 8.9–10.3)
CO2: 30 mmol/L (ref 22–32)
CREATININE: 2.73 mg/dL — AB (ref 0.61–1.24)
Chloride: 97 mmol/L — ABNORMAL LOW (ref 101–111)
GFR, EST AFRICAN AMERICAN: 23 mL/min — AB (ref >60)
GFR, EST NON AFRICAN AMERICAN: 20 mL/min — AB (ref >60)
Glucose, Bld: 104 mg/dL — ABNORMAL HIGH (ref 65–99)
Potassium: 3.6 mmol/L (ref 3.5–5.1)
SODIUM: 137 mmol/L (ref 135–145)

## 2016-05-30 LAB — TROPONIN I: Troponin I: <0.03 ng/mL (ref <0.03)

## 2016-05-30 NOTE — ED Provider Notes (Signed)
[facility] Emergency Department Provider Note   ____________________________________________  Time seen: Approximately 5:36 AM  I have reviewed the triage vital signs and the nursing notes.   HISTORY  Chief Complaint Palpitations    HPI Dean Garcia is a 82 y.o. male who presents to the ED from home via EMS with a chief complaint of palpitations. Patient has a history of atrial fibrillation on Eloquis. Also states he has oxygen at home to use when needed. Reports sleeping on his stomach and he could "hear every fourth heartbeat missing". Denies rapid heart rate. Denies associated diaphoresis, chest pain, shortness of breath, dizziness, nausea, vomiting. Denies recent fever, chills, abdominal pain, bloody stools, hematuria, dysuria. Denies recent travel or trauma.Nothing makes his symptoms better or worse. Denies excessive caffeine use; only drinks one cup of black coffee each morning.   Past Medical History  Diagnosis Date  . High cholesterol   . Diabetes ([place])   . Hypertension   . Gout   . Tremor   . Bundle branch block, right   . Mitral valve prolapse   . CVA (cerebral infarction)   . Carotid stenosis   . Bladder cancer ([place])   . Prostate cancer ([place])   . Colon polyp     Patient Active Problem List   Diagnosis Date Noted  . Paroxysmal atrial fibrillation ([place]) 02/21/2016  . A-fib ([place]) 02/12/2016  . Centrilobular emphysema ([place]) 12/27/2015  . B12 deficiency 04/12/2015  . Atherosclerosis of aorta ([place]) 10/23/2014  . 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 Has a tremor 07/17/2014  . Type 2 diabetes mellitus ([place]) 07/17/2014  . Edema 07/17/2014  . Billowing mitral valve 07/12/2014  . BP (high blood pressure) 07/12/2014  . Carotid artery narrowing 07/12/2014  . Impaired renal function 06/07/2014  . Proctitis, radiation 06/07/2014  . Malignant neoplasm of prostate ([place]) 06/07/2014  . 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 Arthritis,  degenerative 06/07/2014  . Disorder of peripheral nervous system ([place]) 06/07/2014  . Gout 06/07/2014  . 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 DD (diverticular disease) 06/07/2014  . Disease of rectum 06/07/2014  . Diabetes mellitus ([place]) 06/07/2014  . Malignant neoplasm of urinary bladder ([place]) 06/07/2014    Past Surgical History  Procedure Laterality Date  . Appendectomy      Current Outpatient Rx  Name  Route  Sig  Dispense  Refill  . allopurinol (ZYLOPRIM) 100 MG tablet   Oral   Take 100 mg by mouth daily.         Marland Kitchen amLODipine (NORVASC) 5 MG tablet   Oral   Take 5 mg by mouth daily.         Marland Kitchen apixaban (ELIQUIS) 2.5 MG TABS tablet   Oral   Take 2.5 mg by mouth 2 (two) times daily.          Marland Kitchen atorvastatin (LIPITOR) 40 MG tablet   Oral   Take 40 mg by mouth at bedtime.         . cyanocobalamin (,VITAMIN B-12,) 1000 MCG/ML injection   Intramuscular   Inject 1,000 mcg into the muscle every 30 (thirty) days.          Marland Kitchen diltiazem (CARDIZEM CD) 240 MG 24 hr capsule   Oral   Take 1 capsule (240 mg total) by mouth daily.   30 capsule   0   . fexofenadine (ALLEGRA) 180 MG tablet   Oral   Take 180 mg by mouth as needed for allergies  or rhinitis.         Marland Kitchen glucose blood (ACCU-CHEK COMPACT PLUS) test strip               . insulin lispro protamine-lispro (HUMALOG 75/25 MIX) (75-25) 100 UNIT/ML SUSP injection   Subcutaneous   Inject 10 Units into the skin daily before supper.         . Insulin Pen Needle (B-D ULTRAFINE III SHORT PEN) 31G X 8 MM MISC               . losartan (COZAAR) 50 MG tablet   Oral   Take 50 mg by mouth daily.         . metolazone (ZAROXOLYN) 2.5 MG tablet   Oral   Take 2.5 mg by mouth every other day.          Marland Kitchen omeprazole (PRILOSEC) 20 MG capsule   Oral   Take 20 mg by mouth daily.          . ondansetron (ZOFRAN-ODT) 4 MG disintegrating tablet   Oral   Take 4 mg by mouth as needed.           . primidone (MYSOLINE) 50 MG tablet   Oral   Take 50 mg by mouth 2 (two) times daily.          . propranolol (INDERAL) 20 MG tablet   Oral   Take 20 mg by mouth 2 (two) times daily.         . ranitidine (ZANTAC) 150 MG tablet   Oral   Take 300 mg by mouth every evening.          . sitaGLIPtin (JANUVIA) 100 MG tablet   Oral   Take 100 mg by mouth daily.         Marland Kitchen torsemide (DEMADEX) 20 MG tablet   Oral   Take 40 mg by mouth daily.           Allergies Demerol  Family History  Problem Relation Age of Onset  . Hypertension Father   . Stroke Father   . Diabetes Mother     Social History Social History  Substance Use Topics  . Smoking status: Former Smoker    Types: Pipe    Quit date: 10/29/2010  . Smokeless tobacco: Never Used  . Alcohol Use: Yes    Review of Systems  Constitutional: No fever/chills. Eyes: No visual changes. ENT: No sore throat. Cardiovascular: Positive for palpitations. Denies chest pain. Respiratory: Denies shortness of breath. Gastrointestinal: No abdominal pain.  No nausea, no vomiting.  No diarrhea.  No constipation. Genitourinary: Negative for dysuria. Musculoskeletal: Negative for back pain. Skin: Negative for rash. Neurological: Negative for headaches, focal weakness or numbness.  10-point ROS otherwise negative.  ____________________________________________   PHYSICAL EXAM:  VITAL SIGNS: ED Triage Vitals  Enc Vitals Group     BP 05/30/16 0423 157/75 mmHg     Pulse Rate 05/30/16 0423 66     Resp 05/30/16 0423 22     Temp 05/30/16 0423 97.8 F (36.6 C)     Temp Source 05/30/16 0423 Oral     SpO2 05/30/16 0423 97 %     Weight --      Height --      Head Cir --      Peak Flow --      Pain Score 05/30/16 0423 0     Pain Loc --      Pain Edu? --  Excl. in [place]? --     Constitutional: Alert and oriented. Well appearing and in no acute distress. Eyes: Conjunctivae are normal. PERRL. EOMI. Head:  Atraumatic. Nose: No congestion/rhinnorhea. Mouth/Throat: Mucous membranes are moist.  Oropharynx non-erythematous. Neck: No stridor.  No carotid bruits. Cardiovascular: Normal rate, regular rhythm. Grossly normal heart sounds.  Good peripheral circulation. Respiratory: Normal respiratory effort.  No retractions. Lungs CTAB. Gastrointestinal: Soft and nontender. No distention. No abdominal bruits. No CVA tenderness. Musculoskeletal: No lower extremity tenderness nor edema.  No joint effusions. Neurologic:  Normal speech and language. No gross focal neurologic deficits are appreciated. No gait instability. Skin:  Skin is warm, dry and intact. No rash noted. Psychiatric: Mood and affect are normal. Speech and behavior are normal.  ____________________________________________   LABS (all labs ordered are listed, but only abnormal results are displayed)  Labs Reviewed  BASIC METABOLIC PANEL - Abnormal; Notable for the following:    Chloride 97 (*)    Glucose, Bld 104 (*)    BUN 65 (*)    Creatinine, Ser 2.73 (*)    Calcium 8.7 (*)    GFR calc non Af Amer 20 (*)    GFR calc Af Amer 23 (*)    All other components within normal limits  CBC - Abnormal; Notable for the following:    RBC 3.96 (*)    HCT 37.8 (*)    RDW 15.4 (*)    All other components within normal limits  TROPONIN I  TROPONIN I   ____________________________________________  EKG  ED ECG REPORT I, SUNG,JADE J, the attending physician, personally viewed and interpreted this ECG.   Date: 05/30/2016  EKG Time: 0405  Rate: 61  Rhythm: Junctional  Axis: Normal  Intervals:right bundle branch block  ST&T Change: Nonspecific No significant change from 02/2016  ____________________________________________  RADIOLOGY  Chest 2 view (viewed by me, interpreted per Dr. Alroy Dust): Small left pleural effusion, new. Chronic granulomatous changes again  evident. ____________________________________________   PROCEDURES  Procedure(s) performed: None  Critical Care performed: No  ____________________________________________   INITIAL IMPRESSION / ASSESSMENT AND PLAN / ED COURSE  Pertinent labs & imaging results that were available during my care of the patient were reviewed by me and considered in my medical decision making (see chart for details).  82 year old male with a history of atrial fibrillation who presents with palpitations; currently resolved. Laboratory and imaging results demonstrate slightly worsened renal insufficiency and small left pleural effusion. Room air saturations 95-97%. Patient will follow-up with Dr. Ubaldo Glassing who is his cardiologist this week. Strict return precautions given. Patient and spouse verbalize understanding and agree with plan of care. ____________________________________________   FINAL CLINICAL IMPRESSION(S) / ED DIAGNOSES  Final diagnoses:  Palpitation  Pleural effusion      NEW MEDICATIONS STARTED DURING THIS VISIT:  New Prescriptions   No medications on file     Note:  This document was prepared using Dragon voice recognition software and may include unintentional dictation errors.    Paulette Blanch, MD 05/30/16 [phone]

## 2016-05-30 NOTE — Discharge Instructions (Signed)
Please follow up with your cardiologist in the next 2-3 days. Return to the ER for worsening symptoms, chest pain, difficulty breathing or other concerns.  Palpitations A palpitation is the feeling that your heartbeat is irregular or is faster than normal. It may feel like your heart is fluttering or skipping a beat. Palpitations are usually not a serious problem. However, in some cases, you may need further medical evaluation. CAUSES  Palpitations can be caused by:  Smoking.  Caffeine or other stimulants, such as diet pills or energy drinks.  Alcohol.  Stress and anxiety.  Strenuous physical activity.  Fatigue.  Certain medicines.  Heart disease, especially if you have a history of irregular heart rhythms (arrhythmias), such as atrial fibrillation, atrial flutter, or supraventricular tachycardia.  An improperly working pacemaker or defibrillator. DIAGNOSIS  To find the cause of your palpitations, your health care provider will take your medical history and perform a physical exam. Your health care provider may also have you take a test called an ambulatory electrocardiogram (ECG). An ECG records your heartbeat patterns over a 24-hour period. You may also have other tests, such as:  Transthoracic echocardiogram (TTE). During echocardiography, sound waves are used to evaluate how blood flows through your heart.  Transesophageal echocardiogram (TEE).  Cardiac monitoring. This allows your health care provider to monitor your heart rate and rhythm in real time.  Holter monitor. This is a portable device that records your heartbeat and can help diagnose heart arrhythmias. It allows your health care provider to track your heart activity for several days, if needed.  Stress tests by exercise or by giving medicine that makes the heart beat faster. TREATMENT  Treatment of palpitations depends on the cause of your symptoms and can vary greatly. Most cases of palpitations do not require any  treatment other than time, relaxation, and monitoring your symptoms. Other causes, such as atrial fibrillation, atrial flutter, or supraventricular tachycardia, usually require further treatment. HOME CARE INSTRUCTIONS   Avoid:  Caffeinated coffee, tea, soft drinks, diet pills, and energy drinks.  Chocolate.  Alcohol.  Stop smoking if you smoke.  Reduce your stress and anxiety. Things that can help you relax include:  A method of controlling things in your body, such as your heartbeats, with your mind (biofeedback).  Yoga.  Meditation.  Physical activity such as swimming, jogging, or walking.  Get plenty of rest and sleep. SEEK MEDICAL CARE IF:   You continue to have a fast or irregular heartbeat beyond 24 hours.  Your palpitations occur more often. SEEK IMMEDIATE MEDICAL CARE IF:  You have chest pain or shortness of breath.  You have a severe headache.  You feel dizzy or you faint. MAKE SURE YOU:  Understand these instructions.  Will watch your condition.  Will get help right away if you are not doing well or get worse.   This information is not intended to replace advice given to you by your health care provider. Make sure you discuss any questions you have with your health care provider.   Document Released: 11/16/2000 Document Revised: 11/24/2013 Document Reviewed: 01/18/2012 Elsevier Interactive Patient Education 2016 Elsevier Inc.  Pleural Effusion A pleural effusion is an abnormal buildup of fluid in the layers of tissue between your lungs and the inside of your chest (pleural space). These two layers of tissue that line both your lungs and the inside of your chest are called pleura. Usually, there is no air in the space between the pleura, only a thin layer of  fluid. If left untreated, a large amount of fluid can build up and cause the lung to collapse. A pleural effusion is usually caused by another disease that requires treatment. The two main types of  pleural effusion are:  Transudative pleural effusion. This happens when fluid leaks into the pleural space because of a low protein count in your blood or high blood pressure in your vessels. Heart failure often causes this.  Exudative infusion. This occurs when fluid collects in the pleural space from blocked blood vessels or lymph vessels. Some lung diseases, injuries, and cancers can cause this type of effusion. CAUSES Pleural effusion can be caused by:  Heart failure.  A blood clot in the lung (pulmonary embolism).  Pneumonia.  Cancer.  Liver failure (cirrhosis).  Kidney disease.  Complications from surgery, such as from open heart surgery. SIGNS AND SYMPTOMS In some cases, pleural effusion may cause no symptoms. Symptoms can include:  Shortness of breath, especially when lying down.  Chest pain, often worse when taking a deep breath.  Fever.  Dry cough that is lasting (chronic).  Hiccups.  Rapid breathing. An underlying condition that is causing the pleural effusion (such as heart failure, pneumonia, blood clots, tuberculosis, or cancer) may also cause additional symptoms. DIAGNOSIS Your health care provider may suspect pleural effusion based on your symptoms and medical history. Your health care provider will also do a physical exam and a chest X-ray. If the X-ray shows there is fluid in your chest, you may need to have this fluid removed using a needle (thoracentesis) so it can be tested. You may also have:  Imaging studies of the chest, such as:  Ultrasound.  CT scan.  Blood tests for kidney and liver function. TREATMENT Treatment depends on the cause of the pleural effusion. Treatment may include:  Taking antibiotic medicines to clear up an infection that is causing the pleural effusion.  Placing a tube in the chest to drain the effusion (tube thoracostomy). This procedure is often used when there is an infection in the fluid.  Surgery to remove the  fibrous outer layer of tissue from the pleural space (decortication).  Thoracentesis, which can improve cough and shortness of breath.  A procedure to put medicine into the chest cavity to seal the pleural space to prevent fluid buildup (pleurodesis).  Chemotherapy and radiation therapy. These may be required in the case of cancerous (malignant) pleural effusion. HOME CARE INSTRUCTIONS  Take medicines only as directed by your health care provider.  Keep track of how long you can gently exercise before you get short of breath. Try simply walking at first.  Do not use any tobacco products, including cigarettes, chewing tobacco, or electronic cigarettes. If you need help quitting, ask your health care provider.  Keep all follow-up visits as directed by your health care provider. This is important. SEEK MEDICAL CARE IF:  The amount of time that you are able to exercise decreases or does not improve with time.  You have pain or signs of infection at the puncture site if you had thoracentesis. Watch for:  Drainage.  Redness.  Swelling.  You have a fever. SEEK IMMEDIATE MEDICAL CARE IF:  You are short of breath.  You develop chest pain.  You develop a new cough. MAKE SURE YOU:  Understand these instructions.  Will watch your condition.  Will get help right away if you are not doing well or get worse.   This information is not intended to replace advice given to  you by your health care provider. Make sure you discuss any questions you have with your health care provider.   Document Released: 11/19/2005 Document Revised: 12/10/2014 Document Reviewed: 04/14/2014 Elsevier Interactive Patient Education [employer].

## 2016-05-30 NOTE — ED Notes (Signed)
Pt presents to ED via [facility] from home. Pt woke up and was laying in bed and stated he felt his heart was skipping a beat, but states he only felt it lying down. Pt denies pain only arrival. Pt with hx of a fib, HTN, diabetes. VS with EMS 160/64, 95% on RA.   Wife is Saifullah Martello- phone number is [phone].

## 2016-05-30 NOTE — ED Notes (Signed)
Patient transported to X-ray 

## 2016-10-22 ENCOUNTER — Emergency Department
Admission: EM | Admit: 2016-10-22 | Discharge: 2016-10-22 | Disposition: A | Discharge status: Home / Self Care | Payer: Medicare Other | Attending: Emergency Medicine | Admitting: Emergency Medicine

## 2016-10-22 ENCOUNTER — Emergency Department: Payer: Medicare Other

## 2016-10-22 ENCOUNTER — Encounter: Payer: Self-pay | Admitting: Emergency Medicine

## 2016-10-22 DIAGNOSIS — Z79899 Other long term (current) drug therapy: Secondary | ICD-10-CM | POA: Insufficient documentation

## 2016-10-22 DIAGNOSIS — F321 Major depressive disorder, single episode, moderate: Secondary | ICD-10-CM | POA: Diagnosis not present

## 2016-10-22 DIAGNOSIS — F329 Major depressive disorder, single episode, unspecified: Secondary | ICD-10-CM | POA: Insufficient documentation

## 2016-10-22 DIAGNOSIS — F0390 Unspecified dementia without behavioral disturbance: Secondary | ICD-10-CM

## 2016-10-22 DIAGNOSIS — F03A0 Unspecified dementia, mild, without behavioral disturbance, psychotic disturbance, mood disturbance, and anxiety: Secondary | ICD-10-CM

## 2016-10-22 DIAGNOSIS — Z8546 Personal history of malignant neoplasm of prostate: Secondary | ICD-10-CM | POA: Diagnosis not present

## 2016-10-22 DIAGNOSIS — F320 Major depressive disorder, single episode, mild: Secondary | ICD-10-CM

## 2016-10-22 DIAGNOSIS — Z794 Long term (current) use of insulin: Secondary | ICD-10-CM | POA: Insufficient documentation

## 2016-10-22 DIAGNOSIS — Z8551 Personal history of malignant neoplasm of bladder: Secondary | ICD-10-CM | POA: Insufficient documentation

## 2016-10-22 DIAGNOSIS — I10 Essential (primary) hypertension: Secondary | ICD-10-CM | POA: Diagnosis not present

## 2016-10-22 DIAGNOSIS — R531 Weakness: Secondary | ICD-10-CM

## 2016-10-22 DIAGNOSIS — R6889 Other general symptoms and signs: Secondary | ICD-10-CM

## 2016-10-22 DIAGNOSIS — R4589 Other symptoms and signs involving emotional state: Secondary | ICD-10-CM

## 2016-10-22 DIAGNOSIS — Z87891 Personal history of nicotine dependence: Secondary | ICD-10-CM | POA: Diagnosis not present

## 2016-10-22 DIAGNOSIS — E119 Type 2 diabetes mellitus without complications: Secondary | ICD-10-CM | POA: Diagnosis not present

## 2016-10-22 LAB — ETHANOL: Alcohol, Ethyl (B): <5 mg/dL (ref <5)

## 2016-10-22 LAB — COMPREHENSIVE METABOLIC PANEL
ALBUMIN: 3.5 g/dL (ref 3.5–5.0)
ALT: 12 U/L — ABNORMAL LOW (ref 17–63)
ANION GAP: 11 (ref 5–15)
AST: 22 U/L (ref 15–41)
Alkaline Phosphatase: 57 U/L (ref 38–126)
BILIRUBIN TOTAL: 0.6 mg/dL (ref 0.3–1.2)
BUN: 69 mg/dL — ABNORMAL HIGH (ref 6–20)
CHLORIDE: 94 mmol/L — AB (ref 101–111)
CO2: 31 mmol/L (ref 22–32)
Calcium: 8.4 mg/dL — ABNORMAL LOW (ref 8.9–10.3)
Creatinine, Ser: 3 mg/dL — ABNORMAL HIGH (ref 0.61–1.24)
GFR calc Af Amer: 20 mL/min — ABNORMAL LOW (ref >60)
GFR calc non Af Amer: 17 mL/min — ABNORMAL LOW (ref >60)
GLUCOSE: 143 mg/dL — AB (ref 65–99)
POTASSIUM: 3.5 mmol/L (ref 3.5–5.1)
SODIUM: 136 mmol/L (ref 135–145)
TOTAL PROTEIN: 6.9 g/dL (ref 6.5–8.1)

## 2016-10-22 LAB — CBC
HCT: 39.3 % — ABNORMAL LOW (ref 40.0–52.0)
Hemoglobin: 13.5 g/dL (ref 13.0–18.0)
MCH: 32 pg (ref 26.0–34.0)
MCHC: 34.3 g/dL (ref 32.0–36.0)
MCV: 93.4 fL (ref 80.0–100.0)
PLATELETS: 215 10*3/uL (ref 150–440)
RBC: 4.21 MIL/uL — ABNORMAL LOW (ref 4.40–5.90)
RDW: 14.9 % — AB (ref 11.5–14.5)
WBC: 7.9 10*3/uL (ref 3.8–10.6)

## 2016-10-22 LAB — ACETAMINOPHEN LEVEL

## 2016-10-22 LAB — TROPONIN I
TROPONIN I: 0.03 ng/mL — AB (ref <0.03)
Troponin I: 0.03 ng/mL (ref <0.03)

## 2016-10-22 LAB — SALICYLATE LEVEL

## 2016-10-22 MED ORDER — METOLAZONE 2.5 MG PO TABS
2.5000 mg | ORAL_TABLET | ORAL | Status: DC
  Administered 2016-10-22: 2.5 mg via ORAL
  Filled 2016-10-22: qty 1

## 2016-10-22 MED ORDER — APIXABAN 2.5 MG PO TABS
2.5000 mg | ORAL_TABLET | Freq: Two times a day (BID) | ORAL | Status: DC
  Administered 2016-10-22: 2.5 mg via ORAL
  Filled 2016-10-22 (×2): qty 1

## 2016-10-22 MED ORDER — FAMOTIDINE 20 MG PO TABS
10.0000 mg | ORAL_TABLET | Freq: Every day | ORAL | Status: DC
  Administered 2016-10-22: 10 mg via ORAL
  Filled 2016-10-22: qty 1

## 2016-10-22 MED ORDER — PANTOPRAZOLE SODIUM 40 MG PO TBEC
40.0000 mg | DELAYED_RELEASE_TABLET | Freq: Every day | ORAL | Status: DC
  Administered 2016-10-22: 40 mg via ORAL
  Filled 2016-10-22: qty 1

## 2016-10-22 MED ORDER — ATORVASTATIN CALCIUM 20 MG PO TABS: 40.0000 mg | ORAL_TABLET | Freq: Every day | ORAL | Status: DC

## 2016-10-22 MED ORDER — LINAGLIPTIN 5 MG PO TABS
5.0000 mg | ORAL_TABLET | Freq: Every day | ORAL | Status: DC
  Administered 2016-10-22: 5 mg via ORAL
  Filled 2016-10-22: qty 1

## 2016-10-22 MED ORDER — CYANOCOBALAMIN 1000 MCG/ML IJ SOLN: 1000.0000 ug | INTRAMUSCULAR | Status: DC

## 2016-10-22 MED ORDER — ALLOPURINOL 100 MG PO TABS
100.0000 mg | ORAL_TABLET | Freq: Every day | ORAL | Status: DC
  Administered 2016-10-22: 100 mg via ORAL
  Filled 2016-10-22: qty 1

## 2016-10-22 MED ORDER — PRIMIDONE 50 MG PO TABS
50.0000 mg | ORAL_TABLET | Freq: Two times a day (BID) | ORAL | Status: DC
  Administered 2016-10-22: 50 mg via ORAL
  Filled 2016-10-22 (×2): qty 1

## 2016-10-22 MED ORDER — MIRTAZAPINE 15 MG PO TABS: 15.0000 mg | ORAL_TABLET | Freq: Every day | ORAL | 0 refills | Status: DC

## 2016-10-22 MED ORDER — ONDANSETRON 4 MG PO TBDP: 4.0000 mg | ORAL_TABLET | Freq: Three times a day (TID) | ORAL | Status: DC | PRN

## 2016-10-22 MED ORDER — GLUCOSE BLOOD VI STRP
1.0000 | ORAL_STRIP | Status: DC | PRN
  Filled 2016-10-22: qty 1

## 2016-10-22 MED ORDER — DILTIAZEM HCL ER COATED BEADS 240 MG PO CP24
240.0000 mg | ORAL_CAPSULE | Freq: Every day | ORAL | Status: DC
  Administered 2016-10-22: 240 mg via ORAL
  Filled 2016-10-22: qty 1

## 2016-10-22 MED ORDER — PROPRANOLOL HCL 10 MG PO TABS
10.0000 mg | ORAL_TABLET | Freq: Two times a day (BID) | ORAL | Status: DC
  Administered 2016-10-22: 10 mg via ORAL
  Filled 2016-10-22 (×2): qty 1

## 2016-10-22 MED ORDER — AMLODIPINE BESYLATE 5 MG PO TABS
5.0000 mg | ORAL_TABLET | Freq: Every day | ORAL | Status: DC
  Administered 2016-10-22: 5 mg via ORAL
  Filled 2016-10-22: qty 1

## 2016-10-22 MED ORDER — LOSARTAN POTASSIUM 50 MG PO TABS
50.0000 mg | ORAL_TABLET | Freq: Every day | ORAL | Status: DC
  Administered 2016-10-22: 50 mg via ORAL
  Filled 2016-10-22: qty 1

## 2016-10-22 MED ORDER — TORSEMIDE 20 MG PO TABS
40.0000 mg | ORAL_TABLET | Freq: Every day | ORAL | Status: DC
  Administered 2016-10-22: 40 mg via ORAL
  Filled 2016-10-22: qty 2

## 2016-10-22 NOTE — Consult Note (Signed)
[facility] Consult   Reason for Consult:  Consult for 82 year old man with acute anxiety Referring Physician:  Cinda Quest Patient Identification: Dean Garcia MRN:  [id number] Principal Diagnosis: Moderate major depression, single episode [facility]) Diagnosis:   Patient Active Problem List   Diagnosis Date Noted  . Moderate major depression, single episode ([place]) [F32.1] 10/22/2016  . Mild dementia [F03.90] 10/22/2016  . Paroxysmal atrial fibrillation ([place]) [I48.0] 02/21/2016  . A-fib ([place]) [I48.91] 02/12/2016  . Centrilobular emphysema ([place]) [J43.2] 12/27/2015  . B12 deficiency [E53.8] 04/12/2015  . Atherosclerosis of aorta ([place]) [I70.0] 10/23/2014  . 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 Has a tremor [R25.1] 07/17/2014  . Type 2 diabetes mellitus ([place]) [E11.9] 07/17/2014  . Edema [R60.9] 07/17/2014  . Billowing mitral valve [I34.1] 07/12/2014  . BP (high blood pressure) [I10] 07/12/2014  . Carotid artery narrowing [I65.29] 07/12/2014  . Impaired renal function [N28.9] 06/07/2014  . 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 Malignant neoplasm of prostate ([place]) [C61] 06/07/2014  . 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 Arthritis, degenerative [M19.90] 06/07/2014  . Disorder of peripheral nervous system ([place]) [G64] 06/07/2014  . Gout [M10.9] 06/07/2014  . 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 DD (diverticular disease) [K57.90] 06/07/2014  . Disease of rectum [K62.9] 06/07/2014  . Diabetes mellitus ([place]) [E11.9] 06/07/2014  . Malignant neoplasm of urinary bladder ([facility]) [C67.9] 06/07/2014    Total Time spent with patient: 1 hour  Subjective:   Dean Garcia is a 82 y.o. male patient admitted with "I'm doing poorly".  HPI:  Patient interviewed. Chart reviewed. Spoke with the patient's wife as well. Patient came into the emergency room because he woke up last night having a feeling "like I was going to explode". He has a hard  time describing it any better than that but it sounds like it was a panicky sort of feeling. Brought him into the emergency room this morning. Prior to that the patient does report that his chief complaint is difficulty sleeping relating to pain in his feet. He says he gets cramping feeling in his feet every night when he goes to bed. This wakes him up frequently at night. He gets up and moves around the house for a few hours before he goes back to sleep. This is probably been going on for months and getting more disturbing to him. Patient denies feeling depressed but does admit that his mood gets down although he continues to blame it on his physical symptoms. He denies having any suicidal ideation. He denies any psychosis. Other major stresses include financial problems  Social history: Patient is retired. HEENT his wife live together. He has some adult children and at least one of whom has some chronic behavior problems. They recently had some financial difficulties but it sounds like it's all been sorted out and is not extreme.  Medical history: Patient has chronic respiratory difficulty and uses oxygen at night. Multiple other medical problems including diabetes  Substance abuse history: Patient says he drinks every day. Has a couple of cocktails in the evening and prosody some wine with dinner as well. Wife says she's been trying to get him to cut down on his drinking for a while. No history of any treatment for it or identification of it as a problem  Past Psychiatric History: Patient has talked to his primary care doctor before. It sounds like one or 2 things have been tried for sleep. There is nothing on his current medicine list however  that clearly stands out as a sleeper psychiatric medicine. No history of psychiatric hospitalization no history of suicidal behavior. Wife says she is not concerned that he has a high risk of trying to hurt himself.  Risk to Self: Suicidal Ideation: Yes-Currently  Present Suicidal Intent: No Is patient at risk for suicide?: No Suicidal Plan?: No Access to Means: No What has been your use of drugs/alcohol within the last 12 months?: Cocktails with dinner How many times?: 0 Other Self Harm Risks: denied Triggers for Past Attempts: None known Intentional Self Injurious Behavior: None Risk to Others: Homicidal Ideation: No Thoughts of Harm to Others: No Current Homicidal Intent: No Current Homicidal Plan: No Access to Homicidal Means: No Identified Victim: None History of harm to others?: No Assessment of Violence: None Noted Violent Behavior Description: denied Does patient have access to weapons?: No Criminal Charges Pending?: No Does patient have a court date: No Prior Inpatient Therapy: Prior Inpatient Therapy: No Prior Therapy Dates: n/a Prior Therapy Facilty/Provider(s): n/a Reason for Treatment: n/a Prior Outpatient Therapy: Prior Outpatient Therapy: No Prior Therapy Dates: n/a Prior Therapy Facilty/Provider(s): n/a Reason for Treatment: n/a Does patient have an ACCT team?: No Does patient have Intensive In-House Services?  : No Does patient have [facility] services? : No Does patient have P4CC services?: No  Past Medical History:  Past Medical History:  Diagnosis Date  . Bladder cancer ([place])   . Bundle branch block, right   . Carotid stenosis   . Colon polyp   . CVA (cerebral infarction)   . Diabetes ([place])   . Gout   . High cholesterol   . Hypertension   . Mitral valve prolapse   . Prostate cancer ([place])   . Tremor     Past Surgical History:  Procedure Laterality Date  . APPENDECTOMY     Family History:  Family History  Problem Relation Age of Onset  . Hypertension Father   . Stroke Father   . Diabetes Mother    Family Psychiatric  History: Daughter has alcohol abuse problems Social History:  History  Alcohol Use  . Yes     History  Drug Use No    Social History   Social History  . Marital status:  Married    Spouse name: N/A  . Number of children: N/A  . Years of education: N/A   Social History Main Topics  . Smoking status: Former Smoker    Types: Pipe    Quit date: 10/29/2010  . Smokeless tobacco: Never Used  . Alcohol use Yes  . Drug use: No  . Sexual activity: Not Asked   Other Topics Concern  . None   Social History Narrative  . None   Additional Social History:    Allergies:   Allergies  Allergen Reactions  . Demerol [Meperidine] Nausea And Vomiting    Labs:  Results for orders placed or performed during the hospital encounter of 10/22/16 (from the past 48 hour(s))  CBC     Status: Abnormal   Collection Time: 10/22/16  2:48 AM  Result Value Ref Range   WBC 7.9 3.8 - 10.6 K/uL   RBC 4.21 (L) 4.40 - 5.90 MIL/uL   Hemoglobin 13.5 13.0 - 18.0 g/dL   HCT 39.3 (L) 40.0 - 52.0 %   MCV 93.4 80.0 - 100.0 fL   MCH 32.0 26.0 - 34.0 pg   MCHC 34.3 32.0 - 36.0 g/dL   RDW 14.9 (H) 11.5 - 14.5 %  Platelets 215 150 - 440 K/uL  Comprehensive metabolic panel     Status: Abnormal   Collection Time: 10/22/16  2:48 AM  Result Value Ref Range   Sodium 136 135 - 145 mmol/L   Potassium 3.5 3.5 - 5.1 mmol/L   Chloride 94 (L) 101 - 111 mmol/L   CO2 31 22 - 32 mmol/L   Glucose, Bld 143 (H) 65 - 99 mg/dL   BUN 69 (H) 6 - 20 mg/dL   Creatinine, Ser 3.00 (H) 0.61 - 1.24 mg/dL   Calcium 8.4 (L) 8.9 - 10.3 mg/dL   Total Protein 6.9 6.5 - 8.1 g/dL   Albumin 3.5 3.5 - 5.0 g/dL   AST 22 15 - 41 U/L   ALT 12 (L) 17 - 63 U/L   Alkaline Phosphatase 57 38 - 126 U/L   Total Bilirubin 0.6 0.3 - 1.2 mg/dL   GFR calc non Af Amer 17 (L) >60 mL/min   GFR calc Af Amer 20 (L) >60 mL/min    Comment: (NOTE) The eGFR has been calculated using the CKD EPI equation. This calculation has not been validated in all clinical situations. eGFR's persistently <60 mL/min signify possible Chronic Kidney Disease.    Anion gap 11 5 - 15  Troponin I     Status: Abnormal   Collection Time:  10/22/16  2:48 AM  Result Value Ref Range   Troponin I 0.03 (HH) <0.03 ng/mL    Comment: CRITICAL RESULT CALLED TO, READ BACK BY AND VERIFIED WITH IRIS GUIDRY AT 0330 10/22/16.PMH  Ethanol     Status: None   Collection Time: 10/22/16  2:48 AM  Result Value Ref Range   Alcohol, Ethyl (B) <5 <5 mg/dL    Comment:        LOWEST DETECTABLE LIMIT FOR SERUM ALCOHOL IS 5 mg/dL FOR MEDICAL PURPOSES ONLY   Acetaminophen level     Status: Abnormal   Collection Time: 10/22/16  2:48 AM  Result Value Ref Range   Acetaminophen (Tylenol), Serum <10 (L) 10 - 30 ug/mL    Comment:        THERAPEUTIC CONCENTRATIONS VARY SIGNIFICANTLY. A RANGE OF 10-30 ug/mL MAY BE AN EFFECTIVE CONCENTRATION FOR MANY PATIENTS. HOWEVER, SOME ARE BEST TREATED AT CONCENTRATIONS OUTSIDE THIS RANGE. ACETAMINOPHEN CONCENTRATIONS >150 ug/mL AT 4 HOURS AFTER INGESTION AND >50 ug/mL AT 12 HOURS AFTER INGESTION ARE OFTEN ASSOCIATED WITH TOXIC REACTIONS.   Salicylate level     Status: None   Collection Time: 10/22/16  2:48 AM  Result Value Ref Range   Salicylate Lvl <1.8 2.8 - 30.0 mg/dL  Troponin I     Status: Abnormal   Collection Time: 10/22/16  6:12 AM  Result Value Ref Range   Troponin I 0.03 (HH) <0.03 ng/mL    Comment: CRITICAL VALUE NOTED. VALUE IS CONSISTENT WITH PREVIOUSLY REPORTED/CALLED VALUE QSD     Current Facility-Administered Medications  Medication Dose Route Frequency Provider Last Rate Last Dose  . allopurinol (ZYLOPRIM) tablet 100 mg  100 mg Oral Daily Nena Polio, MD   100 mg at 10/22/16 1236  . amLODipine (NORVASC) tablet 5 mg  5 mg Oral Daily Nena Polio, MD   5 mg at 10/22/16 1230  . apixaban (ELIQUIS) tablet 2.5 mg  2.5 mg Oral BID Nena Polio, MD   2.5 mg at 10/22/16 1236  . atorvastatin (LIPITOR) tablet 40 mg  40 mg Oral QHS Nena Polio, MD      . diltiazem (  CARDIZEM CD) 24 hr capsule 240 mg  240 mg Oral Daily Nena Polio, MD   240 mg at 10/22/16 1237  . famotidine  (PEPCID) tablet 10 mg  10 mg Oral Daily Nena Polio, MD   10 mg at 10/22/16 1230  . glucose blood test strip STRP 1 each  1 each Other PRN Nena Polio, MD      . linagliptin (TRADJENTA) tablet 5 mg  5 mg Oral Daily Nena Polio, MD   5 mg at 10/22/16 1230  . losartan (COZAAR) tablet 50 mg  50 mg Oral Daily Nena Polio, MD   50 mg at 10/22/16 1229  . metolazone (ZAROXOLYN) tablet 2.5 mg  2.5 mg Oral QODAY Nena Polio, MD   2.5 mg at 10/22/16 1236  . ondansetron (ZOFRAN-ODT) disintegrating tablet 4 mg  4 mg Oral Q8H PRN Nena Polio, MD      . pantoprazole (PROTONIX) EC tablet 40 mg  40 mg Oral Daily Nena Polio, MD   40 mg at 10/22/16 1229  . primidone (MYSOLINE) tablet 50 mg  50 mg Oral BID Nena Polio, MD   50 mg at 10/22/16 1235  . propranolol (INDERAL) tablet 10 mg  10 mg Oral BID Nena Polio, MD   10 mg at 10/22/16 1235  . torsemide (DEMADEX) tablet 40 mg  40 mg Oral Daily Nena Polio, MD   40 mg at 10/22/16 1234   Current Outpatient Prescriptions  Medication Sig Dispense Refill  . allopurinol (ZYLOPRIM) 100 MG tablet Take 100 mg by mouth daily.    Marland Kitchen amLODipine (NORVASC) 5 MG tablet Take 5 mg by mouth daily.    Marland Kitchen apixaban (ELIQUIS) 2.5 MG TABS tablet Take 2.5 mg by mouth 2 (two) times daily.     Marland Kitchen atorvastatin (LIPITOR) 40 MG tablet Take 40 mg by mouth at bedtime.    . cyanocobalamin (,VITAMIN B-12,) 1000 MCG/ML injection Inject 1,000 mcg into the muscle every 30 (thirty) days.     Marland Kitchen diltiazem (CARDIZEM CD) 240 MG 24 hr capsule Take 1 capsule (240 mg total) by mouth daily. 30 capsule 0  . fexofenadine (ALLEGRA) 180 MG tablet Take 180 mg by mouth as needed for allergies or rhinitis.    Marland Kitchen glucose blood (ACCU-CHEK COMPACT PLUS) test strip     . insulin lispro protamine-lispro (HUMALOG 75/25 MIX) (75-25) 100 UNIT/ML SUSP injection Inject 10 Units into the skin daily before supper.    . Insulin Pen Needle (B-D ULTRAFINE III SHORT PEN) 31G X 8 MM MISC     . losartan  (COZAAR) 50 MG tablet Take 50 mg by mouth daily.    . metolazone (ZAROXOLYN) 2.5 MG tablet Take 2.5 mg by mouth every other day.     Marland Kitchen omeprazole (PRILOSEC) 20 MG capsule Take 20 mg by mouth daily.     . ondansetron (ZOFRAN-ODT) 4 MG disintegrating tablet Take 4 mg by mouth every 8 (eight) hours as needed for nausea or vomiting.     . primidone (MYSOLINE) 50 MG tablet Take 50 mg by mouth 2 (two) times daily.     . propranolol (INDERAL) 10 MG tablet Take 10 mg by mouth 2 (two) times daily.     . ranitidine (ZANTAC) 150 MG tablet Take 300 mg by mouth every evening.     . sitaGLIPtin (JANUVIA) 25 MG tablet Take 25 mg by mouth daily.     Marland Kitchen torsemide (DEMADEX) 20 MG tablet  Take 40 mg by mouth daily.    . mirtazapine (REMERON) 15 MG tablet Take 1 tablet (15 mg total) by mouth at bedtime. 30 tablet 0    Musculoskeletal: Strength & Muscle Tone: within normal limits Gait & Station: unsteady Patient leans: N/A  Psychiatric Specialty Exam: Physical Exam  Nursing note and vitals reviewed. Constitutional: He appears well-developed and well-nourished.  HENT:  Head: Normocephalic and atraumatic.  Eyes: Conjunctivae are normal. Pupils are equal, round, and reactive to light.  Neck: Normal range of motion.  Cardiovascular: Normal rate.   Respiratory: Effort normal. No respiratory distress.  GI: Soft.  Musculoskeletal: Normal range of motion.  Neurological: He is alert.  Skin: Skin is warm and dry.  Psychiatric: Judgment normal. His mood appears anxious. His speech is delayed. He is slowed. Thought content is not paranoid. He exhibits a depressed mood. He expresses no homicidal and no suicidal ideation. He exhibits abnormal recent memory.    Review of Systems  Constitutional: Negative.   HENT: Negative.   Eyes: Negative.   Respiratory: Negative.   Cardiovascular: Negative.   Gastrointestinal: Negative.   Musculoskeletal: Negative.   Skin: Negative.   Neurological: Negative.    Psychiatric/Behavioral: Positive for depression. Negative for hallucinations, memory loss, substance abuse and suicidal ideas. The patient is nervous/anxious and has insomnia.     Blood pressure (!) 142/68, pulse (!) 46, temperature 98.4 F (36.9 C), temperature source Oral, resp. rate 13, height _0  (1.854 m), weight 74.8 kg (165 lb), SpO2 98 %.Body mass index is 21.77 kg/m.  General Appearance: Disheveled  Eye Contact:  Minimal  Speech:  Slow  Volume:  Normal  Mood:  Dysphoric  Affect:  Congruent  Thought Process:  Goal Directed  Orientation:  Full (Time, Place, and Person)  Thought Content:  Logical  Suicidal Thoughts:  No  Homicidal Thoughts:  No  Memory:  Immediate;   Good Recent;   Poor Remote;   Fair  Judgement:  Fair  Insight:  Fair  Psychomotor Activity:  Decreased  Concentration:  Concentration: Fair  Recall:  [employer] of Knowledge:  Fair  Language:  Fair  Akathisia:  No  Handed:  Right  AIMS (if indicated):     Assets:  Communication Skills Desire for Improvement Financial Resources/Insurance Housing Social Support  ADL's:  Intact  Cognition:  Impaired,  Mild  Sleep:        Treatment Plan Summary: Medication management and Plan This is an 82 year old man who appears to have a mild degree of dementia and multiple symptoms that would be characteristic of a major depression. There is no sign of psychotic symptoms. Apparently he has made a couple of calm and said about feeling like he wishes it were all over and that sort of thing but the wife doesn't think he has any risk of trying to kill himself. Patient absolutely denies suicidal ideation. I think he probably needs more treatment for possible depression. Also his sleep problems are the most acutely disturbing thing for him. Based on that I am giving him a prescription for mirtazapine 15 mg at night. Wife understands the plan. He is to follow-up with his primary care doctor for sleep and  depression  Disposition: Patient does not meet criteria for psychiatric inpatient admission. Supportive therapy provided about ongoing stressors.  Alethia Berthold, MD 10/22/2016 1:49 PM

## 2016-10-22 NOTE — ED Notes (Signed)
Dr.Clapacs at bedside  

## 2016-10-22 NOTE — ED Triage Notes (Signed)
EMS pt to Rm 20 H with report of waking up around 1 am with a feeling of impending doom and "feeling like I'm going to explode". Per EMS pt's wife also reports pt has hx of depression. Pt denies chest pain or shortness of breath at this time states he just feels like he is going to explode. Pt talking in full sentences with noted slight shortness of breath while talking.

## 2016-10-22 NOTE — ED Notes (Signed)
EDP notified of elevated troponin by Hal Neer, RN

## 2016-10-22 NOTE — ED Notes (Signed)
Pt resting at this time.  Wife went home.  Pt given ginger ale as well.

## 2016-10-22 NOTE — ED Provider Notes (Signed)
Patient seen by Dr. Lissa Hoard packs who also spoke with patient's wife. He will prescribe some medications specifically mirtazapine 15 mg 1 at bedtime patient will follow-up with his doctor return for any further problems.   Nena Polio, MD 10/22/16 1310

## 2016-10-22 NOTE — Discharge Instructions (Signed)
Please take your medicines as prescribed. Please follow-up with Dr. Frazier Richards call for an appointment in the next few days. Let them know that you were in the emergency room, they should see fairly rapidly. Please return here for any further problems.

## 2016-10-22 NOTE — BH Assessment (Signed)
Assessment Note  Dean Garcia is an 82 y.o. male. Dean Garcia arrived to the ED by way of EMS. He reports that he has "spasms" in his lower leg/foot.  He was having one and rolled over on his stomach to get it to go away, and it did.  He states that he then felt a compression in his chest and felt he would blow up.  He states that he then could not go back to sleep, and he told his wife that he wanted to go to the hospital.  He reports he has been feeling depressed.  He has been eating less, Sleeping more,( does not sleep long at night).  He denied symptoms of anxiety.  He states that he has been worrying about a Teacher, music", stating that "geek squad" has been out and they think the problem is resolved but they have to change all the passwords to everything) and health problems.  He denied auditory or visual hallucinations. Wife reports passive thoughts of suicide with no plan.  He denied homicidal ideation or intent. Wife reports a history of depression, earlier in the day he made statements of wishing that someone would give him a shot to end it all  Diagnosis: Depression  Past Medical History:  Past Medical History:  Diagnosis Date  . Bladder cancer ([place])   . Bundle branch block, right   . Carotid stenosis   . Colon polyp   . CVA (cerebral infarction)   . Diabetes ([place])   . Gout   . High cholesterol   . Hypertension   . Mitral valve prolapse   . Prostate cancer ([place])   . Tremor     Past Surgical History:  Procedure Laterality Date  . APPENDECTOMY      Family History:  Family History  Problem Relation Age of Onset  . Hypertension Father   . Stroke Father   . Diabetes Mother     Social History:  reports that he quit smoking about 5 years ago. His smoking use included Pipe. He has never used smokeless tobacco. He reports that he drinks alcohol. He reports that he does not use drugs.  Additional Social History:  Alcohol / Drug Use History of alcohol / drug use?: No history  of alcohol / drug abuse  CIWA: CIWA-Ar BP: (!) 165/77 Pulse Rate: (!) 58 COWS:    Allergies:  Allergies  Allergen Reactions  . Demerol [Meperidine] Nausea And Vomiting    Home Medications:  (Not in a hospital admission)  OB/GYN Status:  No LMP for male patient.  General Assessment Data Location of Assessment: [facility] ED TTS Assessment: In system Is this a Tele or Face-to-Face Assessment?: Face-to-Face Is this an Initial Assessment or a Re-assessment for this encounter?: Initial Assessment Marital status: Married [name] name: n/a Is patient pregnant?: No Pregnancy Status: No Living Arrangements: Spouse/significant other Can pt return to current living arrangement?: Yes Admission Status: Voluntary Is patient capable of signing voluntary admission?: Yes Referral Source: Self/Family/Friend Insurance type: Medicare & [employer] [place] Exam ([place]) Medical Exam completed: Yes  Crisis Care Plan Living Arrangements: Spouse/significant other Legal Guardian: Other: (Self) Name of Psychiatrist: None Name of Therapist: None  Education Status Is patient currently in school?: No Current Grade: n/a Highest grade of school patient has completed: [place] Degree Name of school: [employer] Contact person: n/a  Risk to self with the past 6 months Suicidal Ideation: Yes-Currently Present Has patient been a risk to  self within the past 6 months prior to admission? : No Suicidal Intent: No Has patient had any suicidal intent within the past 6 months prior to admission? : No Is patient at risk for suicide?: No Suicidal Plan?: No Has patient had any suicidal plan within the past 6 months prior to admission? : No Access to Means: No What has been your use of drugs/alcohol within the last 12 months?: Cocktails with dinner Previous Attempts/Gestures: No How many times?: 0 Other Self Harm Risks: denied Triggers for Past Attempts:  None known Intentional Self Injurious Behavior: None Family Suicide History: No Recent stressful life event(s):  (computer hackesd, health problems) Persecutory voices/beliefs?: No Depression: Yes Depression Symptoms: Tearfulness Substance abuse history and/or treatment for substance abuse?: No Suicide prevention information given to non-admitted patients: Not applicable  Risk to Others within the past 6 months Homicidal Ideation: No Does patient have any lifetime risk of violence toward others beyond the six months prior to admission? : No Thoughts of Harm to Others: No Current Homicidal Intent: No Current Homicidal Plan: No Access to Homicidal Means: No Identified Victim: None History of harm to others?: No Assessment of Violence: None Noted Violent Behavior Description: denied Does patient have access to weapons?: No Criminal Charges Pending?: No Does patient have a court date: No Is patient on probation?: No  Psychosis Hallucinations: None noted Delusions: None noted  Mental Status Report Appearance/Hygiene: Unremarkable Eye Contact: Good Motor Activity: Unremarkable Speech: Soft (Did not have his hearing aids in) Level of Consciousness: Quiet/awake Mood: Depressed Affect: Flat Anxiety Level: None Thought Processes: Coherent Judgement: Unimpaired Orientation: Person, Place, Time, Situation Obsessive Compulsive Thoughts/Behaviors: None  Cognitive Functioning Concentration: Normal Memory:  (Wife reports poor short term memory) IQ: Average Insight: Fair Impulse Control: Fair Appetite: Poor Sleep: Increased Vegetative Symptoms: None  ADLScreening [facility]) Patient's cognitive ability adequate to safely complete daily activities?: Yes Patient able to express need for assistance with ADLs?: Yes Independently performs ADLs?: Yes (appropriate for developmental age)  Prior Inpatient Therapy Prior Inpatient Therapy: No Prior Therapy Dates:  n/a Prior Therapy Facilty/Provider(s): n/a Reason for Treatment: n/a  Prior Outpatient Therapy Prior Outpatient Therapy: No Prior Therapy Dates: n/a Prior Therapy Facilty/Provider(s): n/a Reason for Treatment: n/a Does patient have an ACCT team?: No Does patient have Intensive In-House Services?  : No Does patient have [facility] services? : No Does patient have P4CC services?: No  ADL Screening (condition at time of admission) Patient's cognitive ability adequate to safely complete daily activities?: Yes Patient able to express need for assistance with ADLs?: Yes Independently performs ADLs?: Yes (appropriate for developmental age)       Abuse/Neglect Assessment (Assessment to be complete while patient is alone) Physical Abuse: Denies Verbal Abuse: Denies Sexual Abuse: Denies Exploitation of patient/patient's resources: Denies Self-Neglect: Denies     Regulatory affairs officer (For Healthcare) Does patient have an advance directive?: Yes Type of Advance Directive: Out of facility DNR (pink MOST or yellow form) Pre-existing out of facility DNR order (yellow form or pink MOST form): Yellow form placed in chart (order not valid for inpatient use) Copy of advanced directive(s) in chart?: Yes    Additional Information 1:1 In Past 12 Months?: No CIRT Risk: No Elopement Risk: No Does patient have medical clearance?: Yes     Disposition:  Disposition Initial Assessment Completed for this Encounter: Yes Disposition of Patient: Other dispositions  On Site Evaluation by:   Reviewed with Physician:    Elmer Bales 10/22/2016 5:27 AM

## 2016-10-22 NOTE — ED Notes (Signed)
Report to Iris, RN  

## 2016-10-22 NOTE — ED Notes (Signed)
Called pharmacy to verify and send patient medication.

## 2016-10-22 NOTE — ED Provider Notes (Signed)
[facility] Emergency Department Provider Note   ____________________________________________   First MD Initiated Contact with Patient 10/22/16 [phone]     (approximate)  I have reviewed the triage vital signs and the nursing notes.   HISTORY  Chief Complaint Weakness and Feeling of impending doom    HPI Dean Garcia is a 82 y.o. male who comes into the hospital today feeling as though he is going to explode. The patient woke his wife up at 1:30 making this statement. He reports that he has suffered from with spasms in his legs and his ankles for some time. He reports they developed overnight and he rolled over. He reports though that and set up subsiding the spasms went away and he started having this feeling as if he was going to explode. The patient reports that he still having the symptoms. He is unable to explain and becomes tearful when trying to explain. He denies any chest pain denies any nausea or vomiting denies any abdominal pain denies any dizziness or headache. He does have some difficulty breathing but reports that that is not abnormal for him.The patient reports that he has been urinating well. He had some weakness about a week ago but was not evaluated. His wife thinks he might be possibly dehydrated. He did have some stomach pain yesterday afternoon but that is also resolved. He is also had vertigo for years which he's been able to tolerate. The patient is here for evaluation of these symptoms. The patient's wife did pull me aside and states that the patient is depressed and she feels that this is a combination of his depression. She reports that he did make a statement that he wishes someone to give him a shot and it all. The patient's wife is concerned which is why he is here.   Past Medical History:  Diagnosis Date  . Bladder cancer ([place])   . Bundle branch block, right   . Carotid stenosis   . Colon polyp   . CVA (cerebral infarction)   .  Diabetes ([place])   . Gout   . High cholesterol   . Hypertension   . Mitral valve prolapse   . Prostate cancer ([place])   . Tremor     Patient Active Problem List   Diagnosis Date Noted  . Paroxysmal atrial fibrillation ([place]) 02/21/2016  . A-fib ([place]) 02/12/2016  . Centrilobular emphysema ([place]) 12/27/2015  . B12 deficiency 04/12/2015  . Atherosclerosis of aorta ([place]) 10/23/2014  . 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 Has a tremor 07/17/2014  . Type 2 diabetes mellitus ([place]) 07/17/2014  . Edema 07/17/2014  . Billowing mitral valve 07/12/2014  . BP (high blood pressure) 07/12/2014  . Carotid artery narrowing 07/12/2014  . Impaired renal function 06/07/2014  . Proctitis, radiation 06/07/2014  . Malignant neoplasm of prostate ([place]) 06/07/2014  . 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 Arthritis, degenerative 06/07/2014  . 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 Gout 06/07/2014  . 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 DD (diverticular disease) 06/07/2014  . Disease of rectum 06/07/2014  . Diabetes mellitus ([place]) 06/07/2014  . Malignant neoplasm of urinary bladder ([place]) 06/07/2014    Past Surgical History:  Procedure Laterality Date  . APPENDECTOMY      Prior to Admission medications   Medication Sig Start Date End Date Taking? Authorizing Provider  allopurinol (ZYLOPRIM) 100 MG tablet Take 100 mg by mouth daily.   Yes Historical Provider, MD  amLODipine (  NORVASC) 5 MG tablet Take 5 mg by mouth daily.   Yes Historical Provider, MD  apixaban (ELIQUIS) 2.5 MG TABS tablet Take 2.5 mg by mouth 2 (two) times daily.  02/21/16  Yes Historical Provider, MD  atorvastatin (LIPITOR) 40 MG tablet Take 40 mg by mouth at bedtime.   Yes Historical Provider, MD  cyanocobalamin (,VITAMIN B-12,) 1000 MCG/ML injection Inject 1,000 mcg into the muscle every 30 (thirty) days.  10/31/15  Yes Historical Provider, MD  diltiazem (CARDIZEM CD) 240 MG 24 hr  capsule Take 1 capsule (240 mg total) by mouth daily. 02/14/16  Yes Lytle But[place], MD  fexofenadine (ALLEGRA) 180 MG tablet Take 180 mg by mouth as needed for allergies or rhinitis.   Yes Historical Provider, MD  glucose blood (ACCU-CHEK COMPACT PLUS) test strip  12/07/15  Yes Historical Provider, MD  insulin lispro protamine-lispro (HUMALOG 75/25 MIX) (75-25) 100 UNIT/ML SUSP injection Inject 10 Units into the skin daily before supper.   Yes Historical Provider, MD  Insulin Pen Needle (B-D ULTRAFINE III SHORT PEN) 31G X 8 MM MISC  04/28/14  Yes Historical Provider, MD  losartan (COZAAR) 50 MG tablet Take 50 mg by mouth daily.   Yes Historical Provider, MD  metolazone (ZAROXOLYN) 2.5 MG tablet Take 2.5 mg by mouth every other day.    Yes Historical Provider, MD  omeprazole (PRILOSEC) 20 MG capsule Take 20 mg by mouth daily.  03/09/16 03/09/17 Yes Historical Provider, MD  ondansetron (ZOFRAN-ODT) 4 MG disintegrating tablet Take 4 mg by mouth every 8 (eight) hours as needed for nausea or vomiting.    Yes Historical Provider, MD  primidone (MYSOLINE) 50 MG tablet Take 50 mg by mouth 2 (two) times daily.    Yes Historical Provider, MD  propranolol (INDERAL) 10 MG tablet Take 10 mg by mouth 2 (two) times daily.    Yes Historical Provider, MD  ranitidine (ZANTAC) 150 MG tablet Take 300 mg by mouth every evening.    Yes Historical Provider, MD  sitaGLIPtin (JANUVIA) 25 MG tablet Take 25 mg by mouth daily.    Yes Historical Provider, MD  torsemide (DEMADEX) 20 MG tablet Take 40 mg by mouth daily.   Yes Historical Provider, MD    Allergies Demerol [meperidine]  Family History  Problem Relation Age of Onset  . Hypertension Father   . Stroke Father   . Diabetes Mother     Social History Social History  Substance Use Topics  . Smoking status: Former Smoker    Types: Pipe    Quit date: 10/29/2010  . Smokeless tobacco: Never Used  . Alcohol use Yes    Review of Systems Constitutional: No  fever/chills Eyes: No visual changes. ENT: No sore throat. Cardiovascular: Denies chest pain. Respiratory:  shortness of breath. Gastrointestinal: No abdominal pain.  No nausea, no vomiting.  No diarrhea.  No constipation. Genitourinary: Negative for dysuria. Musculoskeletal: Negative for back pain. Skin: Negative for rash. Neurological: Negative for headaches, focal weakness or numbness. Psychiatric:No suicidal or homicidal ideation  10-point ROS otherwise negative.  ____________________________________________   PHYSICAL EXAM:  VITAL SIGNS: ED Triage Vitals  Enc Vitals Group     BP 10/22/16 0255 (!) 165/77     Pulse Rate 10/22/16 0255 (!) 58     Resp 10/22/16 0255 (!) 24     Temp 10/22/16 0255 98.4 F (36.9 C)     Temp Source 10/22/16 0255 Oral     SpO2 10/22/16 0255 98 %  Weight 10/22/16 0257 165 lb (74.8 kg)     Height 10/22/16 0257 6\' 1"  (1.854 m)     Head Circumference --      Peak Flow --      Pain Score --      Pain Loc --      Pain Edu? --      Excl. in [place]? --     Constitutional: Alert and oriented. Well appearing and in no acute distress occasionally tearful Eyes: Conjunctivae are normal. PERRL. EOMI. Head: Atraumatic. Nose: No congestion/rhinnorhea. Mouth/Throat: Mucous membranes are moist.  Oropharynx non-erythematous. Cardiovascular: Normal rate, regular rhythm. Grossly normal heart sounds.  Good peripheral circulation. Respiratory: Normal respiratory effort.  No retractions. Lungs CTAB. Gastrointestinal: Soft and nontender. No distention.  Musculoskeletal: No lower extremity tenderness nor edema. Neurologic:  Normal speech and language.  Skin:  Skin is warm, dry and intact. Psychiatric: Mood and affect are normal.   ____________________________________________   LABS (all labs ordered are listed, but only abnormal results are displayed)  Labs Reviewed  CBC - Abnormal; Notable for the following:       Result Value   RBC 4.21 (*)    HCT  39.3 (*)    RDW 14.9 (*)    All other components within normal limits  COMPREHENSIVE METABOLIC PANEL - Abnormal; Notable for the following:    Chloride 94 (*)    Glucose, Bld 143 (*)    BUN 69 (*)    Creatinine, Ser 3.00 (*)    Calcium 8.4 (*)    ALT 12 (*)    GFR calc non Af Amer 17 (*)    GFR calc Af Amer 20 (*)    All other components within normal limits  TROPONIN I - Abnormal; Notable for the following:    Troponin I 0.03 (*)    All other components within normal limits  ACETAMINOPHEN LEVEL - Abnormal; Notable for the following:    Acetaminophen (Tylenol), Serum <10 (*)    All other components within normal limits  TROPONIN I - Abnormal; Notable for the following:    Troponin I 0.03 (*)    All other components within normal limits  ETHANOL  SALICYLATE LEVEL  URINALYSIS COMPLETEWITH MICROSCOPIC ([facility] ONLY)  URINE DRUG SCREEN, QUALITATIVE ([facility] ONLY)   ____________________________________________  EKG  ED ECG REPORT I, Loney Hering, the attending physician, personally viewed and interpreted this ECG.   Date: 10/22/2016  EKG Time: 241  Rate: 55  Rhythm: normal sinus rhythm  Axis: normal  Intervals:none  ST&T Change: none  ____________________________________________  RADIOLOGY  CXR ____________________________________________   PROCEDURES  Procedure(s) performed: None  Procedures  Critical Care performed: No  ____________________________________________   INITIAL IMPRESSION / ASSESSMENT AND PLAN / ED COURSE  Pertinent labs & imaging results that were available during my care of the patient were reviewed by me and considered in my medical decision making (see chart for details).  This is an 82 year old male who comes into the hospital today with some vague complaint of feeling as though he is going to explode. He also did tell EMS that he had the sense of impending doom. I did check some blood work to include 2 troponins were negative. The  patient's chest x-ray is also negative. I am sure what the symptoms are due to but the patient's family is concerned about him. His wife thinks that he may be having problems of depression. I did have TTS evaluate the patient and they feel  that a pack should also see the patient. The patient's blood work is unremarkable at this time. Patient will be evaluated by the psych service.  Clinical Course as of Oct 23 823  [facility] Oct 22, 2016  0426 1. No acute cardiopulmonary process seen. 2. Small to moderate hiatal hernia noted.   DG Chest 2 View [AW]    Clinical Course User Index [AW] Loney Hering, MD     ____________________________________________   FINAL CLINICAL IMPRESSION(S) / ED DIAGNOSES  Final diagnoses:  Weakness  Sense of impending doom      NEW MEDICATIONS STARTED DURING THIS VISIT:  New Prescriptions   No medications on file     Note:  This document was prepared using Dragon voice recognition software and may include unintentional dictation errors.    Loney Hering, MD 10/22/16 [phone]

## 2016-10-30 DIAGNOSIS — M6281 Muscle weakness (generalized): Secondary | ICD-10-CM

## 2016-10-30 DIAGNOSIS — F39 Unspecified mood [affective] disorder: Secondary | ICD-10-CM

## 2016-10-30 DIAGNOSIS — I480 Paroxysmal atrial fibrillation: Secondary | ICD-10-CM

## 2016-10-30 DIAGNOSIS — E119 Type 2 diabetes mellitus without complications: Secondary | ICD-10-CM

## 2016-10-30 DIAGNOSIS — I10 Essential (primary) hypertension: Secondary | ICD-10-CM | POA: Diagnosis not present

## 2016-11-06 DIAGNOSIS — M79676 Pain in unspecified toe(s): Secondary | ICD-10-CM | POA: Diagnosis not present

## 2016-11-19 ENCOUNTER — Inpatient Hospital Stay: Payer: Medicare Other

## 2016-11-19 ENCOUNTER — Inpatient Hospital Stay
Admission: EM | Admit: 2016-11-19 | Discharge: 2016-11-23 | DRG: 684 | Disposition: A | Discharge status: Home Health | Payer: Medicare Other | Attending: Internal Medicine | Admitting: Internal Medicine

## 2016-11-19 ENCOUNTER — Emergency Department: Payer: Medicare Other

## 2016-11-19 DIAGNOSIS — I4510 Unspecified right bundle-branch block: Secondary | ICD-10-CM | POA: Diagnosis present

## 2016-11-19 DIAGNOSIS — Z888 Allergy status to other drugs, medicaments and biological substances status: Secondary | ICD-10-CM

## 2016-11-19 DIAGNOSIS — E876 Hypokalemia: Secondary | ICD-10-CM | POA: Diagnosis not present

## 2016-11-19 DIAGNOSIS — Z79899 Other long term (current) drug therapy: Secondary | ICD-10-CM

## 2016-11-19 DIAGNOSIS — N184 Chronic kidney disease, stage 4 (severe): Secondary | ICD-10-CM | POA: Diagnosis present

## 2016-11-19 DIAGNOSIS — I341 Nonrheumatic mitral (valve) prolapse: Secondary | ICD-10-CM | POA: Diagnosis present

## 2016-11-19 DIAGNOSIS — R2681 Unsteadiness on feet: Secondary | ICD-10-CM

## 2016-11-19 DIAGNOSIS — R34 Anuria and oliguria: Secondary | ICD-10-CM | POA: Diagnosis present

## 2016-11-19 DIAGNOSIS — D631 Anemia in chronic kidney disease: Secondary | ICD-10-CM | POA: Diagnosis present

## 2016-11-19 DIAGNOSIS — Z515 Encounter for palliative care: Secondary | ICD-10-CM

## 2016-11-19 DIAGNOSIS — N189 Chronic kidney disease, unspecified: Secondary | ICD-10-CM | POA: Diagnosis not present

## 2016-11-19 DIAGNOSIS — R531 Weakness: Secondary | ICD-10-CM | POA: Diagnosis not present

## 2016-11-19 DIAGNOSIS — Z923 Personal history of irradiation: Secondary | ICD-10-CM

## 2016-11-19 DIAGNOSIS — H9190 Unspecified hearing loss, unspecified ear: Secondary | ICD-10-CM | POA: Diagnosis present

## 2016-11-19 DIAGNOSIS — Z87891 Personal history of nicotine dependence: Secondary | ICD-10-CM

## 2016-11-19 DIAGNOSIS — Z86010 Personal history of colonic polyps: Secondary | ICD-10-CM

## 2016-11-19 DIAGNOSIS — M6281 Muscle weakness (generalized): Secondary | ICD-10-CM

## 2016-11-19 DIAGNOSIS — I4891 Unspecified atrial fibrillation: Secondary | ICD-10-CM | POA: Diagnosis present

## 2016-11-19 DIAGNOSIS — Z8551 Personal history of malignant neoplasm of bladder: Secondary | ICD-10-CM

## 2016-11-19 DIAGNOSIS — G250 Essential tremor: Secondary | ICD-10-CM | POA: Diagnosis present

## 2016-11-19 DIAGNOSIS — R262 Difficulty in walking, not elsewhere classified: Secondary | ICD-10-CM

## 2016-11-19 DIAGNOSIS — N179 Acute kidney failure, unspecified: Secondary | ICD-10-CM | POA: Diagnosis present

## 2016-11-19 DIAGNOSIS — E1122 Type 2 diabetes mellitus with diabetic chronic kidney disease: Secondary | ICD-10-CM | POA: Diagnosis present

## 2016-11-19 DIAGNOSIS — L8990 Pressure ulcer of unspecified site, unspecified stage: Secondary | ICD-10-CM | POA: Insufficient documentation

## 2016-11-19 DIAGNOSIS — Z7189 Other specified counseling: Secondary | ICD-10-CM | POA: Diagnosis not present

## 2016-11-19 DIAGNOSIS — Z8673 Personal history of transient ischemic attack (TIA), and cerebral infarction without residual deficits: Secondary | ICD-10-CM | POA: Diagnosis not present

## 2016-11-19 DIAGNOSIS — I129 Hypertensive chronic kidney disease with stage 1 through stage 4 chronic kidney disease, or unspecified chronic kidney disease: Secondary | ICD-10-CM | POA: Diagnosis present

## 2016-11-19 DIAGNOSIS — R627 Adult failure to thrive: Secondary | ICD-10-CM | POA: Diagnosis present

## 2016-11-19 DIAGNOSIS — Z8249 Family history of ischemic heart disease and other diseases of the circulatory system: Secondary | ICD-10-CM

## 2016-11-19 DIAGNOSIS — Z8546 Personal history of malignant neoplasm of prostate: Secondary | ICD-10-CM

## 2016-11-19 DIAGNOSIS — Z66 Do not resuscitate: Secondary | ICD-10-CM | POA: Diagnosis present

## 2016-11-19 DIAGNOSIS — M109 Gout, unspecified: Secondary | ICD-10-CM | POA: Diagnosis present

## 2016-11-19 DIAGNOSIS — Z794 Long term (current) use of insulin: Secondary | ICD-10-CM

## 2016-11-19 LAB — TROPONIN I: Troponin I: 0.03 ng/mL (ref <0.03)

## 2016-11-19 LAB — COMPREHENSIVE METABOLIC PANEL
ALBUMIN: 3 g/dL — AB (ref 3.5–5.0)
ALT: 11 U/L — ABNORMAL LOW (ref 17–63)
ANION GAP: 13 (ref 5–15)
AST: 25 U/L (ref 15–41)
Alkaline Phosphatase: 61 U/L (ref 38–126)
BUN: 85 mg/dL — ABNORMAL HIGH (ref 6–20)
CO2: 31 mmol/L (ref 22–32)
Calcium: 8.7 mg/dL — ABNORMAL LOW (ref 8.9–10.3)
Chloride: 102 mmol/L (ref 101–111)
Creatinine, Ser: 4.51 mg/dL — ABNORMAL HIGH (ref 0.61–1.24)
GFR calc non Af Amer: 11 mL/min — ABNORMAL LOW (ref >60)
GFR, EST AFRICAN AMERICAN: 12 mL/min — AB (ref >60)
GLUCOSE: 189 mg/dL — AB (ref 65–99)
POTASSIUM: 3.7 mmol/L (ref 3.5–5.1)
SODIUM: 146 mmol/L — AB (ref 135–145)
Total Bilirubin: 0.1 mg/dL — ABNORMAL LOW (ref 0.3–1.2)
Total Protein: 6.6 g/dL (ref 6.5–8.1)

## 2016-11-19 LAB — CBC WITH DIFFERENTIAL/PLATELET
BASOS PCT: 0 %
Basophils Absolute: 0 10*3/uL (ref 0–0.1)
EOS ABS: 0.1 10*3/uL (ref 0–0.7)
EOS PCT: 1 %
HCT: 39 % — ABNORMAL LOW (ref 40.0–52.0)
Hemoglobin: 12.7 g/dL — ABNORMAL LOW (ref 13.0–18.0)
Lymphocytes Relative: 5 %
Lymphs Abs: 0.6 10*3/uL — ABNORMAL LOW (ref 1.0–3.6)
MCH: 30.7 pg (ref 26.0–34.0)
MCHC: 32.6 g/dL (ref 32.0–36.0)
MCV: 94.3 fL (ref 80.0–100.0)
MONO ABS: 0.9 10*3/uL (ref 0.2–1.0)
MONOS PCT: 8 %
NEUTROS PCT: 86 %
Neutro Abs: 10.3 10*3/uL — ABNORMAL HIGH (ref 1.4–6.5)
PLATELETS: 241 10*3/uL (ref 150–440)
RBC: 4.13 MIL/uL — ABNORMAL LOW (ref 4.40–5.90)
RDW: 15.2 % — AB (ref 11.5–14.5)
WBC: 11.9 10*3/uL — ABNORMAL HIGH (ref 3.8–10.6)

## 2016-11-19 LAB — GLUCOSE, CAPILLARY
GLUCOSE-CAPILLARY: 139 mg/dL — AB (ref 65–99)
Glucose-Capillary: 121 mg/dL — ABNORMAL HIGH (ref 65–99)

## 2016-11-19 LAB — URINALYSIS, ROUTINE W REFLEX MICROSCOPIC
BILIRUBIN URINE: NEGATIVE
GLUCOSE, UA: NEGATIVE mg/dL
HGB URINE DIPSTICK: NEGATIVE
KETONES UR: NEGATIVE mg/dL
LEUKOCYTES UA: NEGATIVE
Nitrite: NEGATIVE
PROTEIN: NEGATIVE mg/dL
Specific Gravity, Urine: 1.013 (ref 1.005–1.030)
pH: 5 (ref 5.0–8.0)

## 2016-11-19 LAB — MAGNESIUM: Magnesium: 2.2 mg/dL (ref 1.7–2.4)

## 2016-11-19 MED ORDER — ONDANSETRON HCL 4 MG PO TABS: 4.0000 mg | ORAL_TABLET | Freq: Four times a day (QID) | ORAL | Status: DC | PRN

## 2016-11-19 MED ORDER — ONDANSETRON HCL 4 MG/2ML IJ SOLN: 4.0000 mg | Freq: Four times a day (QID) | INTRAMUSCULAR | Status: DC | PRN

## 2016-11-19 MED ORDER — DILTIAZEM HCL ER COATED BEADS 240 MG PO CP24
240.0000 mg | ORAL_CAPSULE | Freq: Every day | ORAL | Status: DC
Start: 2016-11-20 — End: 2016-11-23
  Administered 2016-11-20 – 2016-11-22 (×3): 240 mg via ORAL
  Filled 2016-11-19 (×3): qty 1

## 2016-11-19 MED ORDER — INSULIN ASPART 100 UNIT/ML ~~LOC~~ SOLN
0.0000 [IU] | Freq: Three times a day (TID) | SUBCUTANEOUS | Status: DC
  Administered 2016-11-19 – 2016-11-20 (×2): 1 [IU] via SUBCUTANEOUS
  Administered 2016-11-20 – 2016-11-23 (×4): 2 [IU] via SUBCUTANEOUS
  Filled 2016-11-19: qty 1
  Filled 2016-11-19 (×3): qty 2
  Filled 2016-11-19: qty 1
  Filled 2016-11-19: qty 2

## 2016-11-19 MED ORDER — AMLODIPINE BESYLATE 5 MG PO TABS
5.0000 mg | ORAL_TABLET | Freq: Every day | ORAL | Status: DC
  Administered 2016-11-20 – 2016-11-22 (×2): 5 mg via ORAL
  Filled 2016-11-19 (×2): qty 1

## 2016-11-19 MED ORDER — LINAGLIPTIN 5 MG PO TABS
5.0000 mg | ORAL_TABLET | Freq: Every day | ORAL | Status: DC
  Administered 2016-11-20: 5 mg via ORAL
  Filled 2016-11-19: qty 1

## 2016-11-19 MED ORDER — INSULIN ASPART 100 UNIT/ML ~~LOC~~ SOLN: 0.0000 [IU] | Freq: Every day | SUBCUTANEOUS | Status: DC

## 2016-11-19 MED ORDER — ALLOPURINOL 100 MG PO TABS
100.0000 mg | ORAL_TABLET | Freq: Every day | ORAL | Status: DC
  Administered 2016-11-20 – 2016-11-23 (×4): 100 mg via ORAL
  Filled 2016-11-19 (×4): qty 1

## 2016-11-19 MED ORDER — APIXABAN 2.5 MG PO TABS
2.5000 mg | ORAL_TABLET | Freq: Two times a day (BID) | ORAL | Status: DC
  Administered 2016-11-19 – 2016-11-23 (×8): 2.5 mg via ORAL
  Filled 2016-11-19 (×8): qty 1

## 2016-11-19 MED ORDER — PANTOPRAZOLE SODIUM 40 MG PO TBEC
40.0000 mg | DELAYED_RELEASE_TABLET | Freq: Every day | ORAL | Status: DC
  Administered 2016-11-20 – 2016-11-23 (×4): 40 mg via ORAL
  Filled 2016-11-19 (×5): qty 1

## 2016-11-19 MED ORDER — MIRTAZAPINE 15 MG PO TABS
15.0000 mg | ORAL_TABLET | Freq: Every day | ORAL | Status: DC
  Administered 2016-11-19 – 2016-11-22 (×4): 15 mg via ORAL
  Filled 2016-11-19 (×4): qty 1

## 2016-11-19 MED ORDER — INSULIN ASPART PROT & ASPART (70-30 MIX) 100 UNIT/ML ~~LOC~~ SUSP
5.0000 [IU] | Freq: Two times a day (BID) | SUBCUTANEOUS | Status: DC
  Administered 2016-11-20: 5 [IU] via SUBCUTANEOUS
  Filled 2016-11-19: qty 5

## 2016-11-19 MED ORDER — FAMOTIDINE 20 MG PO TABS
20.0000 mg | ORAL_TABLET | Freq: Every day | ORAL | Status: DC
  Administered 2016-11-19 – 2016-11-22 (×4): 20 mg via ORAL
  Filled 2016-11-19 (×4): qty 1

## 2016-11-19 MED ORDER — LORATADINE 10 MG PO TABS
10.0000 mg | ORAL_TABLET | Freq: Every day | ORAL | Status: DC
  Administered 2016-11-20 – 2016-11-23 (×4): 10 mg via ORAL
  Filled 2016-11-19 (×4): qty 1

## 2016-11-19 MED ORDER — ACETAMINOPHEN 650 MG RE SUPP: 650.0000 mg | Freq: Four times a day (QID) | RECTAL | Status: DC | PRN

## 2016-11-19 MED ORDER — SODIUM CHLORIDE 0.9 % IV BOLUS (SEPSIS)
500.0000 mL | INTRAVENOUS | Status: AC
  Administered 2016-11-19: 500 mL via INTRAVENOUS

## 2016-11-19 MED ORDER — SODIUM CHLORIDE 0.9% FLUSH
3.0000 mL | Freq: Two times a day (BID) | INTRAVENOUS | Status: DC
  Administered 2016-11-19 – 2016-11-23 (×2): 3 mL via INTRAVENOUS

## 2016-11-19 MED ORDER — PROPRANOLOL HCL 10 MG PO TABS
10.0000 mg | ORAL_TABLET | Freq: Two times a day (BID) | ORAL | Status: DC
  Administered 2016-11-19 – 2016-11-20 (×3): 10 mg via ORAL
  Filled 2016-11-19 (×5): qty 1

## 2016-11-19 MED ORDER — SODIUM CHLORIDE 0.9 % IV SOLN
INTRAVENOUS | Status: DC
  Administered 2016-11-19 – 2016-11-22 (×6): via INTRAVENOUS

## 2016-11-19 MED ORDER — CYANOCOBALAMIN 1000 MCG/ML IJ SOLN
1000.0000 ug | INTRAMUSCULAR | Status: DC
  Administered 2016-11-20: 1000 ug via INTRAMUSCULAR
  Filled 2016-11-19: qty 1

## 2016-11-19 MED ORDER — ACETAMINOPHEN 325 MG PO TABS
650.0000 mg | ORAL_TABLET | Freq: Four times a day (QID) | ORAL | Status: DC | PRN
  Administered 2016-11-19 – 2016-11-22 (×5): 650 mg via ORAL
  Filled 2016-11-19 (×5): qty 2

## 2016-11-19 MED ORDER — PRIMIDONE 50 MG PO TABS
50.0000 mg | ORAL_TABLET | Freq: Two times a day (BID) | ORAL | Status: DC
Start: 2016-11-19 — End: 2016-11-23
  Administered 2016-11-19 – 2016-11-23 (×8): 50 mg via ORAL
  Filled 2016-11-19 (×8): qty 1

## 2016-11-19 MED ORDER — ATORVASTATIN CALCIUM 20 MG PO TABS
40.0000 mg | ORAL_TABLET | Freq: Every day | ORAL | Status: DC
  Administered 2016-11-20 – 2016-11-22 (×3): 40 mg via ORAL
  Filled 2016-11-19 (×3): qty 2

## 2016-11-19 NOTE — ED Notes (Signed)
Pt attempted to urinate into urinal, unable to go at this time.

## 2016-11-19 NOTE — ED Notes (Signed)
Pt oxygen kept dropped to mid 80's, placed on 2 L nasal cannula. Will continue to monitor.

## 2016-11-19 NOTE — ED Notes (Signed)
Pt taken to US via stretcher

## 2016-11-19 NOTE — ED Notes (Signed)
Pt had small brown BM in diaper. Pt rolled, cleaned, dried and new diaper placed underneath him.

## 2016-11-19 NOTE — ED Notes (Signed)
Pt given cup of sprite.  

## 2016-11-19 NOTE — ED Notes (Signed)
Incontinent of small amount liquid brown stool.  Diaper changed.  Non blanchable area on coccyx.  Pt turned to side.

## 2016-11-19 NOTE — ED Notes (Signed)
Pt returned from U/S via stretcher. 

## 2016-11-19 NOTE — H&P (Addendum)
[facility] NAME: Dean Garcia    MR#:  [id number]  DATE OF BIRTH:  1928/06/30  DATE OF ADMISSION:  11/19/2016  PRIMARY CARE PHYSICIAN: Kirk Ruths., MD   REQUESTING/REFERRING PHYSICIAN: Dr. Hinda Kehr  CHIEF COMPLAINT:   Chief Complaint  Patient presents with  . Weakness    HISTORY OF PRESENT ILLNESS:  Dean Garcia  is a 82 y.o. male with a known history of Prostate cancer status post radiation, hypertension, insulin-dependent diabetes mellitus, history of stroke, atrial fibrillation on eliquis presents to hospital from [facility] independent living secondary to generalized weakness, decreased oral intake and inability to walk for the last few days. Patient is hard of hearing and most of the history is obtained from his wife at bedside. According to her, for the last few weeks his general health has been declining. His oral intake is decreased and his weakness was worsening. Last time he was in the hospital, he was started on antidepressant Remeron to help with same symptoms. However it hasn't improved his appetite much. Patient states he feels full, does not want to eat or drink. Denies any nausea or vomiting. He has stage IV CK D at baseline and has refused dialysis in the past. Due to his weakness, he was transitioned over to [facility] for a few days and discharged back to his home recently. His intake has much worsened since then. Labs today show acute on chronic renal failure. Also has oliguria and weakness. He is being admitted for the same.  PAST MEDICAL HISTORY:   Past Medical History:  Diagnosis Date  . Bladder cancer ([place])   . Bundle branch block, right   . Carotid stenosis   . Colon polyp   . CVA (cerebral infarction)   . Diabetes ([place])   . Gout   . High cholesterol   . Hypertension   . Mitral valve prolapse   . Prostate cancer ([place])   . Tremor     PAST SURGICAL HISTORY:   Past Surgical History:   Procedure Laterality Date  . APPENDECTOMY      SOCIAL HISTORY:   Social History  Substance Use Topics  . Smoking status: Former Smoker    Types: Pipe    Quit date: 10/29/2010  . Smokeless tobacco: Never Used  . Alcohol use Yes    FAMILY HISTORY:   Family History  Problem Relation Age of Onset  . Hypertension Father   . Stroke Father   . Diabetes Mother     DRUG ALLERGIES:   Allergies  Allergen Reactions  . Demerol [Meperidine] Nausea And Vomiting    Nausea/vomiting    REVIEW OF SYSTEMS:   Review of Systems  Constitutional: Positive for malaise/fatigue. Negative for chills, fever and weight loss.  HENT: Positive for hearing loss. Negative for ear discharge, ear pain, nosebleeds and tinnitus.   Eyes: Positive for blurred vision. Negative for double vision and photophobia.  Respiratory: Positive for shortness of breath. Negative for cough, hemoptysis and wheezing.   Cardiovascular: Negative for chest pain, palpitations, orthopnea and leg swelling.  Gastrointestinal: Positive for abdominal pain. Negative for constipation, diarrhea, heartburn, melena, nausea and vomiting.  Genitourinary: Negative for dysuria.       Oliguric  Musculoskeletal: Positive for myalgias. Negative for back pain and neck pain.  Skin: Negative for rash.  Neurological: Positive for tremors. Negative for dizziness, sensory change, speech change, focal weakness and headaches.  Endo/Heme/Allergies: Does not bruise/bleed  easily.  Psychiatric/Behavioral: Negative for depression.    MEDICATIONS AT HOME:   Prior to Admission medications   Medication Sig Start Date End Date Taking? Authorizing Provider  allopurinol (ZYLOPRIM) 100 MG tablet Take 100 mg by mouth daily.   Yes Historical Provider, MD  amLODipine (NORVASC) 5 MG tablet Take 2.5 mg by mouth daily.    Yes Historical Provider, MD  apixaban (ELIQUIS) 2.5 MG TABS tablet Take 2.5 mg by mouth 2 (two) times daily.  02/21/16  Yes Historical  Provider, MD  cyanocobalamin (,VITAMIN B-12,) 1000 MCG/ML injection Inject 1,000 mcg into the muscle every 30 (thirty) days.  10/31/15  Yes Historical Provider, MD  diltiazem (CARDIZEM CD) 240 MG 24 hr capsule Take 1 capsule (240 mg total) by mouth daily. 02/14/16  Yes Lytle Butte, MD  fexofenadine (ALLEGRA) 180 MG tablet Take 180 mg by mouth as needed for allergies or rhinitis.   Yes Historical Provider, MD  insulin lispro protamine-lispro (HUMALOG 75/25 MIX) (75-25) 100 UNIT/ML SUSP injection Inject 10 Units into the skin daily before supper.   Yes Historical Provider, MD  losartan (COZAAR) 50 MG tablet Take 50 mg by mouth daily.   Yes Historical Provider, MD  metolazone (ZAROXOLYN) 2.5 MG tablet Take 2.5 mg by mouth every other day.    Yes Historical Provider, MD  mirtazapine (REMERON) 15 MG tablet Take 1 tablet (15 mg total) by mouth at bedtime. 10/22/16  Yes Gonzella Lex, MD  omeprazole (PRILOSEC) 20 MG capsule Take 20 mg by mouth daily.  03/09/16 03/09/17 Yes Historical Provider, MD  ondansetron (ZOFRAN-ODT) 4 MG disintegrating tablet Take 4 mg by mouth every 8 (eight) hours as needed for nausea or vomiting.    Yes Historical Provider, MD  potassium chloride (K-DUR) 10 MEQ tablet Take 10 mEq by mouth daily.   Yes Historical Provider, MD  primidone (MYSOLINE) 50 MG tablet Take 50 mg by mouth 2 (two) times daily.    Yes Historical Provider, MD  propranolol (INDERAL) 10 MG tablet Take 10 mg by mouth 2 (two) times daily.    Yes Historical Provider, MD  ranitidine (ZANTAC) 150 MG tablet Take 300 mg by mouth every evening.    Yes Historical Provider, MD  sitaGLIPtin (JANUVIA) 25 MG tablet Take 25 mg by mouth daily.    Yes Historical Provider, MD  torsemide (DEMADEX) 20 MG tablet Take 40 mg by mouth daily.   Yes Historical Provider, MD  atorvastatin (LIPITOR) 40 MG tablet Take 40 mg by mouth at bedtime.    Historical Provider, MD  glucose blood (ACCU-CHEK COMPACT PLUS) test strip  12/07/15   Historical  Provider, MD  Insulin Pen Needle (B-D ULTRAFINE III SHORT PEN) 31G X 8 MM MISC  04/28/14   Historical Provider, MD      VITAL SIGNS:  Blood pressure (!) 153/79, pulse 71, temperature 97.4 F (36.3 C), temperature source Oral, resp. rate (!) 31, height 6\' 1"  (1.854 m), weight 75.3 kg (166 lb), SpO2 98 %.  PHYSICAL EXAMINATION:   Physical Exam  GENERAL:  82 y.o.-year-old patient lying in the bed with no acute distress. Generalized weakness and chronically ill. EYES: Pupils equal, round, reactive to light and accommodation. No scleral icterus. Extraocular muscles intact.  HEENT: Head atraumatic, normocephalic. Oropharynx and nasopharynx clear.  NECK:  Supple, no jugular venous distention. No thyroid enlargement, no tenderness.  LUNGS: Normal breath sounds bilaterally, no wheezing, rales,rhonchi or crepitation. No use of accessory muscles of respiration. Decreased bibasilar breath sounds. CARDIOVASCULAR:  S1, S2 normal. No murmurs, rubs, or gallops.  ABDOMEN: Soft, umbilical hernia present. Tender in the left flank region, otherwise nontender, nondistended. Bowel sounds present. No organomegaly or mass.  EXTREMITIES: No pedal edema, cyanosis, or clubbing.  NEUROLOGIC: Cranial nerves II through XII are intact. Muscle strength 5/5 in all extremities. Sensation intact. Gait not checked. Global weakness noted PSYCHIATRIC: The patient is alert and oriented x 3.  SKIN: No obvious rash, lesion, or ulcer.   LABORATORY PANEL:   CBC  Recent Labs Lab 11/19/16 1129  WBC 11.9*  HGB 12.7*  HCT 39.0*  PLT 241   ------------------------------------------------------------------------------------------------------------------  Chemistries   Recent Labs Lab 11/19/16 1129  NA 146*  K 3.7  CL 102  CO2 31  GLUCOSE 189*  BUN 85*  CREATININE 4.51*  CALCIUM 8.7*  MG 2.2  AST 25  ALT 11*  ALKPHOS 61  BILITOT 0.1*    ------------------------------------------------------------------------------------------------------------------  Cardiac Enzymes  Recent Labs Lab 11/19/16 1129  TROPONINI 0.03*   ------------------------------------------------------------------------------------------------------------------  RADIOLOGY:  Dg Chest 2 View  Result Date: 11/19/2016 CLINICAL DATA:  Shortness of Breath EXAM: CHEST  2 VIEW COMPARISON:  October 22, 2016 FINDINGS: There is a calcified granuloma in the left upper lobe. There is no appreciable edema or consolidation. Heart size and pulmonary vascularity are normal. There is a sizable hiatal type hernia. No adenopathy. No bone lesions. IMPRESSION: Sizable hiatal hernia. Calcified granuloma left upper lobe. No edema or consolidation. Electronically Signed   By: Lowella Grip III M.D.   On: 11/19/2016 12:16    EKG:   Orders placed or performed during the hospital encounter of 11/19/16  . EKG 12-Lead  . EKG 12-Lead    IMPRESSION AND PLAN:   Dean Garcia  is a 82 y.o. male with a known history of Prostate cancer status post radiation, hypertension, insulin-dependent diabetes mellitus, history of stroke, atrial fibrillation on eliquis presents to hospital from [facility] independent living secondary to generalized weakness, decreased oral intake and inability to walk for the last few days.  #1 acute renal failure on CK D stage IV-we'll place a Foley catheter for strict input and output monitoring. Patient is complaining of urinary retention. -Renal ultrasound ordered. -Nephrology consult. Hold nephrotoxins. IV fluids ordered. -Discussed again about possibility of dialysis and patient is not sure at this time. We'll place palliative care consult. -Check urine analysis  #2 benign essential tremors-at baseline. Continue propranolol and primidone  #3 failure to thrive-continue antidepressant Remeron. Palliative care consulted  #4 diabetes  mellitus-continue 70/30 insulin twice a day and on Linagliptin here Add SSI  #5 Afib- chronic, rate controlled. On cardizem, also on eliquis for anti coagulation  #6 DVT prophylaxis-on eliquis    All the records are reviewed and case discussed with ED provider. Management plans discussed with the patient, family and they are in agreement.  CODE STATUS: DO NOT RESUSCITATE Has paperwork for DO NOT RESUSCITATE  TOTAL TIME TAKING CARE OF THIS PATIENT: 50 minutes.    Gladstone Lighter M.D on 11/19/2016 at 3:07 PM  Between 7am to 6pm - Pager - [phone]  After 6pm go to [email or web address] - password [facility]  Office  [phone]  CC: Primary care physician; Kirk Ruths., MD

## 2016-11-19 NOTE — ED Provider Notes (Signed)
[facility] Emergency Department Provider Note  ____________________________________________   None    (approximate)  I have reviewed the triage vital signs and the nursing notes.   HISTORY  Chief Complaint Weakness    HPI Dean Garcia is a 82 y.o. male who presents with complaints of generalized weakness, decreased oral intake, inability to walk, decreased urinary output, general malaise.  He has had gradually worsening symptoms for at least a month which also seemed to start when he was not wanting to eat.  He spent 10 days at [facility] and was discharged from [facility] about 10 days ago.  He has steadily gotten worse since that time.  Today he is unable to walk on his own, his lips are dry and cracked, and he has not producing any urine.  He denies fever/chills, chest pain, abdominal pain, nausea, vomiting.  He does state that he feels short of breath all the time but he is currently in no distress.  According to him and his wife his symptoms are severe and nothing is making them better and they are gradually getting worse over time.   Past Medical History:  Diagnosis Date  . Bladder cancer ([place])   . Bundle branch block, right   . Carotid stenosis   . Colon polyp   . CVA (cerebral infarction)   . Diabetes ([place])   . Gout   . High cholesterol   . Hypertension   . Mitral valve prolapse   . Prostate cancer ([place])   . Tremor     Patient Active Problem List   Diagnosis Date Noted  . ARF (acute renal failure) ([place]) 11/19/2016  . Moderate major depression, single episode ([place]) 10/22/2016  . Mild dementia 10/22/2016  . Paroxysmal atrial fibrillation ([place]) 02/21/2016  . A-fib ([place]) 02/12/2016  . Centrilobular emphysema ([place]) 12/27/2015  . B12 deficiency 04/12/2015  . Atherosclerosis of aorta ([place]) 10/23/2014  . 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 Has a tremor 07/17/2014  . Type 2 diabetes mellitus ([place]) 07/17/2014  . Edema 07/17/2014  . Billowing mitral  valve 07/12/2014  . BP (high blood pressure) 07/12/2014  . Carotid artery narrowing 07/12/2014  . Impaired renal function 06/07/2014  . Proctitis, radiation 06/07/2014  . Malignant neoplasm of prostate ([place]) 06/07/2014  . Peripheral nerve disease ([place]) 06/07/2014  . Arthritis, degenerative 06/07/2014  . Disorder of peripheral nervous system ([place]) 06/07/2014  . Gout 06/07/2014  . 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 DD (diverticular disease) 06/07/2014  . Disease of rectum 06/07/2014  . Diabetes mellitus ([place]) 06/07/2014  . Malignant neoplasm of urinary bladder ([place]) 06/07/2014    Past Surgical History:  Procedure Laterality Date  . APPENDECTOMY      Prior to Admission medications   Medication Sig Start Date End Date Taking? Authorizing Provider  allopurinol (ZYLOPRIM) 100 MG tablet Take 100 mg by mouth daily.    Historical Provider, MD  amLODipine (NORVASC) 5 MG tablet Take 5 mg by mouth daily.    Historical Provider, MD  apixaban (ELIQUIS) 2.5 MG TABS tablet Take 2.5 mg by mouth 2 (two) times daily.  02/21/16   Historical Provider, MD  atorvastatin (LIPITOR) 40 MG tablet Take 40 mg by mouth at bedtime.    Historical Provider, MD  cyanocobalamin (,VITAMIN B-12,) 1000 MCG/ML injection Inject 1,000 mcg into the muscle every 30 (thirty) days.  10/31/15   Historical Provider, MD  diltiazem (CARDIZEM CD) 240 MG 24 hr capsule Take 1 capsule (240 mg  total) by mouth daily. 02/14/16   Lytle Butte, MD  fexofenadine (ALLEGRA) 180 MG tablet Take 180 mg by mouth as needed for allergies or rhinitis.    Historical Provider, MD  glucose blood (ACCU-CHEK COMPACT PLUS) test strip  12/07/15   Historical Provider, MD  insulin lispro protamine-lispro (HUMALOG 75/25 MIX) (75-25) 100 UNIT/ML SUSP injection Inject 10 Units into the skin daily before supper.    Historical Provider, MD  Insulin Pen Needle (B-D ULTRAFINE III SHORT PEN) 31G X 8 MM MISC  04/28/14   Historical Provider, MD   losartan (COZAAR) 50 MG tablet Take 50 mg by mouth daily.    Historical Provider, MD  metolazone (ZAROXOLYN) 2.5 MG tablet Take 2.5 mg by mouth every other day.     Historical Provider, MD  mirtazapine (REMERON) 15 MG tablet Take 1 tablet (15 mg total) by mouth at bedtime. 10/22/16   Gonzella Lex, MD  omeprazole (PRILOSEC) 20 MG capsule Take 20 mg by mouth daily.  03/09/16 03/09/17  Historical Provider, MD  ondansetron (ZOFRAN-ODT) 4 MG disintegrating tablet Take 4 mg by mouth every 8 (eight) hours as needed for nausea or vomiting.     Historical Provider, MD  primidone (MYSOLINE) 50 MG tablet Take 50 mg by mouth 2 (two) times daily.     Historical Provider, MD  propranolol (INDERAL) 10 MG tablet Take 10 mg by mouth 2 (two) times daily.     Historical Provider, MD  ranitidine (ZANTAC) 150 MG tablet Take 300 mg by mouth every evening.     Historical Provider, MD  sitaGLIPtin (JANUVIA) 25 MG tablet Take 25 mg by mouth daily.     Historical Provider, MD  torsemide (DEMADEX) 20 MG tablet Take 40 mg by mouth daily.    Historical Provider, MD    Allergies Demerol [meperidine]  Family History  Problem Relation Age of Onset  . Hypertension Father   . Stroke Father   . Diabetes Mother     Social History Social History  Substance Use Topics  . Smoking status: Former Smoker    Types: Pipe    Quit date: 10/29/2010  . Smokeless tobacco: Never Used  . Alcohol use Yes    Review of Systems Constitutional: No fever/chills.  Gen. malaise and generalized weakness. Eyes: No visual changes. ENT: No sore throat. Cardiovascular: Denies chest pain. Respiratory: Denies shortness of breath. Gastrointestinal: Anorexia.  No abdominal pain.  No nausea, no vomiting.  No diarrhea.  No constipation. Genitourinary: Decreased urinary output. Musculoskeletal: Negative for back pain. Skin: Negative for rash. Neurological: Negative for headaches, focal weakness or numbness.  10-point ROS otherwise  negative.  ____________________________________________   PHYSICAL EXAM:  VITAL SIGNS: ED Triage Vitals [11/19/16 1127]  Enc Vitals Group     BP 118/62     Pulse Rate 61     Resp (!) 27     Temp 97.4 F (36.3 C)     Temp Source Oral     SpO2 100 %     Weight 166 lb (75.3 kg)     Height 6\' 1"  (1.854 m)     Head Circumference      Peak Flow      Pain Score 0     Pain Loc      Pain Edu?      Excl. in [place]?     Constitutional: Alert and oriented. Elderly with the appearance of chronic illness. Eyes: Conjunctivae are normal. PERRL. EOMI. Head: Atraumatic. Nose: No  congestion/rhinnorhea. Mouth/Throat: Mucous membranes are dry.   Neck: No stridor.  No meningeal signs.   Cardiovascular: Normal rate, regular rhythm. Good peripheral circulation. Grossly normal heart sounds. Respiratory: Normal respiratory effort.  No retractions. Lungs CTAB. Gastrointestinal: Soft with mild generalized abdominal tenderness throughout, no distention Musculoskeletal: No lower extremity tenderness nor edema. No gross deformities of extremities. Neurologic:  Normal speech and language. No gross focal neurologic deficits are appreciated but with decreased strength throughout Skin:  Skin is warm, dry and intact. No rash noted. Psychiatric: Mood and affect are normal. Speech and behavior are normal.  ____________________________________________   LABS (all labs ordered are listed, but only abnormal results are displayed)  Labs Reviewed  TROPONIN I - Abnormal; Notable for the following:       Result Value   Troponin I 0.03 (*)    All other components within normal limits  CBC WITH DIFFERENTIAL/PLATELET - Abnormal; Notable for the following:    WBC 11.9 (*)    RBC 4.13 (*)    Hemoglobin 12.7 (*)    HCT 39.0 (*)    RDW 15.2 (*)    Neutro Abs 10.3 (*)    Lymphs Abs 0.6 (*)    All other components within normal limits  COMPREHENSIVE METABOLIC PANEL - Abnormal; Notable for the following:    Sodium  146 (*)    Glucose, Bld 189 (*)    BUN 85 (*)    Creatinine, Ser 4.51 (*)    Calcium 8.7 (*)    Albumin 3.0 (*)    ALT 11 (*)    Total Bilirubin 0.1 (*)    GFR calc non Af Amer 11 (*)    GFR calc Af Amer 12 (*)    All other components within normal limits  MAGNESIUM  URINALYSIS, ROUTINE W REFLEX MICROSCOPIC   ____________________________________________  EKG  ED ECG REPORT I, Mariana Goytia, the attending physician, personally viewed and interpreted this ECG.  Date: 11/19/2016 EKG Time: 11:18 Rate: 57 Rhythm: sinus bradycardia vs junctional rhythm, difficult to assess P-waves due to baseline artifact QRS Axis: normal Intervals: RBBB ST/T Wave abnormalities: normal Conduction Disturbances: none Narrative Interpretation: No evidence of acute ischemia.  Significant EKG change from last month.  ____________________________________________  RADIOLOGY   Dg Chest 2 View  Result Date: 11/19/2016 CLINICAL DATA:  Shortness of Breath EXAM: CHEST  2 VIEW COMPARISON:  October 22, 2016 FINDINGS: There is a calcified granuloma in the left upper lobe. There is no appreciable edema or consolidation. Heart size and pulmonary vascularity are normal. There is a sizable hiatal type hernia. No adenopathy. No bone lesions. IMPRESSION: Sizable hiatal hernia. Calcified granuloma left upper lobe. No edema or consolidation. Electronically Signed   By: Lowella Grip III M.D.   On: 11/19/2016 12:16    ____________________________________________   PROCEDURES  Procedure(s) performed:   Procedures   Critical Care performed: No ____________________________________________   INITIAL IMPRESSION / ASSESSMENT AND PLAN / ED COURSE  Pertinent labs & imaging results that were available during my care of the patient were reviewed by me and considered in my medical decision making (see chart for details).  The patient's baseline renal function has gotten worse and his last creatinine of 3 has  worsened to a creatinine of 4.5 today with a BUN of 85.  He is obviously volume depleted with dry and cracked mucous membranes.  He admits that he is not eating and drinking well.  His vital signs are stable except for mild tachypnea but  he has no other respiratory symptoms, normal chest x-ray, normal lungs on auscultation.  Diffuse abdominal tenderness but without any focal findings.  He has not been having any diarrhea and no vomiting, LFTs are normal, lipase is normal.  He does have a very mild leukocytosis with left shift but I suspect this is more from volume depletion.  I am providing a small fluid bolus and admitted to the hospitalist for further management of his acute on chronic renal failure and failure to thrive.   ____________________________________________  FINAL CLINICAL IMPRESSION(S) / ED DIAGNOSES  Final diagnoses:  Acute renal failure superimposed on chronic kidney disease, unspecified CKD stage, unspecified acute renal failure type ([place])  Weakness  Failure to thrive in adult     MEDICATIONS GIVEN DURING THIS VISIT:  Medications  sodium chloride 0.9 % bolus 500 mL (500 mLs Intravenous New Bag/Given 11/19/16 1301)     NEW OUTPATIENT MEDICATIONS STARTED DURING THIS VISIT:  New Prescriptions   No medications on file    Modified Medications   No medications on file    Discontinued Medications   No medications on file     Note:  This document was prepared using Dragon voice recognition software and may include unintentional dictation errors.    Hinda Kehr, MD 11/19/16 [phone]

## 2016-11-19 NOTE — ED Notes (Signed)
Pt drank cup of water provided earlier, given sprite at this time.

## 2016-11-19 NOTE — ED Triage Notes (Signed)
Pt presents to ED via [facility] from home for weakness. Pt states he was at [facility] for about 10 days because his wife couldn't take care of him because he couldn't walk. Pt states he has been this way for some time now. Pt is alert and oriented x 4, talking in complete sentences. Pt arrived on 4L nasal cannula, EMS stated it was difficult to get oxygen sat, pt is 100% RA. Pt wears 2L nasal cannula at night sometimes. EMS states pt took all AM medications including BP meds and with FD BP was in 70's. EMS gave 559ml normal saline. Pt has had poor intake, states he doesn't feel like eating, and poor output, urine is concentrated per EMS. PT states he had OJ and bagel for breakfast, CBG 226.   Hx HTN, DM, gout, prostate CA. Pt is DNR.

## 2016-11-19 NOTE — ED Notes (Signed)
Dr. Kalisetti at bedside. 

## 2016-11-19 NOTE — ED Notes (Signed)
Pt given applesauce to eat. Pt sat up in bed.

## 2016-11-19 NOTE — Progress Notes (Signed)
Palliative Medicine consult noted. Due to high referral volume, there may be a delay seeing this patient. Please call the Palliative Medicine Team office at [phone] if recommendations are needed in the interim.  Thank you for inviting [place] to see this patient.  Marjie Skiff Devaunte Gasparini, RN, BSN, [facility] 11/19/2016 2:16 PM Cell [phone] 8:00-4:00 Monday-Friday Office [phone]

## 2016-11-20 DIAGNOSIS — N179 Acute kidney failure, unspecified: Secondary | ICD-10-CM

## 2016-11-20 DIAGNOSIS — L8990 Pressure ulcer of unspecified site, unspecified stage: Secondary | ICD-10-CM | POA: Insufficient documentation

## 2016-11-20 DIAGNOSIS — N189 Chronic kidney disease, unspecified: Secondary | ICD-10-CM

## 2016-11-20 DIAGNOSIS — Z7189 Other specified counseling: Secondary | ICD-10-CM

## 2016-11-20 DIAGNOSIS — Z515 Encounter for palliative care: Secondary | ICD-10-CM

## 2016-11-20 LAB — BASIC METABOLIC PANEL
ANION GAP: 7 (ref 5–15)
BUN: 79 mg/dL — ABNORMAL HIGH (ref 6–20)
CHLORIDE: 107 mmol/L (ref 101–111)
CO2: 31 mmol/L (ref 22–32)
CREATININE: 3.96 mg/dL — AB (ref 0.61–1.24)
Calcium: 8 mg/dL — ABNORMAL LOW (ref 8.9–10.3)
GFR calc non Af Amer: 12 mL/min — ABNORMAL LOW (ref >60)
GFR, EST AFRICAN AMERICAN: 14 mL/min — AB (ref >60)
Glucose, Bld: 125 mg/dL — ABNORMAL HIGH (ref 65–99)
Potassium: 3.1 mmol/L — ABNORMAL LOW (ref 3.5–5.1)
Sodium: 145 mmol/L (ref 135–145)

## 2016-11-20 LAB — CBC
HEMATOCRIT: 36.4 % — AB (ref 40.0–52.0)
HEMOGLOBIN: 12 g/dL — AB (ref 13.0–18.0)
MCH: 31.2 pg (ref 26.0–34.0)
MCHC: 33.1 g/dL (ref 32.0–36.0)
MCV: 94.1 fL (ref 80.0–100.0)
Platelets: 202 10*3/uL (ref 150–440)
RBC: 3.86 MIL/uL — AB (ref 4.40–5.90)
RDW: 15.1 % — ABNORMAL HIGH (ref 11.5–14.5)
WBC: 10.5 10*3/uL (ref 3.8–10.6)

## 2016-11-20 LAB — PROTEIN / CREATININE RATIO, URINE
CREATININE, URINE: 101 mg/dL
PROTEIN CREATININE RATIO: 0.29 mg/mg{creat} — AB (ref 0.00–0.15)
TOTAL PROTEIN, URINE: 29 mg/dL

## 2016-11-20 LAB — GLUCOSE, CAPILLARY
GLUCOSE-CAPILLARY: 96 mg/dL (ref 65–99)
Glucose-Capillary: 162 mg/dL — ABNORMAL HIGH (ref 65–99)
Glucose-Capillary: 148 mg/dL — ABNORMAL HIGH (ref 65–99)
Glucose-Capillary: 122 mg/dL — ABNORMAL HIGH (ref 65–99)

## 2016-11-20 MED ORDER — POTASSIUM CHLORIDE 20 MEQ PO PACK
40.0000 meq | PACK | Freq: Once | ORAL | Status: AC
  Administered 2016-11-20: 40 meq via ORAL
  Filled 2016-11-20: qty 2

## 2016-11-20 MED ORDER — HYDROCODONE-ACETAMINOPHEN 5-325 MG PO TABS
1.0000 | ORAL_TABLET | Freq: Four times a day (QID) | ORAL | Status: DC | PRN
  Administered 2016-11-20 – 2016-11-21 (×3): 1 via ORAL
  Filled 2016-11-20 (×3): qty 1

## 2016-11-20 NOTE — Consult Note (Signed)
[facility] CONSULT NOTE    Date: 11/20/2016                  Patient Name:  Dean Garcia  MRN: [id number]  DOB: [birth year]  Age / Sex: 82 y.o., male         PCP: Kirk Ruths., MD                 Service Requesting Consult: Dr. Tressia Miners                 Reason for Consult: Acute renal failure, CKD stage IV            History of Present Illness: Patient is a 82 y.o. male with a PMHx of bladder cancer, right bundle branch block, history of CVA, diabetes mellitus type 2, gout, hypertension, mitral valve prolapse, who was admitted to [facility] on 11/19/2016 for evaluation of weakness.  The patient is a resident of [facility] [place] [facility].  He presented here for increasing weakness.  We are asked to see him for evaluation and management of acute renal failure with chronic kidney disease stage IV.  The patient's baseline creatinine appears to be 3.0 from 10/22/16.  Upon admission creatinine was 4.51.  Creatinine has come down slightly to 3.96 now.  Renal ultrasound was performed which demonstrated known renal cysts.  There was no hydronephrosis noted. Patient states that he's had some discussions with his primary care physician regarding the potential for renal replacement therapy.  Up until now the patient has been undecided but it appears that he is open to a trial of this.     Medications: Outpatient medications: Prescriptions Prior to Admission  Medication Sig Dispense Refill Last Dose  . allopurinol (ZYLOPRIM) 100 MG tablet Take 100 mg by mouth daily.   11/19/2016 at 0800  . amLODipine (NORVASC) 5 MG tablet Take 2.5 mg by mouth daily.    11/19/2016 at 0800  . apixaban (ELIQUIS) 2.5 MG TABS tablet Take 2.5 mg by mouth 2 (two) times daily.    11/19/2016 at 0800  . cyanocobalamin (,VITAMIN B-12,) 1000 MCG/ML injection Inject 1,000 mcg into the muscle every 30 (thirty) days.    Past Month at 0800  . diltiazem (CARDIZEM CD) 240 MG 24 hr capsule Take 1 capsule  (240 mg total) by mouth daily. 30 capsule 0 11/19/2016 at 0800  . fexofenadine (ALLEGRA) 180 MG tablet Take 180 mg by mouth as needed for allergies or rhinitis.   prn at prn  . insulin lispro protamine-lispro (HUMALOG 75/25 MIX) (75-25) 100 UNIT/ML SUSP injection Inject 10 Units into the skin daily before supper.   11/19/2016 at 0800  . losartan (COZAAR) 50 MG tablet Take 50 mg by mouth daily.   11/19/2016 at 0800  . metolazone (ZAROXOLYN) 2.5 MG tablet Take 2.5 mg by mouth every other day.    Past Week at 0800  . mirtazapine (REMERON) 15 MG tablet Take 1 tablet (15 mg total) by mouth at bedtime. 30 tablet 0 11/17/2016 at 2000  . omeprazole (PRILOSEC) 20 MG capsule Take 20 mg by mouth daily.    11/19/2016 at 0800  . ondansetron (ZOFRAN-ODT) 4 MG disintegrating tablet Take 4 mg by mouth every 8 (eight) hours as needed for nausea or vomiting.    prn at prn  . potassium chloride (K-DUR) 10 MEQ tablet Take 10 mEq by mouth daily.   11/19/2016 at 0800  . primidone (MYSOLINE) 50 MG tablet Take 50 mg by mouth  2 (two) times daily.    11/19/2016 at 0800  . propranolol (INDERAL) 10 MG tablet Take 10 mg by mouth 2 (two) times daily.    11/19/2016 at 0800  . ranitidine (ZANTAC) 150 MG tablet Take 300 mg by mouth every evening.    11/17/2016 at 1800  . sitaGLIPtin (JANUVIA) 25 MG tablet Take 25 mg by mouth daily.    11/19/2016 at 0800  . torsemide (DEMADEX) 20 MG tablet Take 40 mg by mouth daily.   11/19/2016 at 0800  . atorvastatin (LIPITOR) 40 MG tablet Take 40 mg by mouth at bedtime.   unknown at unknown  . glucose blood (ACCU-CHEK COMPACT PLUS) test strip    unknown at unknown  . Insulin Pen Needle (B-D ULTRAFINE III SHORT PEN) 31G X 8 MM MISC    unknown at unknown    Current medications: Current Facility-Administered Medications  Medication Dose Route Frequency Provider Last Rate Last Dose  . 0.9 %  sodium chloride infusion   Intravenous Continuous Gladstone Lighter, MD 75 mL/hr at 11/20/16 1209    .  acetaminophen (TYLENOL) tablet 650 mg  650 mg Oral Q6H PRN Gladstone Lighter, MD   650 mg at 11/19/16 2234   Or  . acetaminophen (TYLENOL) suppository 650 mg  650 mg Rectal Q6H PRN Gladstone Lighter, MD      . allopurinol (ZYLOPRIM) tablet 100 mg  100 mg Oral Daily Gladstone Lighter, MD   100 mg at 11/20/16 1208  . amLODipine (NORVASC) tablet 5 mg  5 mg Oral Daily Gladstone Lighter, MD   5 mg at 11/20/16 1138  . apixaban (ELIQUIS) tablet 2.5 mg  2.5 mg Oral BID Gladstone Lighter, MD   2.5 mg at 11/20/16 1138  . atorvastatin (LIPITOR) tablet 40 mg  40 mg Oral QHS Gladstone Lighter, MD      . cyanocobalamin ((VITAMIN B-12)) injection 1,000 mcg  1,000 mcg Intramuscular Q30 days Gladstone Lighter, MD   1,000 mcg at 11/20/16 1208  . diltiazem (CARDIZEM CD) 24 hr capsule 240 mg  240 mg Oral Daily Gladstone Lighter, MD   240 mg at 11/20/16 1139  . famotidine (PEPCID) tablet 20 mg  20 mg Oral QHS Gladstone Lighter, MD   20 mg at 11/19/16 2234  . HYDROcodone-acetaminophen (NORCO/VICODIN) 5-325 MG per tablet 1 tablet  1 tablet Oral Q6H PRN Lance Coon, MD   1 tablet at 11/20/16 1444  . insulin aspart (novoLOG) injection 0-5 Units  0-5 Units Subcutaneous QHS Gladstone Lighter, MD      . insulin aspart (novoLOG) injection 0-9 Units  0-9 Units Subcutaneous TID WC Gladstone Lighter, MD   1 Units at 11/20/16 1234  . insulin aspart protamine- aspart (NOVOLOG MIX 70/30) injection 5 Units  5 Units Subcutaneous BID WC Gladstone Lighter, MD   5 Units at 11/20/16 0830  . linagliptin (TRADJENTA) tablet 5 mg  5 mg Oral Daily Gladstone Lighter, MD   5 mg at 11/20/16 1138  . loratadine (CLARITIN) tablet 10 mg  10 mg Oral Daily Gladstone Lighter, MD   10 mg at 11/20/16 1142  . mirtazapine (REMERON) tablet 15 mg  15 mg Oral QHS Gladstone Lighter, MD   15 mg at 11/19/16 2234  . ondansetron (ZOFRAN) tablet 4 mg  4 mg Oral Q6H PRN Gladstone Lighter, MD       Or  . ondansetron (ZOFRAN) injection 4 mg  4 mg Intravenous Q6H PRN  Gladstone Lighter, MD      . pantoprazole (PROTONIX) EC  tablet 40 mg  40 mg Oral Daily Gladstone Lighter, MD   40 mg at 11/20/16 1207  . primidone (MYSOLINE) tablet 50 mg  50 mg Oral BID Gladstone Lighter, MD   50 mg at 11/20/16 1139  . propranolol (INDERAL) tablet 10 mg  10 mg Oral BID Gladstone Lighter, MD   10 mg at 11/20/16 1207  . sodium chloride flush (NS) 0.9 % injection 3 mL  3 mL Intravenous Q12H Gladstone Lighter, MD   3 mL at 11/19/16 2011      Allergies: Allergies  Allergen Reactions  . Demerol [Meperidine] Nausea And Vomiting    Nausea/vomiting      Past Medical History: Past Medical History:  Diagnosis Date  . Bladder cancer ([place])   . Bundle branch block, right   . Carotid stenosis   . Colon polyp   . CVA (cerebral infarction)   . Diabetes ([place])   . Gout   . High cholesterol   . Hypertension   . Mitral valve prolapse   . Prostate cancer ([place])   . Tremor      Past Surgical History: Past Surgical History:  Procedure Laterality Date  . APPENDECTOMY       Family History: Family History  Problem Relation Age of Onset  . Hypertension Father   . Stroke Father   . Diabetes Mother      Social History: Social History   Social History  . Marital status: Married    Spouse name: N/A  . Number of children: N/A  . Years of education: N/A   Occupational History  . Not on file.   Social History Main Topics  . Smoking status: Former Smoker    Types: Pipe    Quit date: 10/29/2010  . Smokeless tobacco: Never Used  . Alcohol use Yes  . Drug use: No  . Sexual activity: Not on file   Other Topics Concern  . Not on file   Social History Narrative  . No narrative on file     Review of Systems: Review of Systems  Constitutional: Positive for malaise/fatigue and weight loss. Negative for chills and fever.  HENT: Negative for congestion, hearing loss and nosebleeds.   Eyes: Negative for blurred vision and double vision.  Respiratory: Negative for  cough, hemoptysis and sputum production.   Cardiovascular: Positive for leg swelling. Negative for chest pain and palpitations.  Gastrointestinal: Negative for heartburn, nausea and vomiting.  Genitourinary: Negative for dysuria and urgency.  Musculoskeletal: Negative for myalgias.  Skin: Negative for itching and rash.  Neurological: Positive for weakness. Negative for dizziness.  Endo/Heme/Allergies: Negative for polydipsia. Does not bruise/bleed easily.  Psychiatric/Behavioral: Negative for depression. The patient is not nervous/anxious.      Vital Signs: Blood pressure 121/60, pulse (!) 58, temperature 98.2 F (36.8 C), temperature source Oral, resp. rate 14, height 6' 1"  (1.854 m), weight 75.3 kg (166 lb), SpO2 99 %.  Weight trends: Filed Weights   11/19/16 1127  Weight: 75.3 kg (166 lb)    Physical Exam: General: NAD,   Head: Normocephalic, atraumatic.  Eyes: Anicteric, EOMI  Nose: Mucous membranes moist, not inflammed, nonerythematous.  Throat: Oropharynx nonerythematous, no exudate appreciated.   Neck: Supple, trachea midline.  Lungs:  Normal respiratory effort. Clear to auscultation BL without crackles or wheezes.  Heart: RRR. S1 and S2 normal without gallop, murmur, or rubs.  Abdomen:  BS normoactive. Soft, Nondistended, non-tender.  No masses or organomegaly.  Extremities: No pretibial edema.  Neurologic: A&O  X3, Motor strength is 5/5 in the all 4 extremities  Skin: No visible rashes, scars.    Lab results: Basic Metabolic Panel:  Recent Labs Lab 11/19/16 1129 11/20/16 0431  NA 146* 145  K 3.7 3.1*  CL 102 107  CO2 31 31  GLUCOSE 189* 125*  BUN 85* 79*  CREATININE 4.51* 3.96*  CALCIUM 8.7* 8.0*  MG 2.2  --     Liver Function Tests:  Recent Labs Lab 11/19/16 1129  AST 25  ALT 11*  ALKPHOS 61  BILITOT 0.1*  PROT 6.6  ALBUMIN 3.0*   No results for input(s): LIPASE, AMYLASE in the last 168 hours. No results for input(s): AMMONIA in the last  168 hours.  CBC:  Recent Labs Lab 11/19/16 1129 11/20/16 0431  WBC 11.9* 10.5  NEUTROABS 10.3*  --   HGB 12.7* 12.0*  HCT 39.0* 36.4*  MCV 94.3 94.1  PLT 241 202    Cardiac Enzymes:  Recent Labs Lab 11/19/16 1129  TROPONINI 0.03*    BNP: Invalid input(s): POCBNP  CBG:  Recent Labs Lab 11/19/16 1708 11/19/16 2155 11/20/16 0810 11/20/16 1214  GLUCAP 139* 121* 162* 148*    Microbiology: Results for orders placed or performed during the hospital encounter of 03/02/15  Culture, blood (single)     Status: None   Collection Time: 03/02/15  4:25 PM  Result Value Ref Range Status   Micro Text Report   Final       COMMENT                   NO GROWTH AEROBICALLY/ANAEROBICALLY IN 5 DAYS   ANTIBIOTIC                                                      Culture, blood (single)     Status: None   Collection Time: 03/02/15  4:42 PM  Result Value Ref Range Status   Micro Text Report   Final       COMMENT                   NO GROWTH AEROBICALLY/ANAEROBICALLY IN 5 DAYS   ANTIBIOTIC                                                        Coagulation Studies: No results for input(s): LABPROT, INR in the last 72 hours.  Urinalysis:  Recent Labs  11/19/16 1521  COLORURINE YELLOW*  LABSPEC 1.013  PHURINE 5.0  GLUCOSEU NEGATIVE  HGBUR NEGATIVE  BILIRUBINUR NEGATIVE  KETONESUR NEGATIVE  PROTEINUR NEGATIVE  NITRITE NEGATIVE  LEUKOCYTESUR NEGATIVE      Imaging: Dg Chest 2 View  Result Date: 11/19/2016 CLINICAL DATA:  Shortness of Breath EXAM: CHEST  2 VIEW COMPARISON:  October 22, 2016 FINDINGS: There is a calcified granuloma in the left upper lobe. There is no appreciable edema or consolidation. Heart size and pulmonary vascularity are normal. There is a sizable hiatal type hernia. No adenopathy. No bone lesions. IMPRESSION: Sizable hiatal hernia. Calcified granuloma left upper lobe. No edema or consolidation. Electronically Signed   By: Lowella Grip  III M.D.  On: 11/19/2016 12:16   US Renal  Result Date: 11/19/2016 CLINICAL DATA:  Acute renal failure EXAM: RENAL / URINARY TRACT ULTRASOUND COMPLETE COMPARISON:  Abdominal and pelvic CT scan of March 02, 2015 FINDINGS: Right Kidney: Length: 10.6 cm. The renal cortical echotexture remains lower than that of the adjacent liver. There are multiple cortical cysts with the largest rising from the lower pole measuring 5.5 x 5.3 x 6.3 cm. There is no hydronephrosis. Left Kidney: Length: 10.7 cm. The renal cortical echotexture is similar to that on the right. There are multiple cortical cysts. The largest lies in the upper pole measuring 4.1 x 3.3 x 3.8 cm. There is no hydronephrosis. Bladder: The urinary bladder was not visualized due to the presence of Foley catheter. IMPRESSION: Multiple known cysts in the kidneys. No hydronephrosis nor definite stones. The renal cortical echotexture remains normal. The urinary bladder was decompressed by a Foley catheter. Electronically Signed   By: David  Martinique M.D.   On: 11/19/2016 16:14      Assessment & Plan: Pt is a 82 y.o. male with a PMHx of bladder cancer, right bundle branch block, history of CVA, diabetes mellitus type 2, gout, hypertension, mitral valve prolapse, who was admitted to [facility] on 11/19/2016 for evaluation of weakness.   1.  Acute renal failure/chronic kidney disease stage IV baseline creatinine 3.0.  The patient had a creatinine of 3.0 in November.  At that time EGFR was 17.  Renal function appears to be worse now with a creatinine of 3.96 and EGFR of 12.  Renal ultrasound was negative for hydronephrosis.  Continue IV fluid hydration for now.  If this does not improve his underlying renal function we may need to consider renal placement therapy which the patient now appears to be open to.  Continue to monitor renal function trend closely.  Check SPEP and UPEP as well.  2.  Anemia chronic kidney disease.  Hemoglobin currently 12.0.  No indication  for Procrit at the moment.  Check SPEP and UPEP as above.  3.  Hypokalemia.  Serum potassium noted to be 3.1.  Patient given potassium chloride earlier today.  Followup potassium level tomorrow.  4.  Thanks for consultation.  Further plan as patient progresses.

## 2016-11-20 NOTE — Progress Notes (Signed)
[place] responded to a request for conversation and prayer. Pt was awake and alert but seemed troubled. Pt wanted to discus his views on God, heaven, and hell. CH provided empathetic listening and offered prayer. Pt seemed more at peace. CH is available for follow up as needed.    11/20/16 1300  Clinical Encounter Type  Visited With Patient  Visit Type Initial;Spiritual support  Referral From Chaplain  Consult/Referral To Chaplain  Spiritual Encounters  Spiritual Needs Prayer

## 2016-11-20 NOTE — Progress Notes (Signed)
[place] at [facility]                                                                                                                                                                                  Patient Demographics   Dean Garcia, is a 82 y.o. male, DOB - 1928/10/15, [id number]  Admit date - 11/19/2016   Admitting Physician Gladstone Lighter, MD  Outpatient Primary MD for the patient is Kirk Ruths., MD   LOS - 1  Subjective: Patient's complains of feeling weak denies any chest pain or shortness of breath complains of lack of appetite    Review of Systems:   CONSTITUTIONAL: No documented fever. Positive fatigue, positive weakness. No weight gain, no weight loss.  EYES: No blurry or double vision.  ENT: No tinnitus. No postnasal drip. No redness of the oropharynx.  RESPIRATORY: No cough, no wheeze, no hemoptysis. No dyspnea.  CARDIOVASCULAR: No chest pain. No orthopnea. No palpitations. No syncope.  GASTROINTESTINAL: No nausea, no vomiting or diarrhea. No abdominal pain. No melena or hematochezia.  GENITOURINARY: No dysuria or hematuria.  ENDOCRINE: No polyuria or nocturia. No heat or cold intolerance.  HEMATOLOGY: No anemia. No bruising. No bleeding.  INTEGUMENTARY: No rashes. No lesions.  MUSCULOSKELETAL: No arthritis. No swelling. No gout.  NEUROLOGIC: No numbness, tingling, or ataxia. No seizure-type activity.  PSYCHIATRIC: No anxiety. No insomnia. No ADD.    Vitals:   Vitals:   11/19/16 1830 11/19/16 1912 11/20/16 0424 11/20/16 0839  BP: 119/74 (!) 170/65 (!) 152/45 121/60  Pulse: 71 62 (!) 50 (!) 58  Resp: (!) 23 20 16 14   Temp:  98.1 F (36.7 C) 98.2 F (36.8 C)   TempSrc:  Oral Oral   SpO2: 96% 100% 100% 99%  Weight:      Height:        Wt Readings from Last 3 Encounters:  11/19/16 166 lb (75.3 kg)  10/22/16 165 lb (74.8 kg)  04/10/16 168 lb 9.6 oz (76.5 kg)     Intake/Output Summary (Last 24 hours) at  11/20/16 1526 Last data filed at 11/20/16 1406  Gross per 24 hour  Intake             1168 ml  Output              600 ml  Net              568 ml    Physical Exam:   GENERAL: Pleasant-appearing in no apparent distress.  HEAD, EYES, EARS, NOSE AND THROAT: Atraumatic, normocephalic. Extraocular muscles are intact. Pupils equal and reactive to light. Sclerae anicteric. No conjunctival injection. No oro-pharyngeal  erythema.  NECK: Supple. There is no jugular venous distention. No bruits, no lymphadenopathy, no thyromegaly.  HEART: Regular rate and rhythm,. No murmurs, no rubs, no clicks.  LUNGS: Clear to auscultation bilaterally. No rales or rhonchi. No wheezes.  ABDOMEN: Soft, flat, nontender, nondistended. Has good bowel sounds. No hepatosplenomegaly appreciated.  EXTREMITIES: No evidence of any cyanosis, clubbing, or peripheral edema.  +2 pedal and radial pulses bilaterally.  NEUROLOGIC: The patient is alert, awake, and oriented x3 with no focal motor or sensory deficits appreciated bilaterally.  SKIN: Moist and warm with no rashes appreciated.  Psych: Not anxious, depressed LN: No inguinal LN enlargement    Antibiotics   Anti-infectives    None      Medications   Scheduled Meds: . allopurinol  100 mg Oral Daily  . amLODipine  5 mg Oral Daily  . apixaban  2.5 mg Oral BID  . atorvastatin  40 mg Oral QHS  . cyanocobalamin  1,000 mcg Intramuscular Q30 days  . diltiazem  240 mg Oral Daily  . famotidine  20 mg Oral QHS  . insulin aspart  0-5 Units Subcutaneous QHS  . insulin aspart  0-9 Units Subcutaneous TID WC  . insulin aspart protamine- aspart  5 Units Subcutaneous BID WC  . linagliptin  5 mg Oral Daily  . loratadine  10 mg Oral Daily  . mirtazapine  15 mg Oral QHS  . pantoprazole  40 mg Oral Daily  . primidone  50 mg Oral BID  . propranolol  10 mg Oral BID  . sodium chloride flush  3 mL Intravenous Q12H   Continuous Infusions: . sodium chloride 75 mL/hr at 11/20/16  1209   PRN Meds:.acetaminophen **OR** acetaminophen, HYDROcodone-acetaminophen, ondansetron **OR** ondansetron (ZOFRAN) IV   Data Review:   Micro Results No results found for this or any previous visit (from the past 240 hour(s)).  Radiology Reports Dg Chest 2 View  Result Date: 11/19/2016 CLINICAL DATA:  Shortness of Breath EXAM: CHEST  2 VIEW COMPARISON:  October 22, 2016 FINDINGS: There is a calcified granuloma in the left upper lobe. There is no appreciable edema or consolidation. Heart size and pulmonary vascularity are normal. There is a sizable hiatal type hernia. No adenopathy. No bone lesions. IMPRESSION: Sizable hiatal hernia. Calcified granuloma left upper lobe. No edema or consolidation. Electronically Signed   By: Lowella Grip III M.D.   On: 11/19/2016 12:16   Dg Chest 2 View  Result Date: 10/22/2016 CLINICAL DATA:  Acute onset of feeling of impending doom. Initial encounter. EXAM: CHEST  2 VIEW COMPARISON:  CT of the chest performed 12/20/2015 FINDINGS: The lungs are well-aerated and clear. There is no evidence of focal opacification, pleural effusion or pneumothorax. Small calcified granulomata are noted bilaterally. The heart is borderline normal in size. No acute osseous abnormalities are seen. Chronic left-sided rib deformities are noted. A small to moderate hiatal hernia is noted. IMPRESSION: 1. No acute cardiopulmonary process seen. 2. Small to moderate hiatal hernia noted. Electronically Signed   By: Garald Balding M.D.   On: 10/22/2016 03:47   US Renal  Result Date: 11/19/2016 CLINICAL DATA:  Acute renal failure EXAM: RENAL / URINARY TRACT ULTRASOUND COMPLETE COMPARISON:  Abdominal and pelvic CT scan of March 02, 2015 FINDINGS: Right Kidney: Length: 10.6 cm. The renal cortical echotexture remains lower than that of the adjacent liver. There are multiple cortical cysts with the largest rising from the lower pole measuring 5.5 x 5.3 x 6.3 cm. There is  no  hydronephrosis. Left Kidney: Length: 10.7 cm. The renal cortical echotexture is similar to that on the right. There are multiple cortical cysts. The largest lies in the upper pole measuring 4.1 x 3.3 x 3.8 cm. There is no hydronephrosis. Bladder: The urinary bladder was not visualized due to the presence of Foley catheter. IMPRESSION: Multiple known cysts in the kidneys. No hydronephrosis nor definite stones. The renal cortical echotexture remains normal. The urinary bladder was decompressed by a Foley catheter. Electronically Signed   By: David  Martinique M.D.   On: 11/19/2016 16:14     CBC  Recent Labs Lab 11/19/16 1129 11/20/16 0431  WBC 11.9* 10.5  HGB 12.7* 12.0*  HCT 39.0* 36.4*  PLT 241 202  MCV 94.3 94.1  MCH 30.7 31.2  MCHC 32.6 33.1  RDW 15.2* 15.1*  LYMPHSABS 0.6*  --   MONOABS 0.9  --   EOSABS 0.1  --   BASOSABS 0.0  --     Chemistries   Recent Labs Lab 11/19/16 1129 11/20/16 0431  NA 146* 145  K 3.7 3.1*  CL 102 107  CO2 31 31  GLUCOSE 189* 125*  BUN 85* 79*  CREATININE 4.51* 3.96*  CALCIUM 8.7* 8.0*  MG 2.2  --   AST 25  --   ALT 11*  --   ALKPHOS 61  --   BILITOT 0.1*  --    ------------------------------------------------------------------------------------------------------------------ estimated creatinine clearance is 14.3 mL/min (by C-G formula based on SCr of 3.96 mg/dL (H)). ------------------------------------------------------------------------------------------------------------------ No results for input(s): HGBA1C in the last 72 hours. ------------------------------------------------------------------------------------------------------------------ No results for input(s): CHOL, HDL, LDLCALC, TRIG, CHOLHDL, LDLDIRECT in the last 72 hours. ------------------------------------------------------------------------------------------------------------------ No results for input(s): TSH, T4TOTAL, T3FREE, THYROIDAB in the last 72 hours.  Invalid  input(s): FREET3 ------------------------------------------------------------------------------------------------------------------ No results for input(s): VITAMINB12, FOLATE, FERRITIN, TIBC, IRON, RETICCTPCT in the last 72 hours.  Coagulation profile No results for input(s): INR, PROTIME in the last 168 hours.  No results for input(s): DDIMER in the last 72 hours.  Cardiac Enzymes  Recent Labs Lab 11/19/16 1129  TROPONINI 0.03*   ------------------------------------------------------------------------------------------------------------------ Invalid input(s): POCBNP    Assessment & Plan   Dean Garcia  is a 82 y.o. male with a known history of Prostate cancer status post radiation, hypertension, insulin-dependent diabetes mellitus, history of stroke, atrial fibrillation on eliquis presents to hospital from [facility] independent living secondary to generalized weakness, decreased oral intake and inability to walk for the last few days.  #1 acute renal failure on CK D stage IV Improved with IV hydration Renal ultrasound reviewed Supportive care Nephrology discussed with the patient all possible dialysis in the future patient states she'll discuss with family  #2 benign essential tremors-at baseline. Continue propranolol and primidone  #3 failure to thrive-continue antidepressant Remeron. Palliative care consulted, add Megace to current regimen for appetite stimulant  #4 diabetes mellitus-continue 70/30 insulin twice a day and on Linagliptin here Add SSI  #5 Afib- chronic, rate controlled. Continue cardizem, continue eliquis for anti coagulation  #6 DVT prophylaxis-on eliquis      Code Status Orders        Start     Ordered   11/19/16 1901  Do not attempt resuscitation (DNR)  Continuous    Question Answer Comment  In the event of cardiac or respiratory ARREST Do not call a "code blue"   In the event of cardiac or respiratory ARREST Do not perform Intubation,  CPR, defibrillation or ACLS   In the  event of cardiac or respiratory ARREST Use medication by any route, position, wound care, and other measures to relive pain and suffering. May use oxygen, suction and manual treatment of airway obstruction as needed for comfort.      11/19/16 1900    Code Status History    Date Active Date Inactive Code Status Order ID Comments User Context   02/12/2016  8:30 PM 02/14/2016  6:23 PM DNR [id number]  Hillary Bow, MD ED    Advance Directive Documentation   Flowsheet Row Most Recent Value  Type of Advance Directive  Living will, Healthcare Power of Attorney  Pre-existing out of facility DNR order (yellow form or pink MOST form)  No data  "MOST" Form in Place?  No data           Consults  Nephrology  DVT Prophylaxis  eliquis  Lab Results  Component Value Date   PLT 202 11/20/2016     Time Spent in minutes   33min  Greater than 50% of time spent in care coordination and counseling patient regarding the condition and plan of care.   Dustin Flock M.D on 11/20/2016 at 3:26 PM  Between 7am to 6pm - Pager - [phone]  After 6pm go to [email or web address] - password [email or web address] [place] [employer] [facility]   Office  [phone]

## 2016-11-20 NOTE — Clinical Social Work Note (Signed)
Clinical Social Work Assessment  Patient Details  Name: Dean Garcia MRN: [id number] Date of Birth: Mar 21, 1928  Date of referral:  11/20/16               Reason for consult:  Facility Placement                Permission sought to share information with:  Family Supports, Chartered certified accountant granted to share information::  Yes, Verbal Permission Granted  Name::        Agency::     Relationship::     Contact Information:     Housing/Transportation Living arrangements for the past 2 months:  Charity fundraiser of Information:  Patient, Spouse Patient Interpreter Needed:  None Criminal Activity/Legal Involvement Pertinent to Current Situation/Hospitalization:  No - Comment as needed Significant Relationships:  Spouse Lives with:  Spouse Do you feel safe going back to the place where you live?  Yes Need for family participation in patient care:  Yes (Comment)  Care giving concerns:  Patient resides at [facility] independent living with his spouse.   Social Worker assessment / plan:  CSW asked by Unit Director to see patient regarding request to speak with someone about wanting to make sure they were aware that they live on the [employer]. CSW visited with patient and wife and introduced self/role and purpose of visit. Patient and wife were very pleasant but having to process a lot of information and bad news surrounding patient now having to decide upon dialysis. Patient was tearful at times and admitted to being fearful. As patient became tearful so did his wife. CSW took time to process with patient and wife regarding their situation. Patient and wife stated that they prefer patient to return to their independent living home on the [place] and not to go to the [employer] for [facility]. Patient stated he was recently at [facility] for rehab and he did not like it there. Patient's wife is willing to care for patient at home  and was wanting to discuss the possible equipment needs he may have. CSW provided supportive listening and counseling and informed patient and wife that the RN CM would speak with them regarding any needs for home at discharge.   Employment status:  Retired Forensic scientist:  Medicare PT Recommendations:  Not assessed at this time Information / Referral to community resources:     Patient/Family's Response to care:  Patient and wife expressed appreciation for CSW visit.  Patient/Family's Understanding of and Emotional Response to Diagnosis, Current Treatment, and Prognosis:  Patient and wife are continuing to process the news regarding the need for dialysis as this is a difficult transition for patient and wife.   Emotional Assessment Appearance:  Appears stated age Attitude/Demeanor/Rapport:  Apprehensive, Grieving Affect (typically observed):  Accepting, Afraid/Fearful, Apprehensive, Appropriate, Pleasant, Depressed, Overwhelmed, Tearful/Crying Orientation:  Oriented to Self, Oriented to Place, Oriented to  Time, Oriented to Situation Alcohol / Substance use:  Not Applicable Psych involvement (Current and /or in the community):  No (Comment)  Discharge Needs  Concerns to be addressed:  Care Coordination Readmission within the last 30 days:  No Current discharge risk:  None Barriers to Discharge:  No Barriers Identified   Shela Leff, LCSW 11/20/2016, 2:39 PM

## 2016-11-20 NOTE — Consult Note (Signed)
Consultation Note Date: 11/20/2016   Patient Name: Dean Garcia  DOB: 04/20/1928  MRN: [id number]  Age / Sex: 82 y.o., male  PCP: Kirk Ruths, MD Referring Physician: Dustin Flock, MD  Reason for Consultation: Establishing goals of care  HPI/Patient Profile: 81 y.o. male  with past medical history of tremors, hypertension, high cholesterol, gout, diabetes, CVA, carotid stenosis, prostate and bladder cancer s/p radiation, and CKD IV admitted on 11/19/2016 with weakness and decreased oral intake from [facility] independent living. Due to weakness, he was transitioned to [facility] for a few days. Patient with acute on chronic renal failure-receiving IVF. Patient and wife discussing starting dialysis. Palliative medicine consultation for goals of care.         Clinical Assessment and Goals of Care: Met patient and wife, Izora Gala at bedside. Introduced palliative care and the support we will provide during hospitalization. From the beginning of the conversation, wife tells me they have been talking about dialysis and have decided to start dialysis. She tells me he has had CKD IV and his primary care doctor has been monitoring kidney functions, so "this is not a complete shock to them." Mr. Ikard tells me he wants to "try dialysis and see how it goes."    Educated them on ESRD and what dialysis will look like on a weekly basis. They understand that dialysis is a form of life support and will be keeping him alive. He states "I want to live as long as I possibly can." Encouraged them to continue conversations regarding at what point dialysis would not give him quality of life and be more harm than good to him.   Patient and wife tell me they have no regrets in life and have checked everything off of their bucket list. 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 They traveled around the country in an RV and  also bought a [place]. They have been married for 37 years. He tells me he is not a spiritual person. He seems fearful of the unknown and if he will be able to tolerate dialysis.   Spoke to wife alone after the conversation. She is not expecting much and seems to understand he will most likely be exhausted and may not even tolerate dialysis. Knowing this, she still tells me she respects his decision with wanting to try dialysis. She is agreeable with palliative services to follow outpatient and understands hospice services that will be available to them if he does not tolerate dialysis and declines. Answered questions and offered support. Hard choices copy given.    SUMMARY OF RECOMMENDATIONS    DNR/DNI  Patient and wife have decided to move forward with starting dialysis.   They are agreeable to have palliative services follow outpatient.   PMT will continue to shadow chart and provide support to patient and family during hospitalization.  Code Status/Advance Care Planning:  DNR   Symptom Management:   Per attending  Palliative Prophylaxis:   Aspiration, Frequent Pain Assessment and Turn Reposition  Additional Recommendations (Limitations, Scope, Preferences):  Full Scope Treatment-except DNR/DNI  Psycho-social/Spiritual:   Desire for further Chaplaincy support:no  Additional Recommendations: Caregiving  Support/Resources  Prognosis:   Unable to determine  Discharge Planning: To Be Determined      Primary Diagnoses: Present on Admission: . ARF (acute renal failure) ([place])   I have reviewed the medical record, interviewed the patient and family, and examined the patient. The following aspects are pertinent.  Past Medical History:  Diagnosis Date  . Bladder cancer ([place])   . Bundle branch block, right   . Carotid stenosis   . Colon polyp   . CVA (cerebral infarction)   . Diabetes ([place])   . Gout   . High cholesterol   . Hypertension   . Mitral valve prolapse    . Prostate cancer ([place])   . Tremor    Social History   Social History  . Marital status: Married    Spouse name: N/A  . Number of children: N/A  . Years of education: N/A   Social History Main Topics  . Smoking status: Former Smoker    Types: Pipe    Quit date: 10/29/2010  . Smokeless tobacco: Never Used  . Alcohol use Yes  . Drug use: No  . Sexual activity: Not Asked   Other Topics Concern  . None   Social History Narrative  . None   Family History  Problem Relation Age of Onset  . Hypertension Father   . Stroke Father   . Diabetes Mother    Scheduled Meds: . allopurinol  100 mg Oral Daily  . amLODipine  5 mg Oral Daily  . apixaban  2.5 mg Oral BID  . atorvastatin  40 mg Oral QHS  . cyanocobalamin  1,000 mcg Intramuscular Q30 days  . diltiazem  240 mg Oral Daily  . famotidine  20 mg Oral QHS  . insulin aspart  0-5 Units Subcutaneous QHS  . insulin aspart  0-9 Units Subcutaneous TID WC  . insulin aspart protamine- aspart  5 Units Subcutaneous BID WC  . linagliptin  5 mg Oral Daily  . loratadine  10 mg Oral Daily  . mirtazapine  15 mg Oral QHS  . pantoprazole  40 mg Oral Daily  . primidone  50 mg Oral BID  . propranolol  10 mg Oral BID  . sodium chloride flush  3 mL Intravenous Q12H   Continuous Infusions: . sodium chloride 75 mL/hr at 11/20/16 1209   PRN Meds:.acetaminophen **OR** acetaminophen, HYDROcodone-acetaminophen, ondansetron **OR** ondansetron (ZOFRAN) IV Medications Prior to Admission:  Prior to Admission medications   Medication Sig Start Date End Date Taking? Authorizing Provider  allopurinol (ZYLOPRIM) 100 MG tablet Take 100 mg by mouth daily.   Yes Historical Provider, MD  amLODipine (NORVASC) 5 MG tablet Take 2.5 mg by mouth daily.    Yes Historical Provider, MD  apixaban (ELIQUIS) 2.5 MG TABS tablet Take 2.5 mg by mouth 2 (two) times daily.  02/21/16  Yes Historical Provider, MD  cyanocobalamin (,VITAMIN B-12,) 1000 MCG/ML injection  Inject 1,000 mcg into the muscle every 30 (thirty) days.  10/31/15  Yes Historical Provider, MD  diltiazem (CARDIZEM CD) 240 MG 24 hr capsule Take 1 capsule (240 mg total) by mouth daily. 02/14/16  Yes Lytle Butte, MD  fexofenadine (ALLEGRA) 180 MG tablet Take 180 mg by mouth as needed for allergies or rhinitis.   Yes Historical Provider, MD  insulin lispro protamine-lispro (HUMALOG 75/25 MIX) (75-25) 100 UNIT/ML  SUSP injection Inject 10 Units into the skin daily before supper.   Yes Historical Provider, MD  losartan (COZAAR) 50 MG tablet Take 50 mg by mouth daily.   Yes Historical Provider, MD  metolazone (ZAROXOLYN) 2.5 MG tablet Take 2.5 mg by mouth every other day.    Yes Historical Provider, MD  mirtazapine (REMERON) 15 MG tablet Take 1 tablet (15 mg total) by mouth at bedtime. 10/22/16  Yes Gonzella Lex, MD  omeprazole (PRILOSEC) 20 MG capsule Take 20 mg by mouth daily.  03/09/16 03/09/17 Yes Historical Provider, MD  ondansetron (ZOFRAN-ODT) 4 MG disintegrating tablet Take 4 mg by mouth every 8 (eight) hours as needed for nausea or vomiting.    Yes Historical Provider, MD  potassium chloride (K-DUR) 10 MEQ tablet Take 10 mEq by mouth daily.   Yes Historical Provider, MD  primidone (MYSOLINE) 50 MG tablet Take 50 mg by mouth 2 (two) times daily.    Yes Historical Provider, MD  propranolol (INDERAL) 10 MG tablet Take 10 mg by mouth 2 (two) times daily.    Yes Historical Provider, MD  ranitidine (ZANTAC) 150 MG tablet Take 300 mg by mouth every evening.    Yes Historical Provider, MD  sitaGLIPtin (JANUVIA) 25 MG tablet Take 25 mg by mouth daily.    Yes Historical Provider, MD  torsemide (DEMADEX) 20 MG tablet Take 40 mg by mouth daily.   Yes Historical Provider, MD  atorvastatin (LIPITOR) 40 MG tablet Take 40 mg by mouth at bedtime.    Historical Provider, MD  glucose blood (ACCU-CHEK COMPACT PLUS) test strip  12/07/15   Historical Provider, MD  Insulin Pen Needle (B-D ULTRAFINE III SHORT PEN) 31G X  8 MM MISC  04/28/14   Historical Provider, MD   Allergies  Allergen Reactions  . Demerol [Meperidine] Nausea And Vomiting    Nausea/vomiting   Review of Systems  Constitutional: Positive for activity change, appetite change and fatigue.  Gastrointestinal: Positive for diarrhea.  Neurological: Positive for tremors and weakness.   Physical Exam  Constitutional: He is oriented to person, place, and time. He is cooperative. He appears ill.  Cardiovascular: Regular rhythm and normal heart sounds.   Pulmonary/Chest: Effort normal. He has decreased breath sounds.  Abdominal: Soft. Bowel sounds are normal.  Neurological: He is alert and oriented to person, place, and time.  Skin: Skin is warm and dry. There is pallor.  Psychiatric: He has a normal mood and affect. His speech is normal and behavior is normal. Cognition and memory are normal.  Nursing note and vitals reviewed.  Vital Signs: BP 121/60   Pulse (!) 58   Temp 98.2 F (36.8 C) (Oral)   Resp 14   Ht 6' 1"  (1.854 m)   Wt 75.3 kg (166 lb)   SpO2 99%   BMI 21.90 kg/m  Pain Assessment: 0-10   Pain Score: 0-No pain  SpO2: SpO2: 99 % O2 Device:SpO2: 99 % O2 Flow Rate: .O2 Flow Rate (L/min): 2 L/min  IO: Intake/output summary:   Intake/Output Summary (Last 24 hours) at 11/20/16 1416 Last data filed at 11/20/16 1406  Gross per 24 hour  Intake             1668 ml  Output              600 ml  Net             1068 ml    LBM: Last BM Date: 11/16/16 (per patient) Baseline  Weight: Weight: 75.3 kg (166 lb) Most recent weight: Weight: 75.3 kg (166 lb)     Palliative Assessment/Data: PPS 40%   Flowsheet Rows   Flowsheet Row Most Recent Value  Intake Tab  Referral Department  Hospitalist  Unit at Time of Referral  Med/Surg Unit  Palliative Care Primary Diagnosis  Nephrology  Date Notified  11/19/16  Palliative Care Type  New Palliative care  Reason for referral  Clarify Goals of Care  Date of Admission  11/19/16  Date  first seen by Palliative Care  11/20/16  # of days Palliative referral response time  1 Day(s)  # of days IP prior to Palliative referral  0  Clinical Assessment  Palliative Performance Scale Score  40%  Psychosocial & Spiritual Assessment  Palliative Care Outcomes  Patient/Family meeting held?  Yes  Who was at the meeting?  wife and patient  Palliative Care Outcomes  Clarified goals of care, Provided psychosocial or spiritual support, Counseled regarding hospice  Palliative Care follow-up planned  Yes, Facility      Time In: 1220 Time Out: 1330 Time Total: 58mn Greater than 50%  of this time was spent counseling and coordinating care related to the above assessment and plan.  Signed by:  MIhor Dow FNP-C Palliative Medicine Team  Phone: [phone]: [phone]  Please contact Palliative Medicine Team phone at [phone] questions and concerns.  For individual provider: See AShea Evans

## 2016-11-21 LAB — GLUCOSE, CAPILLARY
GLUCOSE-CAPILLARY: 192 mg/dL — AB (ref 65–99)
GLUCOSE-CAPILLARY: 64 mg/dL — AB (ref 65–99)
GLUCOSE-CAPILLARY: 91 mg/dL (ref 65–99)
Glucose-Capillary: 100 mg/dL — ABNORMAL HIGH (ref 65–99)
Glucose-Capillary: 124 mg/dL — ABNORMAL HIGH (ref 65–99)

## 2016-11-21 LAB — BASIC METABOLIC PANEL
ANION GAP: 5 (ref 5–15)
BUN: 66 mg/dL — ABNORMAL HIGH (ref 6–20)
CALCIUM: 7.5 mg/dL — AB (ref 8.9–10.3)
CHLORIDE: 111 mmol/L (ref 101–111)
CO2: 28 mmol/L (ref 22–32)
Creatinine, Ser: 2.94 mg/dL — ABNORMAL HIGH (ref 0.61–1.24)
GFR calc Af Amer: 21 mL/min — ABNORMAL LOW (ref >60)
GFR calc non Af Amer: 18 mL/min — ABNORMAL LOW (ref >60)
GLUCOSE: 114 mg/dL — AB (ref 65–99)
Potassium: 3.5 mmol/L (ref 3.5–5.1)
Sodium: 144 mmol/L (ref 135–145)

## 2016-11-21 NOTE — Progress Notes (Signed)
Notified Dr. Posey Pronto of low BP no new orders received. Blood glucose of 100 per MD do not give 5 units of 70/30 insulin

## 2016-11-21 NOTE — Progress Notes (Signed)
PT Cancellation Note  Patient Details Name: Dean Garcia MRN: [id number] DOB: 1928/07/29   Cancelled Treatment:    Reason Eval/Treat Not Completed: Patient declined, no reason specified. Chart reviewed. Attempted to see patient. Pt is laying on his right side with his eyes closed. Pt talks with therapist but will not open his eyes. Reports that he has a painful foot and just received a pain pill. He wants to sleep currently and does not feel like he can participate with physical therapy. Encouraged pt to work with therapy to help with DC planning but pt currently refuses. States "I might" work with physical therapy tomorrow. Will attempt PT evaluation on later date as pt will available and agreeable.  Lyndel Safe Bernhard Koskinen PT, DPT   Tishawn Friedhoff 11/21/2016, 3:53 PM

## 2016-11-21 NOTE — Progress Notes (Signed)
Per Dr. Posey Pronto discontinue order for propranolo as patient has had low pulse

## 2016-11-21 NOTE — Progress Notes (Signed)
[place] at [facility]                                                                                                                                                                                  Patient Demographics   Dean Garcia, is a 82 y.o. male, DOB - 02/08/1928, [id number]  Admit date - 11/19/2016   Admitting Physician Gladstone Lighter, MD  Outpatient Primary MD for the patient is Kirk Ruths., MD   LOS - 2  Subjective: Patient feeling better able to eat more he is questioning whether he needs to get dialyzed  Review of Systems:   CONSTITUTIONAL: No documented fever. Positive fatigue, positive weakness. No weight gain, no weight loss.  EYES: No blurry or double vision.  ENT: No tinnitus. No postnasal drip. No redness of the oropharynx.  RESPIRATORY: No cough, no wheeze, no hemoptysis. No dyspnea.  CARDIOVASCULAR: No chest pain. No orthopnea. No palpitations. No syncope.  GASTROINTESTINAL: No nausea, no vomiting or diarrhea. No abdominal pain. No melena or hematochezia.  GENITOURINARY: No dysuria or hematuria.  ENDOCRINE: No polyuria or nocturia. No heat or cold intolerance.  HEMATOLOGY: No anemia. No bruising. No bleeding.  INTEGUMENTARY: No rashes. No lesions.  MUSCULOSKELETAL: No arthritis. No swelling. No gout.  NEUROLOGIC: No numbness, tingling, or ataxia. No seizure-type activity.  PSYCHIATRIC: No anxiety. No insomnia. No ADD.    Vitals:   Vitals:   11/21/16 0635 11/21/16 0648 11/21/16 0738 11/21/16 0955  BP: (!) 115/37 (!) 112/33 (!) 113/47 (!) 114/57  Pulse: (!) 50 (!) 47 60 (!) 54  Resp:      Temp:      TempSrc:      SpO2:      Weight:      Height:        Wt Readings from Last 3 Encounters:  11/19/16 166 lb (75.3 kg)  10/22/16 165 lb (74.8 kg)  04/10/16 168 lb 9.6 oz (76.5 kg)     Intake/Output Summary (Last 24 hours) at 11/21/16 1526 Last data filed at 11/21/16 1243  Gross per 24 hour  Intake              2453 ml  Output              800 ml  Net             1653 ml    Physical Exam:   GENERAL: Pleasant-appearing in no apparent distress.  HEAD, EYES, EARS, NOSE AND THROAT: Atraumatic, normocephalic. Extraocular muscles are intact. Pupils equal and reactive to light. Sclerae anicteric. No conjunctival injection. No oro-pharyngeal erythema.  NECK: Supple. There is no jugular venous  distention. No bruits, no lymphadenopathy, no thyromegaly.  HEART: Regular rate and rhythm,. No murmurs, no rubs, no clicks.  LUNGS: Clear to auscultation bilaterally. No rales or rhonchi. No wheezes.  ABDOMEN: Soft, flat, nontender, nondistended. Has good bowel sounds. No hepatosplenomegaly appreciated.  EXTREMITIES: No evidence of any cyanosis, clubbing, or peripheral edema.  +2 pedal and radial pulses bilaterally.  NEUROLOGIC: The patient is alert, awake, and oriented x3 with no focal motor or sensory deficits appreciated bilaterally.  SKIN: Moist and warm with no rashes appreciated.  Psych: Not anxious, depressed LN: No inguinal LN enlargement    Antibiotics   Anti-infectives    None      Medications   Scheduled Meds: . allopurinol  100 mg Oral Daily  . amLODipine  5 mg Oral Daily  . apixaban  2.5 mg Oral BID  . atorvastatin  40 mg Oral QHS  . cyanocobalamin  1,000 mcg Intramuscular Q30 days  . diltiazem  240 mg Oral Daily  . famotidine  20 mg Oral QHS  . insulin aspart  0-5 Units Subcutaneous QHS  . insulin aspart  0-9 Units Subcutaneous TID WC  . insulin aspart protamine- aspart  5 Units Subcutaneous BID WC  . loratadine  10 mg Oral Daily  . mirtazapine  15 mg Oral QHS  . pantoprazole  40 mg Oral Daily  . primidone  50 mg Oral BID  . sodium chloride flush  3 mL Intravenous Q12H   Continuous Infusions: . sodium chloride 75 mL/hr at 11/21/16 0359   PRN Meds:.acetaminophen **OR** acetaminophen, ondansetron **OR** ondansetron (ZOFRAN) IV   Data Review:   Micro Results No results found  for this or any previous visit (from the past 240 hour(s)).  Radiology Reports Dg Chest 2 View  Result Date: 11/19/2016 CLINICAL DATA:  Shortness of Breath EXAM: CHEST  2 VIEW COMPARISON:  October 22, 2016 FINDINGS: There is a calcified granuloma in the left upper lobe. There is no appreciable edema or consolidation. Heart size and pulmonary vascularity are normal. There is a sizable hiatal type hernia. No adenopathy. No bone lesions. IMPRESSION: Sizable hiatal hernia. Calcified granuloma left upper lobe. No edema or consolidation. Electronically Signed   By: Lowella Grip III M.D.   On: 11/19/2016 12:16   US Renal  Result Date: 11/19/2016 CLINICAL DATA:  Acute renal failure EXAM: RENAL / URINARY TRACT ULTRASOUND COMPLETE COMPARISON:  Abdominal and pelvic CT scan of March 02, 2015 FINDINGS: Right Kidney: Length: 10.6 cm. The renal cortical echotexture remains lower than that of the adjacent liver. There are multiple cortical cysts with the largest rising from the lower pole measuring 5.5 x 5.3 x 6.3 cm. There is no hydronephrosis. Left Kidney: Length: 10.7 cm. The renal cortical echotexture is similar to that on the right. There are multiple cortical cysts. The largest lies in the upper pole measuring 4.1 x 3.3 x 3.8 cm. There is no hydronephrosis. Bladder: The urinary bladder was not visualized due to the presence of Foley catheter. IMPRESSION: Multiple known cysts in the kidneys. No hydronephrosis nor definite stones. The renal cortical echotexture remains normal. The urinary bladder was decompressed by a Foley catheter. Electronically Signed   By: David  Martinique M.D.   On: 11/19/2016 16:14     CBC  Recent Labs Lab 11/19/16 1129 11/20/16 0431  WBC 11.9* 10.5  HGB 12.7* 12.0*  HCT 39.0* 36.4*  PLT 241 202  MCV 94.3 94.1  MCH 30.7 31.2  MCHC 32.6 33.1  RDW 15.2*  15.1*  LYMPHSABS 0.6*  --   MONOABS 0.9  --   EOSABS 0.1  --   BASOSABS 0.0  --     Chemistries   Recent Labs Lab  11/19/16 1129 11/20/16 0431 11/21/16 0442  NA 146* 145 144  K 3.7 3.1* 3.5  CL 102 107 111  CO2 31 31 28   GLUCOSE 189* 125* 114*  BUN 85* 79* 66*  CREATININE 4.51* 3.96* 2.94*  CALCIUM 8.7* 8.0* 7.5*  MG 2.2  --   --   AST 25  --   --   ALT 11*  --   --   ALKPHOS 61  --   --   BILITOT 0.1*  --   --    ------------------------------------------------------------------------------------------------------------------ estimated creatinine clearance is 19.2 mL/min (by C-G formula based on SCr of 2.94 mg/dL (H)). ------------------------------------------------------------------------------------------------------------------ No results for input(s): HGBA1C in the last 72 hours. ------------------------------------------------------------------------------------------------------------------ No results for input(s): CHOL, HDL, LDLCALC, TRIG, CHOLHDL, LDLDIRECT in the last 72 hours. ------------------------------------------------------------------------------------------------------------------ No results for input(s): TSH, T4TOTAL, T3FREE, THYROIDAB in the last 72 hours.  Invalid input(s): FREET3 ------------------------------------------------------------------------------------------------------------------ No results for input(s): VITAMINB12, FOLATE, FERRITIN, TIBC, IRON, RETICCTPCT in the last 72 hours.  Coagulation profile No results for input(s): INR, PROTIME in the last 168 hours.  No results for input(s): DDIMER in the last 72 hours.  Cardiac Enzymes  Recent Labs Lab 11/19/16 1129  TROPONINI 0.03*   ------------------------------------------------------------------------------------------------------------------ Invalid input(s): POCBNP    Assessment & Plan   Tor Sammon  is a 82 y.o. male with a known history of Prostate cancer status post radiation, hypertension, insulin-dependent diabetes mellitus, history of stroke, atrial fibrillation on eliquis presents to  hospital from [facility] independent living secondary to generalized weakness, decreased oral intake and inability to walk for the last few days.  #1 acute renal failure on CK D stage IV Improved with IV hydration Renal ultrasound reviewed Supportive care Renal function continues to improve continue to monitor  #2 benign essential tremors-at baseline. Continue propranolol and primidone  #3 failure to thrive-continue antidepressant Remeron. Palliative care consulted, add Megace to current regimen for appetite stimulant  #4 diabetes mellitus-continue 70/30 insulin twice a day and on Linagliptin  hold today due to lobe blood sugar here Continue sliding scale  #5 Afib- chronic, rate controlled. Continue cardizem, continue eliquis for anti coagulation  #6 DVT prophylaxis-on eliquis      Code Status Orders        Start     Ordered   11/19/16 1901  Do not attempt resuscitation (DNR)  Continuous    Question Answer Comment  In the event of cardiac or respiratory ARREST Do not call a "code blue"   In the event of cardiac or respiratory ARREST Do not perform Intubation, CPR, defibrillation or ACLS   In the event of cardiac or respiratory ARREST Use medication by any route, position, wound care, and other measures to relive pain and suffering. May use oxygen, suction and manual treatment of airway obstruction as needed for comfort.      11/19/16 1900    Code Status History    Date Active Date Inactive Code Status Order ID Comments User Context   02/12/2016  8:30 PM 02/14/2016  6:23 PM DNR [id number]  Hillary Bow, MD ED    Advance Directive Documentation   Flowsheet Row Most Recent Value  Type of Advance Directive  Living will, Healthcare Power of Attorney  Pre-existing out of facility DNR order (yellow form or pink  MOST form)  No data  "MOST" Form in Place?  No data           Consults  Nephrology  DVT Prophylaxis  eliquis  Lab Results  Component Value Date   PLT 202  11/20/2016     Time Spent in minutes   59min  Greater than 50% of time spent in care coordination and counseling patient regarding the condition and plan of care.   Dustin Flock M.D on 11/21/2016 at 3:26 PM  Between 7am to 6pm - Pager - [phone]  After 6pm go to [email or web address] - password [email or web address] [facility] [facility]   Office  [phone]

## 2016-11-21 NOTE — Progress Notes (Addendum)
Notified Dr. Posey Pronto of low BP. Per MD do not give amlodepine and discontinue order for trajenta as well. Also okay to place order for PT to work with patient.

## 2016-11-21 NOTE — Progress Notes (Signed)
Pt was noted to have Low BP and HR upon morning vitals. Pt VS rechecked and BP and HR remained low. Dr. Marcille Blanco paged and spoken to on phone in regard to pt BP and HR. Dr. Marcille Blanco rounded on pt. No new orders at this point. Primary nurse to continue to monitor.

## 2016-11-21 NOTE — Progress Notes (Signed)
Notified Dr. Posey Pronto that patient had a bowel movement that had blood in it. No new orders received will continue to monitor

## 2016-11-21 NOTE — Progress Notes (Signed)
[facility]  ROUNDING NOTE   Subjective:  Renal function appears to have improved. BUN down to 66 and creatinine is currently 2.94. Patient's wife was at bedside today.   Objective:  Vital signs in last 24 hours:  Temp:  [98 F (36.7 C)-99.4 F (37.4 C)] 98 F (36.7 C) (12/20 1546) Pulse Rate:  [44-60] 59 (12/20 1546) Resp:  [18-20] 20 (12/20 1546) BP: (108-169)/(33-62) 169/62 (12/20 1546) SpO2:  [95 %-100 %] 100 % (12/20 1546)  Weight change:  Filed Weights   11/19/16 1127  Weight: 75.3 kg (166 lb)    Intake/Output: I/O last 3 completed shifts: In: 2847 [P.O.:660; I.V.:2187] Out: 400 [Urine:400]   Intake/Output this shift:  Total I/O In: 1053 [P.O.:120; I.V.:933] Out: 500 [Urine:500]  Physical Exam: General: No acute distress  Head: Normocephalic, atraumatic. Moist oral mucosal membranes  Eyes: Anicteric  Neck: Supple, trachea midline  Lungs:  Clear to auscultation, normal effort  Heart: S1S2 no rubs  Abdomen:  Soft, nontender, Bowel sounds present   Extremities: No peripheral edema.  Neurologic: Nonfocal, moving all four extremities  Skin: No lesions       Basic Metabolic Panel:  Recent Labs Lab 11/19/16 1129 11/20/16 0431 11/21/16 0442  NA 146* 145 144  K 3.7 3.1* 3.5  CL 102 107 111  CO2 31 31 28   GLUCOSE 189* 125* 114*  BUN 85* 79* 66*  CREATININE 4.51* 3.96* 2.94*  CALCIUM 8.7* 8.0* 7.5*  MG 2.2  --   --     Liver Function Tests:  Recent Labs Lab 11/19/16 1129  AST 25  ALT 11*  ALKPHOS 61  BILITOT 0.1*  PROT 6.6  ALBUMIN 3.0*   No results for input(s): LIPASE, AMYLASE in the last 168 hours. No results for input(s): AMMONIA in the last 168 hours.  CBC:  Recent Labs Lab 11/19/16 1129 11/20/16 0431  WBC 11.9* 10.5  NEUTROABS 10.3*  --   HGB 12.7* 12.0*  HCT 39.0* 36.4*  MCV 94.3 94.1  PLT 241 202    Cardiac Enzymes:  Recent Labs Lab 11/19/16 1129  TROPONINI 0.03*    BNP: Invalid input(s):  POCBNP  CBG:  Recent Labs Lab 11/20/16 2149 11/21/16 0739 11/21/16 1137 11/21/16 1642 11/21/16 1710  GLUCAP 122* 100* 192* 39* 91    Microbiology: Results for orders placed or performed during the hospital encounter of 03/02/15  Culture, blood (single)     Status: None   Collection Time: 03/02/15  4:25 PM  Result Value Ref Range Status   Micro Text Report   Final       COMMENT                   NO GROWTH AEROBICALLY/ANAEROBICALLY IN 5 DAYS   ANTIBIOTIC                                                      Culture, blood (single)     Status: None   Collection Time: 03/02/15  4:42 PM  Result Value Ref Range Status   Micro Text Report   Final       COMMENT                   NO GROWTH AEROBICALLY/ANAEROBICALLY IN 5 DAYS   ANTIBIOTIC  Coagulation Studies: No results for input(s): LABPROT, INR in the last 72 hours.  Urinalysis:  Recent Labs  11/19/16 1521  COLORURINE YELLOW*  LABSPEC 1.013  PHURINE 5.0  GLUCOSEU NEGATIVE  HGBUR NEGATIVE  BILIRUBINUR NEGATIVE  KETONESUR NEGATIVE  PROTEINUR NEGATIVE  NITRITE NEGATIVE  LEUKOCYTESUR NEGATIVE      Imaging: No results found.   Medications:   . sodium chloride 75 mL/hr at 11/21/16 1714   . allopurinol  100 mg Oral Daily  . amLODipine  5 mg Oral Daily  . apixaban  2.5 mg Oral BID  . atorvastatin  40 mg Oral QHS  . cyanocobalamin  1,000 mcg Intramuscular Q30 days  . diltiazem  240 mg Oral Daily  . famotidine  20 mg Oral QHS  . insulin aspart  0-5 Units Subcutaneous QHS  . insulin aspart  0-9 Units Subcutaneous TID WC  . insulin aspart protamine- aspart  5 Units Subcutaneous BID WC  . loratadine  10 mg Oral Daily  . mirtazapine  15 mg Oral QHS  . pantoprazole  40 mg Oral Daily  . primidone  50 mg Oral BID  . sodium chloride flush  3 mL Intravenous Q12H   acetaminophen **OR** acetaminophen, ondansetron **OR** ondansetron (ZOFRAN) IV  Assessment/  Plan:  82 y.o. male with a PMHx of bladder cancer, right bundle branch block, history of CVA, diabetes mellitus type 2, gout, hypertension, mitral valve prolapse, who was admitted to [facility] on 11/19/2016 for evaluation of weakness.   1.  Acute renal failure/chronic kidney disease stage IV baseline creatinine 3.0.  The patient had a creatinine of 3.0 in November, with an EGFR of 17.  Renal function worse on admission with a creatinine of 3.96 and EGFR of 12.  Renal ultrasound was negative for hydronephrosis.  - Renal function appears to have improved with IV fluid hydration. Continue hydration for now. Follow-up renal function tomorrow.  2.  Anemia chronic kidney disease.   no new hemoglobin today. Yesterday hemoglobin was 12.0. No indication for Procrit. Follow-up SPEP and UPEP that were previously ordered.  3.  Hypokalemia. Potassium improved to 3.5. Continue to periodically monitor.   LOS: 2 Harace Mccluney 12/20/20175:24 PM

## 2016-11-22 LAB — BASIC METABOLIC PANEL
Anion gap: 6 (ref 5–15)
BUN: 49 mg/dL — AB (ref 6–20)
CO2: 25 mmol/L (ref 22–32)
Calcium: 7.5 mg/dL — ABNORMAL LOW (ref 8.9–10.3)
Chloride: 113 mmol/L — ABNORMAL HIGH (ref 101–111)
Creatinine, Ser: 2.58 mg/dL — ABNORMAL HIGH (ref 0.61–1.24)
GFR calc Af Amer: 24 mL/min — ABNORMAL LOW (ref >60)
GFR, EST NON AFRICAN AMERICAN: 21 mL/min — AB (ref >60)
GLUCOSE: 116 mg/dL — AB (ref 65–99)
Potassium: 3.3 mmol/L — ABNORMAL LOW (ref 3.5–5.1)
Sodium: 144 mmol/L (ref 135–145)

## 2016-11-22 LAB — PROTEIN ELECTROPHORESIS, SERUM
A/G Ratio: 0.9 (ref 0.7–1.7)
ALPHA-1-GLOBULIN: 0.2 g/dL (ref 0.0–0.4)
Albumin ELP: 2.6 g/dL — ABNORMAL LOW (ref 2.9–4.4)
Alpha-2-Globulin: 0.9 g/dL (ref 0.4–1.0)
Beta Globulin: 1.1 g/dL (ref 0.7–1.3)
GLOBULIN, TOTAL: 2.8 g/dL (ref 2.2–3.9)
Gamma Globulin: 0.5 g/dL (ref 0.4–1.8)
TOTAL PROTEIN ELP: 5.4 g/dL — AB (ref 6.0–8.5)

## 2016-11-22 LAB — PROTEIN ELECTRO, RANDOM URINE
ALPHA-1-GLOBULIN, U: 4.6 %
Albumin ELP, Urine: 24.6 %
Alpha-2-Globulin, U: 17.3 %
Beta Globulin, U: 30.1 %
Gamma Globulin, U: 23.4 %
PDF: 0
Total Protein, Urine: 17.9 mg/dL

## 2016-11-22 LAB — RENAL FUNCTION PANEL
ALBUMIN: 2.2 g/dL — AB (ref 3.5–5.0)
Anion gap: 5 (ref 5–15)
BUN: 52 mg/dL — AB (ref 6–20)
CALCIUM: 7.5 mg/dL — AB (ref 8.9–10.3)
CO2: 26 mmol/L (ref 22–32)
CREATININE: 2.42 mg/dL — AB (ref 0.61–1.24)
Chloride: 114 mmol/L — ABNORMAL HIGH (ref 101–111)
GFR calc Af Amer: 26 mL/min — ABNORMAL LOW (ref >60)
GFR calc non Af Amer: 23 mL/min — ABNORMAL LOW (ref >60)
GLUCOSE: 118 mg/dL — AB (ref 65–99)
PHOSPHORUS: 2.4 mg/dL — AB (ref 2.5–4.6)
Potassium: 3.3 mmol/L — ABNORMAL LOW (ref 3.5–5.1)
SODIUM: 145 mmol/L (ref 135–145)

## 2016-11-22 LAB — GLUCOSE, CAPILLARY
GLUCOSE-CAPILLARY: 165 mg/dL — AB (ref 65–99)
GLUCOSE-CAPILLARY: 70 mg/dL (ref 65–99)
Glucose-Capillary: 95 mg/dL (ref 65–99)
Glucose-Capillary: 112 mg/dL — ABNORMAL HIGH (ref 65–99)

## 2016-11-22 MED ORDER — OXYCODONE HCL 5 MG PO TABS
5.0000 mg | ORAL_TABLET | Freq: Once | ORAL | Status: AC
  Administered 2016-11-22: 5 mg via ORAL
  Filled 2016-11-22: qty 1

## 2016-11-22 MED ORDER — POTASSIUM CHLORIDE CRYS ER 20 MEQ PO TBCR
40.0000 meq | EXTENDED_RELEASE_TABLET | Freq: Once | ORAL | Status: AC
  Administered 2016-11-22: 40 meq via ORAL
  Filled 2016-11-22: qty 2

## 2016-11-22 NOTE — Progress Notes (Signed)
New referral for home Palliative services at [facility] received from [place]. Referral made aware. Thank you. Flo Shanks RN, BSN, [place] [facility] [place], [facility] [phone] c

## 2016-11-22 NOTE — Progress Notes (Signed)
Per Dr. Bridgett Larsson in and out cath patient as he had a bladder scan of 444mL. Then wait for patient to void on his own

## 2016-11-22 NOTE — Progress Notes (Signed)
Daily Progress Note   Patient Name: Dean Garcia       Date: 11/22/2016 DOB: 1928/05/04  Age: 82 y.o. MRN#: [id number] Attending Physician: Demetrios Loll, MD Primary Care Physician: Kirk Ruths., MD Admit Date: 11/19/2016  Reason for Consultation/Follow-up: Establishing goals of care  Subjective: Patient alert, oriented, and denies pain. He is ready to be discharged today.   Spoke with wife. Kidney functions have slightly improved since beginning of hospitalization. Patient does not need dialysis but will continue to be monitored outpatient. Wife is hopeful for this improvement but still seems to understand the overall big picture of his chronic kidney disease and they may be faced with this decision again in the future. Discussed outpatient palliative services that could provide extra support and help navigate decision making if he should decline. She agrees with palliative to follow.  Length of Stay: 3  Current Medications: Scheduled Meds:  . allopurinol  100 mg Oral Daily  . amLODipine  5 mg Oral Daily  . apixaban  2.5 mg Oral BID  . atorvastatin  40 mg Oral QHS  . cyanocobalamin  1,000 mcg Intramuscular Q30 days  . diltiazem  240 mg Oral Daily  . famotidine  20 mg Oral QHS  . insulin aspart  0-5 Units Subcutaneous QHS  . insulin aspart  0-9 Units Subcutaneous TID WC  . insulin aspart protamine- aspart  5 Units Subcutaneous BID WC  . loratadine  10 mg Oral Daily  . mirtazapine  15 mg Oral QHS  . pantoprazole  40 mg Oral Daily  . potassium chloride  40 mEq Oral Once  . primidone  50 mg Oral BID  . sodium chloride flush  3 mL Intravenous Q12H    Continuous Infusions: . sodium chloride 75 mL/hr at 11/22/16 0646    PRN Meds: acetaminophen **OR** acetaminophen, ondansetron  **OR** ondansetron (ZOFRAN) IV  Physical Exam  Constitutional: He is oriented to person, place, and time. He is cooperative.  HENT:  Head: Normocephalic and atraumatic.  Cardiovascular: Regular rhythm and normal heart sounds.   Pulmonary/Chest: Effort normal. He has decreased breath sounds.  Abdominal: Normal appearance.  Neurological: He is alert and oriented to person, place, and time.  forgetful  Skin: Skin is warm and dry. There is pallor.  Psychiatric: He has a normal mood and affect. His  speech is normal and behavior is normal. Cognition and memory are normal.  Nursing note and vitals reviewed.          Vital Signs: BP (!) 145/63 (BP Location: Right Arm)   Pulse (!) 56   Temp 97.6 F (36.4 C) (Oral)   Resp 16   Ht 6\' 1"  (1.854 m)   Wt 75.3 kg (166 lb)   SpO2 92%   BMI 21.90 kg/m  SpO2: SpO2: 92 % O2 Device: O2 Device: Not Delivered O2 Flow Rate: O2 Flow Rate (L/min): 2 L/min  Intake/output summary:  Intake/Output Summary (Last 24 hours) at 11/22/16 1308 Last data filed at 11/22/16 1024  Gross per 24 hour  Intake             1202 ml  Output              450 ml  Net              752 ml   LBM: Last BM Date: 11/22/16 Baseline Weight: Weight: 75.3 kg (166 lb) Most recent weight: Weight: 75.3 kg (166 lb)       Palliative Assessment/Data: PPS 40%   Flowsheet Rows   Flowsheet Row Most Recent Value  Intake Tab  Referral Department  Hospitalist  Unit at Time of Referral  Med/Surg Unit  Palliative Care Primary Diagnosis  Nephrology  Date Notified  11/19/16  Palliative Care Type  New Palliative care  Reason for referral  Clarify Goals of Care  Date of Admission  11/19/16  Date first seen by Palliative Care  11/20/16  # of days Palliative referral response time  1 Day(s)  # of days IP prior to Palliative referral  0  Clinical Assessment  Palliative Performance Scale Score  40%  Psychosocial & Spiritual Assessment  Palliative Care Outcomes  Patient/Family  meeting held?  Yes  Who was at the meeting?  wife and patient  Palliative Care Outcomes  Clarified goals of care, Provided psychosocial or spiritual support, Counseled regarding hospice  Palliative Care follow-up planned  Yes, Facility  Actual Discharge Date  11/22/16 [facility] health]      Patient Active Problem List   Diagnosis Date Noted  . Pressure injury of skin 11/20/2016  . Palliative care encounter   . Goals of care, counseling/discussion   . Acute renal failure superimposed on chronic kidney disease ([place])   . ARF (acute renal failure) ([place]) 11/19/2016  . Moderate major depression, single episode ([place]) 10/22/2016  . Mild dementia 10/22/2016  . Paroxysmal atrial fibrillation ([place]) 02/21/2016  . A-fib ([place]) 02/12/2016  . Centrilobular emphysema ([place]) 12/27/2015  . B12 deficiency 04/12/2015  . Atherosclerosis of aorta ([place]) 10/23/2014  . 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 Has a tremor 07/17/2014  . Type 2 diabetes mellitus ([place]) 07/17/2014  . Edema 07/17/2014  . Billowing mitral valve 07/12/2014  . BP (high blood pressure) 07/12/2014  . Carotid artery narrowing 07/12/2014  . Impaired renal function 06/07/2014  . Proctitis, radiation 06/07/2014  . Malignant neoplasm of prostate ([place]) 06/07/2014  . Peripheral nerve disease ([place]) 06/07/2014  . Arthritis, degenerative 06/07/2014  . Disorder of peripheral nervous system ([place]) 06/07/2014  . Gout 06/07/2014  . 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 DD (diverticular disease) 06/07/2014  . Disease of rectum 06/07/2014  . Diabetes mellitus ([place]) 06/07/2014  . Malignant neoplasm of urinary bladder ([place]) 06/07/2014    Palliative Care Assessment & Plan   Patient Profile: 82 y.o. male  with past  medical history of tremors, hypertension, high cholesterol, gout, diabetes, CVA, carotid stenosis, prostate and bladder cancer s/p radiation, and CKD IV admitted on 11/19/2016 with weakness and decreased oral intake  from [facility] independent living. Due to weakness, he was transitioned to [facility] for a few days. Patient with acute on chronic renal failure-receiving IVF. Patient and wife discussing starting dialysis. Palliative medicine consultation for goals of care.         Assessment: Acute renal failure with chronic kidney disease stage IV Benign essential tremors Failure to thrive Diabetes mellitus Atrial fibrillation  Recommendations/Plan:  Kidney functions improved. Patient does not need dialysis yet and will continue to be monitored outpatient.   Plan is home with home health/PT today. Wife agrees with palliative services to follow outpatient.   Goals of Care and Additional Recommendations:  Limitations on Scope of Treatment: Full Scope Treatment-except DNR/DNI  Code Status: DNR   Code Status Orders        Start     Ordered   11/19/16 1901  Do not attempt resuscitation (DNR)  Continuous    Question Answer Comment  In the event of cardiac or respiratory ARREST Do not call a "code blue"   In the event of cardiac or respiratory ARREST Do not perform Intubation, CPR, defibrillation or ACLS   In the event of cardiac or respiratory ARREST Use medication by any route, position, wound care, and other measures to relive pain and suffering. May use oxygen, suction and manual treatment of airway obstruction as needed for comfort.      11/19/16 1900    Code Status History    Date Active Date Inactive Code Status Order ID Comments User Context   02/12/2016  8:30 PM 02/14/2016  6:23 PM DNR [id number]  Hillary Bow, MD ED    Advance Directive Documentation   Flowsheet Row Most Recent Value  Type of Advance Directive  Living will, Healthcare Power of Attorney  Pre-existing out of facility DNR order (yellow form or pink MOST form)  No data  "MOST" Form in Place?  No data       Prognosis:   Unable to determine  Discharge Planning:  Home with [place]  was discussed with patient, wife, RN, and Dr. Bridgett Larsson  Thank you for allowing the Palliative Medicine Team to assist in the care of this patient.   Time In: 1225 Time Out: 1300 Total Time 96min Prolonged Time Billed  no       Greater than 50%  of this time was spent counseling and coordinating care related to the above assessment and plan.  Ihor Dow, FNP-C Palliative Medicine Team  Phone: [phone] Fax: [phone]  Please contact Palliative Medicine Team phone at [phone] for questions and concerns.

## 2016-11-22 NOTE — Evaluation (Signed)
Physical Therapy Evaluation Patient Details Name: Dean Garcia MRN: [id number] DOB: 1927/12/06 Today's Date: 11/22/2016   History of Present Illness  Pt admitted for ARF with complaints of weakness. Pt with history of prostate cancer s/p radiation, HTN, DM, CVA, and Afib.   Clinical Impression  Pt is a pleasant 82 year old male who was admitted for ARF. Pt performs bed mobility with mod I and refuses further secondary to soiled diaper. Pt demonstrates sufficient strength for transfers, however will require transport WC for endurance as he does demonstrate weakness. Pt demonstrates deficits with strength/endurance/mobility. Pt with supportive family in home that can provide 24/7 assist. Would benefit from skilled PT to address above deficits and promote optimal return to PLOF. Recommend transition to [place] upon discharge from acute hospitalization.       Follow Up Recommendations Home health PT;Supervision/Assistance - 24 hour    Equipment Recommendations   (needs transport WC)    Recommendations for Other Services       Precautions / Restrictions Precautions Precautions: Fall Restrictions Weight Bearing Restrictions: No      Mobility  Bed Mobility Overal bed mobility: Modified Independent             General bed mobility comments: safe technique performed with slow transfer. Use of railing for lifting trunk. Once seated at EOB, pt able to sit with supervision. Pt refused further OOB mobility as he had soiled diaper. RN aide called for clean up  Transfers                 General transfer comment: refused at this time, however does had adequate strength for trial when ready  Ambulation/Gait                Stairs            Wheelchair Mobility    Modified Rankin (Stroke Patients Only)       Balance Overall balance assessment: History of Falls;Needs assistance Sitting-balance support: Feet supported Sitting balance-Leahy Scale: Good                                       Pertinent Vitals/Pain Pain Assessment: No/denies pain    Home Living Family/patient expects to be discharged to:: Private residence Living Arrangements: Spouse/significant other Available Help at Discharge: Family;Available 24 hours/day Type of Home: House Home Access: Stairs to enter Entrance Stairs-Rails: Right Entrance Stairs-Number of Steps: 1 Home Layout: One level Home Equipment: Walker - 2 wheels;Walker - 4 wheels;Grab bars - toilet      Prior Function Level of Independence: Independent with assistive device(s)         Comments: resides at [place], recently at [facility] for same complaints     Hand Dominance        Extremity/Trunk Assessment   Upper Extremity Assessment Upper Extremity Assessment: Generalized weakness (B UE grossly 3+/5)    Lower Extremity Assessment Lower Extremity Assessment: Generalized weakness (B LE grossly 4/5)       Communication   Communication: No difficulties  Cognition Arousal/Alertness: Awake/alert Behavior During Therapy: WFL for tasks assessed/performed Overall Cognitive Status: Within Functional Limits for tasks assessed                      General Comments      Exercises Other Exercises Other Exercises: supine ther-ex performed including B LE SLRs, ankle pumps, and quad  sets. All ther-ex performed x 10 reps with pt fatigues with noted tremors in B LEs. Cues given for correct technique.   Assessment/Plan    PT Assessment Patient needs continued PT services  PT Problem List Decreased strength;Decreased activity tolerance;Decreased balance;Decreased mobility          PT Treatment Interventions DME instruction;Gait training;Therapeutic exercise;Balance training    PT Goals (Current goals can be found in the Care Plan section)  Acute Rehab PT Goals Patient Stated Goal: to get stronger and stay at home PT Goal Formulation: With patient Time For Goal  Achievement: 12/06/16 Potential to Achieve Goals: Good    Frequency Min 2X/week   Barriers to discharge        Co-evaluation               End of Session   Activity Tolerance: Patient tolerated treatment well Patient left: in bed;with bed alarm set;with family/visitor present Nurse Communication: Mobility status         Time: 1010-1033 PT Time Calculation (min) (ACUTE ONLY): 23 min   Charges:   PT Evaluation $PT Eval Moderate Complexity: 1 Procedure PT Treatments $Therapeutic Exercise: 8-22 mins   PT G Codes:        Gesenia Bantz 12/04/2016, 10:53 AM  Greggory Stallion, PT, DPT [phone]

## 2016-11-22 NOTE — Care Management Important Message (Signed)
Important Message  Patient Details  Name: Dean Garcia MRN: [id number] Date of Birth: Jun 01, 1928   Medicare Important Message Given:  Yes    Beverly Sessions, RN 11/22/2016, 12:18 PM

## 2016-11-22 NOTE — Discharge Summary (Signed)
[facility] [place] NAME: Dean Garcia    MR#:  [id number]  DATE OF BIRTH:  1928-01-16  DATE OF ADMISSION:  11/19/2016   ADMITTING PHYSICIAN: Gladstone Lighter, MD  DATE OF DISCHARGE: 11/22/2016 PRIMARY CARE PHYSICIAN: Kirk Ruths., MD   ADMISSION DIAGNOSIS:  ARF (acute renal failure) (HCC) [N17.9] Weakness [R53.1] Failure to thrive in adult [R62.7] Acute renal failure superimposed on chronic kidney disease, unspecified CKD stage, unspecified acute renal failure type ([place]) [N17.9, N18.9] DISCHARGE DIAGNOSIS:  Active Problems:   ARF (acute renal failure) (HCC)   Pressure injury of skin   Palliative care encounter   Goals of care, counseling/discussion   Acute renal failure superimposed on chronic kidney disease ([place])  SECONDARY DIAGNOSIS:   Past Medical History:  Diagnosis Date  . Bladder cancer ([place])   . Bundle branch block, right   . Carotid stenosis   . Colon polyp   . CVA (cerebral infarction)   . Diabetes ([place])   . Gout   . High cholesterol   . Hypertension   . Mitral valve prolapse   . Prostate cancer ([place])   . Tremor    HOSPITAL COURSE:  Dean Garcia a 82 y.o. malewith a known history of Prostate cancer status post radiation, hypertension, insulin-dependent diabetes mellitus, history of stroke, atrial fibrillation on eliquis presents to hospital from [facility] independent living secondary to generalized weakness, decreased oral intake and inability to walk for the last few days.  #1 acute renal failure on CK D stage IV Improved with IV hydration, discontinued torsemide, losartan and metolazone. Renal ultrasound reviewed Supportive care  #2 benign essential tremors-at baseline. Continue propranolol and primidone  #3 failure to thrive-continue antidepressant Remeron. Palliative care service as outpatient, added Megace to current regimen for appetite stimulant  #4 diabetes mellitus-continue 70/30  insulin twice a day and on Linagliptin, which werehold due to low BS yesterday. BS is elevated today, resume after discharge.  #5 Afib- chronic, rate controlled. Continue cardizem, continue eliquis for anti coagulation  #6 DVT prophylaxis-oneliquis  Hypokalema, given KCl. DISCHARGE CONDITIONS:  Stable, discharge to home with HHPT today. CONSULTS OBTAINED:  Treatment Team:  Anthonette Legato, MD DRUG ALLERGIES:   Allergies  Allergen Reactions  . Demerol [Meperidine] Nausea And Vomiting    Nausea/vomiting   DISCHARGE MEDICATIONS:   Allergies as of 11/22/2016      Reactions   Demerol [meperidine] Nausea And Vomiting   Nausea/vomiting      Medication List    STOP taking these medications   losartan 50 MG tablet Commonly known as:  COZAAR   metolazone 2.5 MG tablet Commonly known as:  ZAROXOLYN   torsemide 20 MG tablet Commonly known as:  DEMADEX     TAKE these medications   ACCU-CHEK COMPACT PLUS test strip Generic drug:  glucose blood   allopurinol 100 MG tablet Commonly known as:  ZYLOPRIM Take 100 mg by mouth daily.   amLODipine 5 MG tablet Commonly known as:  NORVASC Take 2.5 mg by mouth daily.   B-D ULTRAFINE III SHORT PEN 31G X 8 MM Misc Generic drug:  Insulin Pen Needle   cyanocobalamin 1000 MCG/ML injection Commonly known as:  (VITAMIN B-12) Inject 1,000 mcg into the muscle every 30 (thirty) days.   diltiazem 240 MG 24 hr capsule Commonly known as:  CARDIZEM CD Take 1 capsule (240 mg total) by mouth daily.   ELIQUIS 2.5 MG Tabs tablet Generic drug:  apixaban Take 2.5  mg by mouth 2 (two) times daily.   fexofenadine 180 MG tablet Commonly known as:  ALLEGRA Take 180 mg by mouth as needed for allergies or rhinitis.   insulin lispro protamine-lispro (75-25) 100 UNIT/ML Susp injection Commonly known as:  HUMALOG 75/25 MIX Inject 10 Units into the skin daily before supper.   LIPITOR 40 MG tablet Generic drug:  atorvastatin Take 40 mg by  mouth at bedtime.   mirtazapine 15 MG tablet Commonly known as:  REMERON Take 1 tablet (15 mg total) by mouth at bedtime.   omeprazole 20 MG capsule Commonly known as:  PRILOSEC Take 20 mg by mouth daily.   ondansetron 4 MG disintegrating tablet Commonly known as:  ZOFRAN-ODT Take 4 mg by mouth every 8 (eight) hours as needed for nausea or vomiting.   potassium chloride 10 MEQ tablet Commonly known as:  K-DUR Take 10 mEq by mouth daily.   primidone 50 MG tablet Commonly known as:  MYSOLINE Take 50 mg by mouth 2 (two) times daily.   propranolol 10 MG tablet Commonly known as:  INDERAL Take 10 mg by mouth 2 (two) times daily.   ranitidine 150 MG tablet Commonly known as:  ZANTAC Take 300 mg by mouth every evening.   sitaGLIPtin 25 MG tablet Commonly known as:  JANUVIA Take 25 mg by mouth daily.        DISCHARGE INSTRUCTIONS:  See AVS.  If you experience worsening of your admission symptoms, develop shortness of breath, life threatening emergency, suicidal or homicidal thoughts you must seek medical attention immediately by calling 911 or calling your MD immediately  if symptoms less severe.  You Must read complete instructions/literature along with all the possible adverse reactions/side effects for all the Medicines you take and that have been prescribed to you. Take any new Medicines after you have completely understood and accpet all the possible adverse reactions/side effects.   Please note  You were cared for by a hospitalist during your hospital stay. If you have any questions about your discharge medications or the care you received while you were in the hospital after you are discharged, you can call the unit and asked to speak with the hospitalist on call if the hospitalist that took care of you is not available. Once you are discharged, your primary care physician will handle any further medical issues. Please note that NO REFILLS for any discharge medications  will be authorized once you are discharged, as it is imperative that you return to your primary care physician (or establish a relationship with a primary care physician if you do not have one) for your aftercare needs so that they can reassess your need for medications and monitor your lab values.    On the day of Discharge:  VITAL SIGNS:  Blood pressure 132/66, pulse 70, temperature 98.2 F (36.8 C), temperature source Oral, resp. rate 18, height 6\' 1"  (1.854 m), weight 166 lb (75.3 kg), SpO2 95 %. PHYSICAL EXAMINATION:  GENERAL:  82 y.o.-year-old patient lying in the bed with no acute distress.  EYES: Pupils equal, round, reactive to light and accommodation. No scleral icterus. Extraocular muscles intact.  HEENT: Head atraumatic, normocephalic. Oropharynx and nasopharynx clear.  NECK:  Supple, no jugular venous distention. No thyroid enlargement, no tenderness.  LUNGS: Normal breath sounds bilaterally, no wheezing, rales,rhonchi or crepitation. No use of accessory muscles of respiration.  CARDIOVASCULAR: S1, S2 normal. No murmurs, rubs, or gallops.  ABDOMEN: Soft, non-tender, non-distended. Bowel sounds present. No organomegaly  or mass.  EXTREMITIES: No pedal edema, cyanosis, or clubbing.  NEUROLOGIC: Cranial nerves II through XII are intact. Muscle strength 4/5 in all extremities. Sensation intact. Gait not checked.  PSYCHIATRIC: The patient is alert and oriented x 3.  SKIN: No obvious rash, lesion, or ulcer.  DATA REVIEW:   CBC  Recent Labs Lab 11/20/16 0431  WBC 10.5  HGB 12.0*  HCT 36.4*  PLT 202    Chemistries   Recent Labs Lab 11/19/16 1129  11/22/16 0502  NA 146*  < > 144  145  K 3.7  < > 3.3*  3.3*  CL 102  < > 113*  114*  CO2 31  < > 25  26  GLUCOSE 189*  < > 116*  118*  BUN 85*  < > 49*  52*  CREATININE 4.51*  < > 2.58*  2.42*  CALCIUM 8.7*  < > 7.5*  7.5*  MG 2.2  --   --   AST 25  --   --   ALT 11*  --   --   ALKPHOS 61  --   --   BILITOT 0.1*   --   --   < > = values in this interval not displayed.   Microbiology Results  Results for orders placed or performed during the hospital encounter of 03/02/15  Culture, blood (single)     Status: None   Collection Time: 03/02/15  4:25 PM  Result Value Ref Range Status   Micro Text Report   Final       COMMENT                   NO GROWTH AEROBICALLY/ANAEROBICALLY IN 5 DAYS   ANTIBIOTIC                                                      Culture, blood (single)     Status: None   Collection Time: 03/02/15  4:42 PM  Result Value Ref Range Status   Micro Text Report   Final       COMMENT                   NO GROWTH AEROBICALLY/ANAEROBICALLY IN 5 DAYS   ANTIBIOTIC                                                        RADIOLOGY:  No results found.   Management plans discussed with the patient, family and they are in agreement.  CODE STATUS:     Code Status Orders        Start     Ordered   11/19/16 1901  Do not attempt resuscitation (DNR)  Continuous    Question Answer Comment  In the event of cardiac or respiratory ARREST Do not call a "code blue"   In the event of cardiac or respiratory ARREST Do not perform Intubation, CPR, defibrillation or ACLS   In the event of cardiac or respiratory ARREST Use medication by any route, position, wound care, and other measures to relive pain and suffering. May use oxygen, suction and manual treatment of airway obstruction as  needed for comfort.      11/19/16 1900    Code Status History    Date Active Date Inactive Code Status Order ID Comments User Context   02/12/2016  8:30 PM 02/14/2016  6:23 PM DNR [id number]  Hillary Bow, MD ED    Advance Directive Documentation   Flowsheet Row Most Recent Value  Type of Advance Directive  Living will, Healthcare Power of Attorney  Pre-existing out of facility DNR order (yellow form or pink MOST form)  No data  "MOST" Form in Place?  No data      TOTAL TIME TAKING CARE OF THIS  PATIENT: 37 minutes.    Demetrios Loll M.D on 11/22/2016 at 3:28 PM  Between 7am to 6pm - Pager - [phone]  After 6pm go to [email or web address] - Proofreader  [facility]  Office  [phone]  CC: Primary care physician; Kirk Ruths., MD   Note: This dictation was prepared with Dragon dictation along with smaller phrase technology. Any transcriptional errors that result from this process are unintentional.

## 2016-11-22 NOTE — Progress Notes (Signed)
Per Dr. Bridgett Larsson okay to discontinue discharge order for tonight as pt has still not voided and needs in and out cath

## 2016-11-22 NOTE — Progress Notes (Signed)
[facility]  ROUNDING NOTE   Subjective:  Renal function continues to improve. Creatinine down to 2.4. Overall patient appears to be in better spirits.   Objective:  Vital signs in last 24 hours:  Temp:  [97.6 F (36.4 C)-100.2 F (37.9 C)] 98.3 F (36.8 C) (12/21 1549) Pulse Rate:  [36-70] 64 (12/21 1549) Resp:  [16-20] 20 (12/21 1549) BP: (111-168)/(36-66) 168/60 (12/21 1549) SpO2:  [92 %-98 %] 98 % (12/21 1549)  Weight change:  Filed Weights   11/19/16 1127  Weight: 75.3 kg (166 lb)    Intake/Output: I/O last 3 completed shifts: In: 2620 [P.O.:360; I.V.:2260] Out: 1250 [Urine:1250]   Intake/Output this shift:  Total I/O In: 1073 [P.O.:480; I.V.:593] Out: 0   Physical Exam: General: No acute distress  Head: Normocephalic, atraumatic. Moist oral mucosal membranes  Eyes: Anicteric  Neck: Supple, trachea midline  Lungs:  Clear to auscultation, normal effort  Heart: S1S2 no rubs  Abdomen:  Soft, nontender, Bowel sounds present   Extremities: No peripheral edema.  Neurologic: Nonfocal, moving all four extremities  Skin: No lesions       Basic Metabolic Panel:  Recent Labs Lab 11/19/16 1129 11/20/16 0431 11/21/16 0442 11/22/16 0502  NA 146* 145 144 144  145  K 3.7 3.1* 3.5 3.3*  3.3*  CL 102 107 111 113*  114*  CO2 31 31 28 25  26   GLUCOSE 189* 125* 114* 116*  118*  BUN 85* 79* 66* 49*  52*  CREATININE 4.51* 3.96* 2.94* 2.58*  2.42*  CALCIUM 8.7* 8.0* 7.5* 7.5*  7.5*  MG 2.2  --   --   --   PHOS  --   --   --  2.4*    Liver Function Tests:  Recent Labs Lab 11/19/16 1129 11/22/16 0502  AST 25  --   ALT 11*  --   ALKPHOS 61  --   BILITOT 0.1*  --   PROT 6.6  --   ALBUMIN 3.0* 2.2*   No results for input(s): LIPASE, AMYLASE in the last 168 hours. No results for input(s): AMMONIA in the last 168 hours.  CBC:  Recent Labs Lab 11/19/16 1129 11/20/16 0431  WBC 11.9* 10.5  NEUTROABS 10.3*  --   HGB 12.7* 12.0*   HCT 39.0* 36.4*  MCV 94.3 94.1  PLT 241 202    Cardiac Enzymes:  Recent Labs Lab 11/19/16 1129  TROPONINI 0.03*    BNP: Invalid input(s): POCBNP  CBG:  Recent Labs Lab 11/21/16 1710 11/21/16 2106 11/22/16 0745 11/22/16 1203 11/22/16 1713  GLUCAP 91 124* 95 165* 26    Microbiology: Results for orders placed or performed during the hospital encounter of 03/02/15  Culture, blood (single)     Status: None   Collection Time: 03/02/15  4:25 PM  Result Value Ref Range Status   Micro Text Report   Final       COMMENT                   NO GROWTH AEROBICALLY/ANAEROBICALLY IN 5 DAYS   ANTIBIOTIC                                                      Culture, blood (single)     Status: None   Collection Time: 03/02/15  4:42  PM  Result Value Ref Range Status   Micro Text Report   Final       COMMENT                   NO GROWTH AEROBICALLY/ANAEROBICALLY IN 5 DAYS   ANTIBIOTIC                                                        Coagulation Studies: No results for input(s): LABPROT, INR in the last 72 hours.  Urinalysis: No results for input(s): COLORURINE, LABSPEC, PHURINE, GLUCOSEU, HGBUR, BILIRUBINUR, KETONESUR, PROTEINUR, UROBILINOGEN, NITRITE, LEUKOCYTESUR in the last 72 hours.  Invalid input(s): APPERANCEUR    Imaging: No results found.   Medications:   . sodium chloride 75 mL/hr at 11/22/16 0646   . allopurinol  100 mg Oral Daily  . amLODipine  5 mg Oral Daily  . apixaban  2.5 mg Oral BID  . atorvastatin  40 mg Oral QHS  . cyanocobalamin  1,000 mcg Intramuscular Q30 days  . diltiazem  240 mg Oral Daily  . famotidine  20 mg Oral QHS  . insulin aspart  0-5 Units Subcutaneous QHS  . insulin aspart  0-9 Units Subcutaneous TID WC  . insulin aspart protamine- aspart  5 Units Subcutaneous BID WC  . loratadine  10 mg Oral Daily  . mirtazapine  15 mg Oral QHS  . pantoprazole  40 mg Oral Daily  . primidone  50 mg Oral BID  . sodium chloride flush  3  mL Intravenous Q12H   acetaminophen **OR** acetaminophen, ondansetron **OR** ondansetron (ZOFRAN) IV  Assessment/ Plan:  82 y.o. male with a PMHx of bladder cancer, right bundle branch block, history of CVA, diabetes mellitus type 2, gout, hypertension, mitral valve prolapse, who was admitted to [facility] on 11/19/2016 for evaluation of weakness.   1.  Acute renal failure/chronic kidney disease stage IV baseline creatinine 3.0.  The patient had a creatinine of 3.0 in November, with an EGFR of 17.  Renal function worse on admission with a creatinine of 3.96 and EGFR of 12.  Renal ultrasound was negative for hydronephrosis.  - the patient's renal function has significantly improved.  In fact his creatinine is now below his baseline at 2.4. At this point in time renal replacement therapy is not necessary.  We recommend continued periodic monitoring of his renal function as an outpatient.  2.  Anemia chronic kidney disease.  SPEP still pending however UPEP negative.  Hemoglobin stable at 12.  Continue to monitor as an outpatient.  3.  Hypokalemia. Potassium is down a bit today at 3.3.  Recommend continued monitoring as an outpatient.  He received a dose of potassium chloride earlier today.   LOS: 3 Tami Blass 12/21/20175:19 PM

## 2016-11-22 NOTE — Care Management (Signed)
Patient admitted for AKI.  Lives at [facility] independent living with his wife/  PCP Dr Ouida Sills.  Patient wishes to return to independent living.  Patient has a "special recliner" and rollator in the home.  PCS services 2 hours a day, 3 days a week.  PT has recommended home health PT services.  MD has written for home health PT and RN.  Patient and family have declined.  They would like to be followed by PT on [facility].  I have faxed the PT ordder to Gaye Pollack, PT coordinator at [facility].  Wife has purchased out of pocket transfer WC, but was provided rx to submit to insurance.  Palliative has recommended palliative services at home.  Patient and family in agreement.  Santiago Glad from [facility] [employer] notified.  RNCM signing off

## 2016-11-23 LAB — GLUCOSE, CAPILLARY
GLUCOSE-CAPILLARY: 97 mg/dL (ref 65–99)
Glucose-Capillary: 180 mg/dL — ABNORMAL HIGH (ref 65–99)

## 2016-11-23 MED ORDER — TAMSULOSIN HCL 0.4 MG PO CAPS: 0.4000 mg | ORAL_CAPSULE | Freq: Every day | ORAL | 0 refills | Status: DC

## 2016-11-23 MED ORDER — TAMSULOSIN HCL 0.4 MG PO CAPS
0.4000 mg | ORAL_CAPSULE | Freq: Every day | ORAL | Status: DC
  Administered 2016-11-23: 0.4 mg via ORAL
  Filled 2016-11-23: qty 1

## 2016-11-23 NOTE — Discharge Summary (Signed)
[facility] [place] NAME: Dean Garcia    MR#:  [id number]  DATE OF BIRTH:  December 20, 1927  DATE OF ADMISSION:  11/19/2016   ADMITTING PHYSICIAN: Gladstone Lighter, MD  DATE OF DISCHARGE: 11/23/2016  PRIMARY CARE PHYSICIAN: Dean Garcia., MD   ADMISSION DIAGNOSIS:  ARF (acute renal failure) (HCC) [N17.9] Weakness [R53.1] Failure to thrive in adult [R62.7] Acute renal failure superimposed on chronic kidney disease, unspecified CKD stage, unspecified acute renal failure type ([place]) [N17.9, N18.9] DISCHARGE DIAGNOSIS:  Active Problems:   ARF (acute renal failure) (HCC)   Pressure injury of skin   Palliative care encounter   Goals of care, counseling/discussion   Acute renal failure superimposed on chronic kidney disease ([place])  SECONDARY DIAGNOSIS:   Past Medical History:  Diagnosis Date  . Bladder cancer ([place])   . Bundle branch block, right   . Carotid stenosis   . Colon polyp   . CVA (cerebral infarction)   . Diabetes ([place])   . Gout   . High cholesterol   . Hypertension   . Mitral valve prolapse   . Prostate cancer ([place])   . Tremor    HOSPITAL COURSE:  Dean Garcia a 82 y.o. malewith a known history of Prostate cancer status post radiation, hypertension, insulin-dependent diabetes mellitus, history of stroke, atrial fibrillation on eliquis presents to hospital from [facility] independent living secondary to generalized weakness, decreased oral intake and inability to walk for the last few days.  #1 acute renal failure on CK D stage IV Improved with IV hydration, discontinued torsemide, losartan and metolazone. Renal ultrasound reviewed Supportive care  #2 benign essential tremors-at baseline. Continue propranolol and primidone  #3 failure to thrive-continue antidepressant Remeron. Palliative care service as outpatient, added Megace to current regimen for appetite stimulant  #4 diabetes mellitus-continue 70/30  insulin twice a day and on Linagliptin, which werehold due to low BS yesterday. BS is elevated today, resume after discharge.  #5 Afib- chronic, rate controlled. Continue cardizem, continue eliquis for anti coagulation  #6 DVT prophylaxis-oneliquis  Hypokalema, given KCl. Acute urine retention,  Foley placement and follow up urology as outpatient. DISCHARGE CONDITIONS:  Stable, discharge to home with HHPT today.,  CONSULTS OBTAINED:  Treatment Team:  Anthonette Legato, MD DRUG ALLERGIES:   Allergies  Allergen Reactions  . Demerol [Meperidine] Nausea And Vomiting    Nausea/vomiting   DISCHARGE MEDICATIONS:   Allergies as of 11/23/2016      Reactions   Demerol [meperidine] Nausea And Vomiting   Nausea/vomiting      Medication List    STOP taking these medications   losartan 50 MG tablet Commonly known as:  COZAAR   metolazone 2.5 MG tablet Commonly known as:  ZAROXOLYN   torsemide 20 MG tablet Commonly known as:  DEMADEX     TAKE these medications   ACCU-CHEK COMPACT PLUS test strip Generic drug:  glucose blood   allopurinol 100 MG tablet Commonly known as:  ZYLOPRIM Take 100 mg by mouth daily.   amLODipine 5 MG tablet Commonly known as:  NORVASC Take 2.5 mg by mouth daily.   B-D ULTRAFINE III SHORT PEN 31G X 8 MM Misc Generic drug:  Insulin Pen Needle   cyanocobalamin 1000 MCG/ML injection Commonly known as:  (VITAMIN B-12) Inject 1,000 mcg into the muscle every 30 (thirty) days.   diltiazem 240 MG 24 hr capsule Commonly known as:  CARDIZEM CD Take 1 capsule (240 mg total) by mouth  daily.   ELIQUIS 2.5 MG Tabs tablet Generic drug:  apixaban Take 2.5 mg by mouth 2 (two) times daily.   fexofenadine 180 MG tablet Commonly known as:  ALLEGRA Take 180 mg by mouth as needed for allergies or rhinitis.   insulin lispro protamine-lispro (75-25) 100 UNIT/ML Susp injection Commonly known as:  HUMALOG 75/25 MIX Inject 10 Units into the skin daily before  supper.   LIPITOR 40 MG tablet Generic drug:  atorvastatin Take 40 mg by mouth at bedtime.   mirtazapine 15 MG tablet Commonly known as:  REMERON Take 1 tablet (15 mg total) by mouth at bedtime.   omeprazole 20 MG capsule Commonly known as:  PRILOSEC Take 20 mg by mouth daily.   ondansetron 4 MG disintegrating tablet Commonly known as:  ZOFRAN-ODT Take 4 mg by mouth every 8 (eight) hours as needed for nausea or vomiting.   potassium chloride 10 MEQ tablet Commonly known as:  K-DUR Take 10 mEq by mouth daily.   primidone 50 MG tablet Commonly known as:  MYSOLINE Take 50 mg by mouth 2 (two) times daily.   propranolol 10 MG tablet Commonly known as:  INDERAL Take 10 mg by mouth 2 (two) times daily.   ranitidine 150 MG tablet Commonly known as:  ZANTAC Take 300 mg by mouth every evening.   sitaGLIPtin 25 MG tablet Commonly known as:  JANUVIA Take 25 mg by mouth daily.   tamsulosin 0.4 MG Caps capsule Commonly known as:  FLOMAX Take 1 capsule (0.4 mg total) by mouth daily. Start taking on:  11/24/2016        DISCHARGE INSTRUCTIONS:  See AVS.  If you experience worsening of your admission symptoms, develop shortness of breath, life threatening emergency, suicidal or homicidal thoughts you must seek medical attention immediately by calling 911 or calling your MD immediately  if symptoms less severe.  You Must read complete instructions/literature along with all the possible adverse reactions/side effects for all the Medicines you take and that have been prescribed to you. Take any new Medicines after you have completely understood and accpet all the possible adverse reactions/side effects.   Please note  You were cared for by a hospitalist during your hospital stay. If you have any questions about your discharge medications or the care you received while you were in the hospital after you are discharged, you can call the unit and asked to speak with the hospitalist on  call if the hospitalist that took care of you is not available. Once you are discharged, your primary care physician will handle any further medical issues. Please note that NO REFILLS for any discharge medications will be authorized once you are discharged, as it is imperative that you return to your primary care physician (or establish a relationship with a primary care physician if you do not have one) for your aftercare needs so that they can reassess your need for medications and monitor your lab values.    On the day of Discharge:  VITAL SIGNS:  Blood pressure (!) 145/53, pulse (!) 59, temperature 98.3 F (36.8 C), temperature source Oral, resp. rate 17, height 6\' 1"  (1.854 m), weight 166 lb (75.3 kg), SpO2 99 %. PHYSICAL EXAMINATION:  GENERAL:  82 y.o.-year-old patient lying in the bed with no acute distress.  EYES: Pupils equal, round, reactive to light and accommodation. No scleral icterus. Extraocular muscles intact.  HEENT: Head atraumatic, normocephalic. Oropharynx and nasopharynx clear.  NECK:  Supple, no jugular venous distention. No  thyroid enlargement, no tenderness.  LUNGS: Normal breath sounds bilaterally, no wheezing, rales,rhonchi or crepitation. No use of accessory muscles of respiration.  CARDIOVASCULAR: S1, S2 normal. No murmurs, rubs, or gallops.  ABDOMEN: Soft, non-tender, non-distended. Bowel sounds present. No organomegaly or mass.  EXTREMITIES: No pedal edema, cyanosis, or clubbing.  NEUROLOGIC: Cranial nerves II through XII are intact. Muscle strength 4/5 in all extremities. Sensation intact. Gait not checked.  PSYCHIATRIC: The patient is alert and oriented x 3.  SKIN: No obvious rash, lesion, or ulcer.  DATA REVIEW:   CBC  Recent Labs Lab 11/20/16 0431  WBC 10.5  HGB 12.0*  HCT 36.4*  PLT 202    Chemistries   Recent Labs Lab 11/19/16 1129  11/22/16 0502  NA 146*  < > 144  145  K 3.7  < > 3.3*  3.3*  CL 102  < > 113*  114*  CO2 31  < > 25  26   GLUCOSE 189*  < > 116*  118*  BUN 85*  < > 49*  52*  CREATININE 4.51*  < > 2.58*  2.42*  CALCIUM 8.7*  < > 7.5*  7.5*  MG 2.2  --   --   AST 25  --   --   ALT 11*  --   --   ALKPHOS 61  --   --   BILITOT 0.1*  --   --   < > = values in this interval not displayed.   Microbiology Results  Results for orders placed or performed during the hospital encounter of 03/02/15  Culture, blood (single)     Status: None   Collection Time: 03/02/15  4:25 PM  Result Value Ref Range Status   Micro Text Report   Final       COMMENT                   NO GROWTH AEROBICALLY/ANAEROBICALLY IN 5 DAYS   ANTIBIOTIC                                                      Culture, blood (single)     Status: None   Collection Time: 03/02/15  4:42 PM  Result Value Ref Range Status   Micro Text Report   Final       COMMENT                   NO GROWTH AEROBICALLY/ANAEROBICALLY IN 5 DAYS   ANTIBIOTIC                                                        RADIOLOGY:  No results found.   Management plans discussed with the patient, his wife and they are in agreement.  CODE STATUS:     Code Status Orders        Start     Ordered   11/19/16 1901  Do not attempt resuscitation (DNR)  Continuous    Question Answer Comment  In the event of cardiac or respiratory ARREST Do not call a "code blue"   In the event of cardiac or respiratory ARREST Do not  perform Intubation, CPR, defibrillation or ACLS   In the event of cardiac or respiratory ARREST Use medication by any route, position, wound care, and other measures to relive pain and suffering. May use oxygen, suction and manual treatment of airway obstruction as needed for comfort.      11/19/16 1900    Code Status History    Date Active Date Inactive Code Status Order ID Comments User Context   02/12/2016  8:30 PM 02/14/2016  6:23 PM DNR [id number]  Hillary Bow, MD ED    Advance Directive Documentation   Flowsheet Row Most Recent Value  Type  of Advance Directive  Living will, Healthcare Power of Attorney  Pre-existing out of facility DNR order (yellow form or pink MOST form)  No data  "MOST" Form in Place?  No data      TOTAL TIME TAKING CARE OF THIS PATIENT:  38 minutes.    Demetrios Loll M.D on 11/23/2016 at 3:24 PM  Between 7am to 6pm - Pager - [phone]  After 6pm go to [email or web address] - Proofreader  [facility]  Office  [phone]  CC: Primary care physician; Dean Garcia., MD   Note: This dictation was prepared with Dragon dictation along with smaller phrase technology. Any transcriptional errors that result from this process are unintentional.

## 2016-11-23 NOTE — Progress Notes (Signed)
Md notified of pt inability to void this shift and bladder scan results. Orders per dr. pyreddy to in and out cath x 1. MD also made aware of low DBP and bradycardia throughout the shift, pt is asymptomatic so new new orders at this time. Will continue to monitor.

## 2016-11-23 NOTE — Progress Notes (Signed)
Pt was was helped to the bedside commode to use the bathroom. Pt voided 50 mL. Pt was bladder scanned for post residual, bladder scan showed 298 mL. Prime Doc was notified of findings, no new orders at this time. Will continue to encourage ambulation to the bathroom.   Dean Garcia [employer]

## 2016-11-23 NOTE — Progress Notes (Signed)
Patient discharge teaching given, including activity, diet, follow-up appoints, and medications. Patient verbalized understanding of all discharge instructions. Pt is being discharge to [facility]. IV access was d/c'd. 14 Fr foley is intact, pt and wife was educated on proper care of foley.Vitals are stable. Skin is intact except as charted in most recent assessments. Pt to be escorted out by volunteer, to be driven home by wife.  Dean Garcia [employer]

## 2016-11-23 NOTE — Discharge Instructions (Signed)
Heart healthy and ADA diet. HHPT Palliative care service as outpatient. Keep Foley cath until urology appointment.

## 2016-11-23 NOTE — Progress Notes (Signed)
RN inserted a 14 Fr  Foley per order. Teaching was provided to pt and wife on peri care and how to properly empty foley bag. Home Health has been set up. RN will proceed with discharge.   Dean Garcia [employer]

## 2016-11-23 NOTE — Progress Notes (Signed)
Verbal order from Prime Doc to not give Norvasc 5 mg and Diltiazem 240 mg at this time, current HR 51 and BP 148/48. Will continue to monitor pt.   Dean Garcia [employer]

## 2016-11-29 ENCOUNTER — Inpatient Hospital Stay
Admission: EM | Admit: 2016-11-29 | Discharge: 2016-12-04 | DRG: 291 | Disposition: A | Discharge status: Skilled Nursing Facility | Payer: Medicare Other | Attending: Internal Medicine | Admitting: Internal Medicine

## 2016-11-29 ENCOUNTER — Emergency Department: Payer: Medicare Other

## 2016-11-29 ENCOUNTER — Other Ambulatory Visit: Payer: No Typology Code available for payment source

## 2016-11-29 DIAGNOSIS — M659 Synovitis and tenosynovitis, unspecified: Secondary | ICD-10-CM | POA: Diagnosis present

## 2016-11-29 DIAGNOSIS — Y95 Nosocomial condition: Secondary | ICD-10-CM | POA: Diagnosis present

## 2016-11-29 DIAGNOSIS — I4510 Unspecified right bundle-branch block: Secondary | ICD-10-CM | POA: Diagnosis present

## 2016-11-29 DIAGNOSIS — R0602 Shortness of breath: Secondary | ICD-10-CM

## 2016-11-29 DIAGNOSIS — R262 Difficulty in walking, not elsewhere classified: Secondary | ICD-10-CM

## 2016-11-29 DIAGNOSIS — M6281 Muscle weakness (generalized): Secondary | ICD-10-CM

## 2016-11-29 DIAGNOSIS — E7800 Pure hypercholesterolemia, unspecified: Secondary | ICD-10-CM | POA: Diagnosis present

## 2016-11-29 DIAGNOSIS — M62838 Other muscle spasm: Secondary | ICD-10-CM | POA: Diagnosis present

## 2016-11-29 DIAGNOSIS — T502X5A Adverse effect of carbonic-anhydrase inhibitors, benzothiadiazides and other diuretics, initial encounter: Secondary | ICD-10-CM | POA: Diagnosis present

## 2016-11-29 DIAGNOSIS — N400 Enlarged prostate without lower urinary tract symptoms: Secondary | ICD-10-CM | POA: Diagnosis present

## 2016-11-29 DIAGNOSIS — F329 Major depressive disorder, single episode, unspecified: Secondary | ICD-10-CM | POA: Diagnosis present

## 2016-11-29 DIAGNOSIS — Z885 Allergy status to narcotic agent status: Secondary | ICD-10-CM

## 2016-11-29 DIAGNOSIS — G2581 Restless legs syndrome: Secondary | ICD-10-CM | POA: Diagnosis present

## 2016-11-29 DIAGNOSIS — E876 Hypokalemia: Secondary | ICD-10-CM | POA: Diagnosis present

## 2016-11-29 DIAGNOSIS — E1122 Type 2 diabetes mellitus with diabetic chronic kidney disease: Secondary | ICD-10-CM | POA: Diagnosis present

## 2016-11-29 DIAGNOSIS — Z7901 Long term (current) use of anticoagulants: Secondary | ICD-10-CM

## 2016-11-29 DIAGNOSIS — I5033 Acute on chronic diastolic (congestive) heart failure: Secondary | ICD-10-CM | POA: Diagnosis present

## 2016-11-29 DIAGNOSIS — Z823 Family history of stroke: Secondary | ICD-10-CM

## 2016-11-29 DIAGNOSIS — I248 Other forms of acute ischemic heart disease: Secondary | ICD-10-CM | POA: Diagnosis present

## 2016-11-29 DIAGNOSIS — J440 Chronic obstructive pulmonary disease with acute lower respiratory infection: Secondary | ICD-10-CM | POA: Diagnosis present

## 2016-11-29 DIAGNOSIS — R627 Adult failure to thrive: Secondary | ICD-10-CM | POA: Diagnosis present

## 2016-11-29 DIAGNOSIS — I129 Hypertensive chronic kidney disease with stage 1 through stage 4 chronic kidney disease, or unspecified chronic kidney disease: Secondary | ICD-10-CM | POA: Diagnosis present

## 2016-11-29 DIAGNOSIS — I130 Hypertensive heart and chronic kidney disease with heart failure and stage 1 through stage 4 chronic kidney disease, or unspecified chronic kidney disease: Principal | ICD-10-CM | POA: Diagnosis present

## 2016-11-29 DIAGNOSIS — R001 Bradycardia, unspecified: Secondary | ICD-10-CM | POA: Diagnosis present

## 2016-11-29 DIAGNOSIS — Z8673 Personal history of transient ischemic attack (TIA), and cerebral infarction without residual deficits: Secondary | ICD-10-CM

## 2016-11-29 DIAGNOSIS — Z87891 Personal history of nicotine dependence: Secondary | ICD-10-CM

## 2016-11-29 DIAGNOSIS — J189 Pneumonia, unspecified organism: Secondary | ICD-10-CM | POA: Diagnosis present

## 2016-11-29 DIAGNOSIS — N184 Chronic kidney disease, stage 4 (severe): Secondary | ICD-10-CM | POA: Diagnosis present

## 2016-11-29 DIAGNOSIS — Z833 Family history of diabetes mellitus: Secondary | ICD-10-CM

## 2016-11-29 DIAGNOSIS — I4891 Unspecified atrial fibrillation: Secondary | ICD-10-CM | POA: Diagnosis present

## 2016-11-29 DIAGNOSIS — J9621 Acute and chronic respiratory failure with hypoxia: Secondary | ICD-10-CM | POA: Diagnosis present

## 2016-11-29 DIAGNOSIS — I341 Nonrheumatic mitral (valve) prolapse: Secondary | ICD-10-CM | POA: Diagnosis present

## 2016-11-29 DIAGNOSIS — Z86010 Personal history of colonic polyps: Secondary | ICD-10-CM

## 2016-11-29 DIAGNOSIS — R531 Weakness: Secondary | ICD-10-CM

## 2016-11-29 DIAGNOSIS — Z66 Do not resuscitate: Secondary | ICD-10-CM | POA: Diagnosis present

## 2016-11-29 DIAGNOSIS — Z8249 Family history of ischemic heart disease and other diseases of the circulatory system: Secondary | ICD-10-CM

## 2016-11-29 DIAGNOSIS — M7989 Other specified soft tissue disorders: Secondary | ICD-10-CM | POA: Diagnosis present

## 2016-11-29 DIAGNOSIS — Z515 Encounter for palliative care: Secondary | ICD-10-CM | POA: Diagnosis not present

## 2016-11-29 DIAGNOSIS — Z8551 Personal history of malignant neoplasm of bladder: Secondary | ICD-10-CM

## 2016-11-29 DIAGNOSIS — Z8546 Personal history of malignant neoplasm of prostate: Secondary | ICD-10-CM

## 2016-11-29 DIAGNOSIS — Z794 Long term (current) use of insulin: Secondary | ICD-10-CM

## 2016-11-29 DIAGNOSIS — M109 Gout, unspecified: Secondary | ICD-10-CM | POA: Diagnosis present

## 2016-11-29 DIAGNOSIS — M1990 Unspecified osteoarthritis, unspecified site: Secondary | ICD-10-CM | POA: Diagnosis present

## 2016-11-29 DIAGNOSIS — Z79899 Other long term (current) drug therapy: Secondary | ICD-10-CM

## 2016-11-29 HISTORY — DX: Unspecified atrial fibrillation: I48.91

## 2016-11-29 LAB — MAGNESIUM: Magnesium: 1.4 mg/dL — ABNORMAL LOW (ref 1.7–2.4)

## 2016-11-29 LAB — BASIC METABOLIC PANEL
Anion gap: 8 (ref 5–15)
BUN: 31 mg/dL — ABNORMAL HIGH (ref 6–20)
CALCIUM: 8.3 mg/dL — AB (ref 8.9–10.3)
CO2: 20 mmol/L — AB (ref 22–32)
CREATININE: 1.95 mg/dL — AB (ref 0.61–1.24)
Chloride: 108 mmol/L (ref 101–111)
GFR, EST AFRICAN AMERICAN: 34 mL/min — AB (ref >60)
GFR, EST NON AFRICAN AMERICAN: 29 mL/min — AB (ref >60)
Glucose, Bld: 117 mg/dL — ABNORMAL HIGH (ref 65–99)
Potassium: 4.4 mmol/L (ref 3.5–5.1)
SODIUM: 136 mmol/L (ref 135–145)

## 2016-11-29 LAB — GLUCOSE, CAPILLARY
Glucose-Capillary: 79 mg/dL (ref 65–99)
Glucose-Capillary: 114 mg/dL — ABNORMAL HIGH (ref 65–99)
Glucose-Capillary: 84 mg/dL (ref 65–99)
Glucose-Capillary: 136 mg/dL — ABNORMAL HIGH (ref 65–99)

## 2016-11-29 LAB — CBC
HEMATOCRIT: 33.4 % — AB (ref 40.0–52.0)
Hemoglobin: 11.4 g/dL — ABNORMAL LOW (ref 13.0–18.0)
MCH: 31.3 pg (ref 26.0–34.0)
MCHC: 34.2 g/dL (ref 32.0–36.0)
MCV: 91.4 fL (ref 80.0–100.0)
PLATELETS: 308 10*3/uL (ref 150–440)
RBC: 3.66 MIL/uL — ABNORMAL LOW (ref 4.40–5.90)
RDW: 14.6 % — AB (ref 11.5–14.5)
WBC: 8.8 10*3/uL (ref 3.8–10.6)

## 2016-11-29 LAB — BRAIN NATRIURETIC PEPTIDE: B NATRIURETIC PEPTIDE 5: 132 pg/mL — AB (ref 0.0–100.0)

## 2016-11-29 LAB — PROCALCITONIN: Procalcitonin: 0.11 ng/mL

## 2016-11-29 LAB — URIC ACID: Uric Acid, Serum: 5 mg/dL (ref 4.4–7.6)

## 2016-11-29 LAB — TROPONIN I: Troponin I: 0.03 ng/mL (ref <0.03)

## 2016-11-29 LAB — STREP PNEUMONIAE URINARY ANTIGEN: Strep Pneumo Urinary Antigen: NEGATIVE

## 2016-11-29 MED ORDER — OXYCODONE-ACETAMINOPHEN 5-325 MG PO TABS
1.0000 | ORAL_TABLET | Freq: Four times a day (QID) | ORAL | Status: DC | PRN
  Administered 2016-11-29 – 2016-11-30 (×3): 1 via ORAL
  Filled 2016-11-29 (×7): qty 1

## 2016-11-29 MED ORDER — ROPINIROLE HCL ER 8 MG PO TB24
8.0000 mg | ORAL_TABLET | Freq: Every day | ORAL | Status: DC
Start: 2016-11-29 — End: 2016-11-30

## 2016-11-29 MED ORDER — PANTOPRAZOLE SODIUM 40 MG PO TBEC
40.0000 mg | DELAYED_RELEASE_TABLET | Freq: Every day | ORAL | Status: DC
Start: 2016-11-29 — End: 2016-12-04
  Administered 2016-11-29 – 2016-12-04 (×6): 40 mg via ORAL
  Filled 2016-11-29 (×6): qty 1

## 2016-11-29 MED ORDER — INSULIN ASPART 100 UNIT/ML ~~LOC~~ SOLN
0.0000 [IU] | Freq: Every day | SUBCUTANEOUS | Status: DC
  Administered 2016-11-30: 21:00:00 2 [IU] via SUBCUTANEOUS
  Administered 2016-12-01 – 2016-12-03 (×2): 3 [IU] via SUBCUTANEOUS
  Filled 2016-11-29 (×2): qty 3
  Filled 2016-11-29: qty 2

## 2016-11-29 MED ORDER — CYANOCOBALAMIN 1000 MCG/ML IJ SOLN
1000.0000 ug | INTRAMUSCULAR | Status: DC
  Administered 2016-11-29: 11:00:00 1000 ug via INTRAMUSCULAR
  Filled 2016-11-29: qty 1

## 2016-11-29 MED ORDER — SODIUM CHLORIDE 0.9 % IV SOLN
INTRAVENOUS | Status: DC
  Administered 2016-11-29: 06:00:00 via INTRAVENOUS

## 2016-11-29 MED ORDER — VANCOMYCIN HCL IN DEXTROSE 1-5 GM/200ML-% IV SOLN
1000.0000 mg | INTRAVENOUS | Status: DC
  Administered 2016-11-29: 1000 mg via INTRAVENOUS
  Filled 2016-11-29: qty 200

## 2016-11-29 MED ORDER — CYCLOBENZAPRINE HCL 10 MG PO TABS
5.0000 mg | ORAL_TABLET | Freq: Every day | ORAL | Status: DC
  Administered 2016-11-29 – 2016-12-03 (×5): 5 mg via ORAL
  Filled 2016-11-29 (×5): qty 1

## 2016-11-29 MED ORDER — LORATADINE 10 MG PO TABS
10.0000 mg | ORAL_TABLET | Freq: Every day | ORAL | Status: DC
Start: 2016-11-29 — End: 2016-12-04
  Administered 2016-11-29 – 2016-12-04 (×6): 10 mg via ORAL
  Filled 2016-11-29 (×6): qty 1

## 2016-11-29 MED ORDER — CEFEPIME-DEXTROSE 2 GM/50ML IV SOLR
2.0000 g | INTRAVENOUS | Status: DC
  Filled 2016-11-29: qty 50

## 2016-11-29 MED ORDER — CEFEPIME-DEXTROSE 2 GM/50ML IV SOLR
1.0000 g | INTRAVENOUS | Status: DC
  Filled 2016-11-29: qty 25

## 2016-11-29 MED ORDER — ALLOPURINOL 100 MG PO TABS
100.0000 mg | ORAL_TABLET | Freq: Every day | ORAL | Status: DC
  Administered 2016-11-29 – 2016-12-04 (×6): 100 mg via ORAL
  Filled 2016-11-29 (×6): qty 1

## 2016-11-29 MED ORDER — DEXTROSE 5 % IV SOLN
2.0000 g | INTRAVENOUS | Status: DC
Start: 2016-11-29 — End: 2016-11-29
  Filled 2016-11-29: qty 2

## 2016-11-29 MED ORDER — DILTIAZEM HCL ER COATED BEADS 120 MG PO CP24
120.0000 mg | ORAL_CAPSULE | Freq: Two times a day (BID) | ORAL | Status: DC
  Administered 2016-11-29: 21:00:00 120 mg via ORAL
  Filled 2016-11-29 (×3): qty 1

## 2016-11-29 MED ORDER — MAGNESIUM SULFATE 2 GM/50ML IV SOLN
2.0000 g | Freq: Once | INTRAVENOUS | Status: AC
  Administered 2016-11-29: 19:00:00 2 g via INTRAVENOUS
  Filled 2016-11-29: qty 50

## 2016-11-29 MED ORDER — FUROSEMIDE 10 MG/ML IJ SOLN
40.0000 mg | Freq: Every day | INTRAMUSCULAR | Status: DC
  Administered 2016-11-29 – 2016-12-02 (×4): 40 mg via INTRAVENOUS
  Filled 2016-11-29 (×4): qty 4

## 2016-11-29 MED ORDER — MIRTAZAPINE 15 MG PO TABS
15.0000 mg | ORAL_TABLET | Freq: Every day | ORAL | Status: DC
  Administered 2016-11-29 – 2016-12-03 (×5): 15 mg via ORAL
  Filled 2016-11-29 (×5): qty 1

## 2016-11-29 MED ORDER — PROPRANOLOL HCL 10 MG PO TABS
10.0000 mg | ORAL_TABLET | Freq: Two times a day (BID) | ORAL | Status: DC
  Administered 2016-11-29: 21:00:00 10 mg via ORAL
  Filled 2016-11-29 (×4): qty 1

## 2016-11-29 MED ORDER — CEFEPIME-DEXTROSE 1 GM/50ML IV SOLR
1.0000 g | Freq: Once | INTRAVENOUS | Status: AC
  Administered 2016-11-29: 1 g via INTRAVENOUS
  Filled 2016-11-29: qty 50

## 2016-11-29 MED ORDER — PREDNISONE 5 MG PO TABS
50.0000 mg | ORAL_TABLET | Freq: Every day | ORAL | Status: DC
  Administered 2016-11-30 – 2016-12-04 (×5): 50 mg via ORAL
  Filled 2016-11-29 (×5): qty 2

## 2016-11-29 MED ORDER — DEXTROSE 5 % IV SOLN
2.0000 g | INTRAVENOUS | Status: DC
  Filled 2016-11-29: qty 2

## 2016-11-29 MED ORDER — CEFEPIME-DEXTROSE 1 GM/50ML IV SOLR
1.0000 g | INTRAVENOUS | Status: DC
  Filled 2016-11-29: qty 50

## 2016-11-29 MED ORDER — SODIUM CHLORIDE 0.9 % IV SOLN
1250.0000 mg | Freq: Once | INTRAVENOUS | Status: DC
  Filled 2016-11-29: qty 1250

## 2016-11-29 MED ORDER — APIXABAN 2.5 MG PO TABS
2.5000 mg | ORAL_TABLET | Freq: Two times a day (BID) | ORAL | Status: DC
Start: 2016-11-29 — End: 2016-12-04
  Administered 2016-11-29 – 2016-12-04 (×11): 2.5 mg via ORAL
  Filled 2016-11-29 (×11): qty 1

## 2016-11-29 MED ORDER — AMLODIPINE BESYLATE 5 MG PO TABS
2.5000 mg | ORAL_TABLET | Freq: Every day | ORAL | Status: DC
  Administered 2016-11-30 – 2016-12-04 (×5): 2.5 mg via ORAL
  Filled 2016-11-29 (×6): qty 1

## 2016-11-29 MED ORDER — ATORVASTATIN CALCIUM 20 MG PO TABS
40.0000 mg | ORAL_TABLET | Freq: Every day | ORAL | Status: DC
  Administered 2016-11-29 – 2016-12-03 (×5): 40 mg via ORAL
  Filled 2016-11-29 (×5): qty 2

## 2016-11-29 MED ORDER — POTASSIUM CHLORIDE CRYS ER 10 MEQ PO TBCR
10.0000 meq | EXTENDED_RELEASE_TABLET | Freq: Every day | ORAL | Status: DC
Start: 2016-11-29 — End: 2016-12-04
  Administered 2016-11-29 – 2016-12-04 (×6): 10 meq via ORAL
  Filled 2016-11-29 (×6): qty 1

## 2016-11-29 MED ORDER — NITROGLYCERIN 2 % TD OINT
1.0000 [in_us] | TOPICAL_OINTMENT | Freq: Once | TRANSDERMAL | Status: AC
  Administered 2016-11-29: 1 [in_us] via TOPICAL
  Filled 2016-11-29: qty 1

## 2016-11-29 MED ORDER — DEXTROSE 5 % IV SOLN: 1.0000 g | Freq: Three times a day (TID) | INTRAVENOUS | Status: DC

## 2016-11-29 MED ORDER — IPRATROPIUM-ALBUTEROL 0.5-2.5 (3) MG/3ML IN SOLN
3.0000 mL | Freq: Once | RESPIRATORY_TRACT | Status: AC
  Administered 2016-11-29: 3 mL via RESPIRATORY_TRACT
  Filled 2016-11-29: qty 3

## 2016-11-29 MED ORDER — VANCOMYCIN HCL IN DEXTROSE 1-5 GM/200ML-% IV SOLN
1000.0000 mg | Freq: Once | INTRAVENOUS | Status: AC
  Administered 2016-11-29: 1000 mg via INTRAVENOUS
  Filled 2016-11-29: qty 200

## 2016-11-29 MED ORDER — MAGNESIUM OXIDE 400 (241.3 MG) MG PO TABS
400.0000 mg | ORAL_TABLET | Freq: Two times a day (BID) | ORAL | Status: AC
Start: 2016-11-29 — End: 2016-11-30
  Administered 2016-11-29 – 2016-11-30 (×2): 400 mg via ORAL
  Filled 2016-11-29 (×2): qty 1

## 2016-11-29 MED ORDER — TAMSULOSIN HCL 0.4 MG PO CAPS
0.4000 mg | ORAL_CAPSULE | Freq: Every day | ORAL | Status: DC
  Administered 2016-11-29 – 2016-12-04 (×6): 0.4 mg via ORAL
  Filled 2016-11-29 (×6): qty 1

## 2016-11-29 MED ORDER — DIAZEPAM 5 MG PO TABS
5.0000 mg | ORAL_TABLET | Freq: Three times a day (TID) | ORAL | Status: DC | PRN
  Administered 2016-11-29 – 2016-12-02 (×4): 5 mg via ORAL
  Filled 2016-11-29 (×4): qty 1

## 2016-11-29 MED ORDER — DILTIAZEM HCL ER COATED BEADS 240 MG PO CP24
240.0000 mg | ORAL_CAPSULE | Freq: Every day | ORAL | Status: DC
Start: 2016-11-29 — End: 2016-11-29
  Filled 2016-11-29: qty 1

## 2016-11-29 MED ORDER — PRIMIDONE 50 MG PO TABS
50.0000 mg | ORAL_TABLET | Freq: Two times a day (BID) | ORAL | Status: DC
  Administered 2016-11-29 – 2016-12-04 (×11): 50 mg via ORAL
  Filled 2016-11-29 (×12): qty 1

## 2016-11-29 MED ORDER — INSULIN ASPART 100 UNIT/ML ~~LOC~~ SOLN
0.0000 [IU] | Freq: Three times a day (TID) | SUBCUTANEOUS | Status: DC
Start: 2016-11-29 — End: 2016-12-04
  Administered 2016-11-30: 2 [IU] via SUBCUTANEOUS
  Administered 2016-11-30: 5 [IU] via SUBCUTANEOUS
  Administered 2016-12-01: 1 [IU] via SUBCUTANEOUS
  Administered 2016-12-01 (×2): 2 [IU] via SUBCUTANEOUS
  Administered 2016-12-02: 12:00:00 3 [IU] via SUBCUTANEOUS
  Administered 2016-12-02: 18:00:00 5 [IU] via SUBCUTANEOUS
  Administered 2016-12-02: 09:00:00 1 [IU] via SUBCUTANEOUS
  Administered 2016-12-03: 2 [IU] via SUBCUTANEOUS
  Administered 2016-12-03: 09:00:00 1 [IU] via SUBCUTANEOUS
  Administered 2016-12-03 – 2016-12-04 (×2): 5 [IU] via SUBCUTANEOUS
  Filled 2016-11-29: qty 2
  Filled 2016-11-29: qty 3
  Filled 2016-11-29: qty 1
  Filled 2016-11-29: qty 5
  Filled 2016-11-29 (×3): qty 2
  Filled 2016-11-29: qty 1
  Filled 2016-11-29 (×3): qty 5
  Filled 2016-11-29: qty 1

## 2016-11-29 MED ORDER — SODIUM CHLORIDE 0.9 % IV SOLN: 1250.0000 mg | Freq: Two times a day (BID) | INTRAVENOUS | Status: DC

## 2016-11-29 NOTE — Consult Note (Signed)
Consultation Note Date: 11/29/2016   Patient Name: Dean Garcia  DOB: Sep 06, 1928  MRN: [id number]  Age / Sex: 82 y.o., male  PCP: Kirk Ruths, MD Referring Physician: Theodoro Grist, MD  Reason for Consultation: Establishing goals of care and Psychosocial/spiritual support  HPI/Patient Profile: 82 y.o. male  admitted on 11/29/2016 with past medical history of atrial fibrillation and diabetes mellitus presents to the emergency department complaining of shortness of breath. He was recently released from the hospital after an episode of acute on chronic renal failure. At that time he was considering dialysis, "If that what it takes"  He had been home and barely gotten set up with home health care and physical therapy when he began feeling short of breath.   He denies fevers, nausea or vomiting. His wife has supplemental oxygen at home for her COPD place him on the patient's face that did not relieve his shortness of breath which prompted him to seek evaluation in the emergency department.   Chest x-ray showed a new left lower lobe infiltrate, however the patient was not hypoxic in the emergency department. He is admitted, started on antibiotics and today is "feeling better"  Considering his overall frailty, slow physical and functional decline he and his wife face continued  advanced directive decisions and anticipatory care needs.   Clinical Assessment and Goals of Care:  This NP Wadie Lessen reviewed medical records, received report from team, assessed the patient and then meet at the patient's bedside along with his wife  to discuss diagnosis, prognosis, GOC, EOL wishes disposition and options.  A  discussion was had today regarding advanced directives.  Concepts specific to code status, artifical feeding and hydration, continued IV antibiotics and rehospitalization was had.  The difference between a  aggressive medical intervention path  and a palliative comfort care path for this patient at this time was had.  Values and goals of care important to patient and family were attempted to be elicited.  Concept of Hospice and Palliative Care were discussed  Natural trajectory and expectations at EOL were discussed.  Questions and concerns addressed.   Family encouraged to call with questions or concerns.  PMT will continue to support holistically.   HCPOA/wife    SUMMARY OF RECOMMENDATIONS    Code Status/Advance Care Planning:  DNR  Treat the treatable, hopeful for improvement.  Patient is hopeful for continued quality life  Recommend Palliative care services at home on discahrge  Palliative Prophylaxis:   Aspiration, Bowel Regimen, Delirium Protocol, Frequent Pain Assessment and Oral Care  Additional Recommendations (Limitations, Scope, Preferences):  Avoid Hospitalization  Psycho-social/Spiritual:   Desire for further Chaplaincy support:no  Prognosis:   Unable to determine  Discharge Planning: Home with Home Health      Primary Diagnoses: Present on Admission: . HCAP (healthcare-associated pneumonia)   I have reviewed the medical record, interviewed the patient and family, and examined the patient. The following aspects are pertinent.  Past Medical History:  Diagnosis Date  . Atrial fibrillation ([place])   .  Bladder cancer ([place])   . Bundle branch block, right   . Carotid stenosis   . Colon polyp   . CVA (cerebral infarction)   . Diabetes ([place])   . Gout   . High cholesterol   . Hypertension   . Mitral valve prolapse   . Prostate cancer ([place])   . Tremor    Social History   Social History  . Marital status: Married    Spouse name: N/A  . Number of children: N/A  . Years of education: N/A   Social History Main Topics  . Smoking status: Former Smoker    Types: Pipe    Quit date: 10/29/2010  . Smokeless tobacco: Never Used  . Alcohol use Yes  .  Drug use: No  . Sexual activity: Not Asked   Other Topics Concern  . None   Social History Narrative  . None   Family History  Problem Relation Age of Onset  . Hypertension Father   . Stroke Father   . Diabetes Mother    Scheduled Meds: . allopurinol  100 mg Oral Daily  . amLODipine  2.5 mg Oral Daily  . apixaban  2.5 mg Oral BID  . atorvastatin  40 mg Oral QHS  . cyanocobalamin  1,000 mcg Intramuscular Q30 days  . cyclobenzaprine  5 mg Oral QHS  . diltiazem  120 mg Oral BID  . furosemide  40 mg Intravenous Daily  . insulin aspart  0-5 Units Subcutaneous QHS  . insulin aspart  0-9 Units Subcutaneous TID WC  . loratadine  10 mg Oral Daily  . mirtazapine  15 mg Oral QHS  . pantoprazole  40 mg Oral Daily  . potassium chloride  10 mEq Oral Daily  . [START ON 11/30/2016] predniSONE  50 mg Oral Q breakfast  . primidone  50 mg Oral BID  . propranolol  10 mg Oral BID  . rOPINIRole  8 mg Oral QHS  . tamsulosin  0.4 mg Oral Daily   Continuous Infusions: PRN Meds:.diazepam, oxyCODONE-acetaminophen Medications Prior to Admission:  Prior to Admission medications   Medication Sig Start Date End Date Taking? Authorizing Provider  allopurinol (ZYLOPRIM) 100 MG tablet Take 100 mg by mouth daily.   Yes Historical Provider, MD  amLODipine (NORVASC) 5 MG tablet Take 2.5 mg by mouth daily.    Yes Historical Provider, MD  apixaban (ELIQUIS) 2.5 MG TABS tablet Take 2.5 mg by mouth 2 (two) times daily.  02/21/16  Yes Historical Provider, MD  atorvastatin (LIPITOR) 40 MG tablet Take 40 mg by mouth at bedtime.   Yes Historical Provider, MD  cyanocobalamin (,VITAMIN B-12,) 1000 MCG/ML injection Inject 1,000 mcg into the muscle every 30 (thirty) days.  10/31/15  Yes Historical Provider, MD  diltiazem (CARDIZEM CD) 240 MG 24 hr capsule Take 1 capsule (240 mg total) by mouth daily. 02/14/16  Yes Lytle Butte, MD  fexofenadine (ALLEGRA) 180 MG tablet Take 180 mg by mouth as needed for allergies or  rhinitis.   Yes Historical Provider, MD  glucose blood (ACCU-CHEK COMPACT PLUS) test strip  12/07/15  Yes Historical Provider, MD  insulin lispro protamine-lispro (HUMALOG 75/25 MIX) (75-25) 100 UNIT/ML SUSP injection Inject 10 Units into the skin daily before supper.   Yes Historical Provider, MD  mirtazapine (REMERON) 15 MG tablet Take 1 tablet (15 mg total) by mouth at bedtime. 10/22/16  Yes Gonzella Lex, MD  omeprazole (PRILOSEC) 20 MG capsule Take 20 mg by mouth daily.  03/09/16 03/09/17 Yes Historical Provider, MD  ondansetron (ZOFRAN-ODT) 4 MG disintegrating tablet Take 4 mg by mouth every 8 (eight) hours as needed for nausea or vomiting.    Yes Historical Provider, MD  potassium chloride (K-DUR) 10 MEQ tablet Take 10 mEq by mouth daily.   Yes Historical Provider, MD  primidone (MYSOLINE) 50 MG tablet Take 50 mg by mouth 2 (two) times daily.    Yes Historical Provider, MD  propranolol (INDERAL) 10 MG tablet Take 10 mg by mouth 2 (two) times daily.    Yes Historical Provider, MD  ranitidine (ZANTAC) 150 MG tablet Take 300 mg by mouth every evening.    Yes Historical Provider, MD  sitaGLIPtin (JANUVIA) 25 MG tablet Take 25 mg by mouth daily.    Yes Historical Provider, MD  tamsulosin (FLOMAX) 0.4 MG CAPS capsule Take 1 capsule (0.4 mg total) by mouth daily. 11/24/16  Yes Demetrios Loll, MD   Allergies  Allergen Reactions  . Demerol [Meperidine] Nausea And Vomiting    Nausea/vomiting   Review of Systems  Constitutional: Positive for fatigue.  Neurological: Positive for weakness.    Physical Exam  Constitutional: He is oriented to person, place, and time. He appears well-developed. He is cooperative. He appears ill.  HENT:  Mouth/Throat: Oropharynx is clear and moist.  Cardiovascular: Normal rate, regular rhythm and normal heart sounds.   Pulmonary/Chest: Effort normal and breath sounds normal.  Neurological: He is alert and oriented to person, place, and time.  Skin: Skin is warm and dry.     Vital Signs: BP (!) 100/35 (BP Location: Right Arm)   Pulse (!) 59   Temp 98.8 F (37.1 C)   Resp 18   Ht 6\' 1"  (1.854 m)   Wt 76.1 kg (167 lb 12.8 oz)   SpO2 97%   BMI 22.14 kg/m  Pain Assessment: 0-10   Pain Score: Asleep   SpO2: SpO2: 97 % O2 Device:SpO2: 97 % O2 Flow Rate: .   IO: Intake/output summary:  Intake/Output Summary (Last 24 hours) at 11/29/16 1414 Last data filed at 11/29/16 0600  Gross per 24 hour  Intake           272.92 ml  Output                0 ml  Net           272.92 ml    LBM:   Baseline Weight: Weight: 75.3 kg (166 lb) Most recent weight: Weight: 76.1 kg (167 lb 12.8 oz)      Palliative Assessment/Data: 30 % currently      Time In: 1315 Time Out: 1430 Time Total: 75 min Greater than 50%  of this time was spent counseling and coordinating care related to the above assessment and plan.  Signed by: Wadie Lessen, NP   Please contact Palliative Medicine Team phone at [phone] for questions and concerns.  For individual provider: See Shea Evans

## 2016-11-29 NOTE — Plan of Care (Signed)
Problem: Pain Managment: Goal: General experience of comfort will improve Outcome: Not Progressing Pt has been having severe cramps in bilateral feet.

## 2016-11-29 NOTE — ED Triage Notes (Signed)
Pt arrived by EMS from home ([place]).  Pt was recently d/c on 11/23/16 from hospital.  EMS states that reason for admission unknown at this time, but believe it to have been for pneumonia.  Pt has indwelling chronic catheter that was placed during last hospitalization.  Pt A&Ox4 at this time.  Dyspnea noted with minimal exertion, such as talking.  Per EMS, pt was 97% on RA upon EMS arrival.

## 2016-11-29 NOTE — Progress Notes (Addendum)
Pharmacy Antibiotic Note  Dean Garcia is a 82 y.o. male admitted on 11/29/2016 with pneumonia.  Pharmacy has been consulted for vancomycin dosing.  Plan: DW 75.3kg  Vd 53L kei 0.028 hr-1  T1/2 25 hours Vancomycin 1000 mg q 24 hours Pt received 1g of vancomycin at 0200. Will give next dose is 6 hours for stacked dosing.  Level before 5th dose. Goal trough 15-20.    Height: 6\' 1"  (185.4 cm) Weight: 166 lb (75.3 kg) IBW/kg (Calculated) : 79.9  Temp (24hrs), Avg:97.8 F (36.6 C), Min:97.7 F (36.5 C), Max:97.8 F (36.6 C)   Recent Labs Lab 11/29/16 0106  WBC 8.8  CREATININE 1.95*    Estimated Creatinine Clearance: 29 mL/min (by C-G formula based on SCr of 1.95 mg/dL (H)).    Allergies  Allergen Reactions  . Demerol [Meperidine] Nausea And Vomiting    Nausea/vomiting    Antimicrobials this admission: vancomycin 12/28 >>  cefepime 12/28 >>   Dose adjustments this admission:   Microbiology results: 12/28 Sputum: pending       12/28 CXR: LLL airspace disease New left lower lobe airspace disease suspicious for pneumonia with  small effusion.     Thank you for allowing pharmacy to be a part of this patient's care.  Ramond Dial, Pharm.D, BCPS Clinical Pharmacist  11/29/2016 6:08 AM

## 2016-11-29 NOTE — ED Provider Notes (Signed)
[facility] Emergency Department Provider Note   ____________________________________________   First MD Initiated Contact with Patient 11/29/16 0134     (approximate)  I have reviewed the triage vital signs and the nursing notes.   HISTORY  Chief Complaint Shortness of Breath    HPI Dean Garcia is a 82 y.o. male who presents to the ED from home via EMS with a chief complaint of shortness of breath. Patient was discharged from the hospital approximately 6 days ago following a stay for dehydration, renal failure and weakness. Reports dyspnea with exertion and shortness of breath today. Wife states patient has been so deconditionedthat he is no longer able to ambulate and sits in a wheelchair daily. Has an indwelling Foley from prior admission. Denies associated fever, chills, chest pain, abdominal pain, nausea, vomiting, diarrhea. Denies recent travel or trauma. Nothing makes his symptoms better. Movement makes his symptoms worse.   Past Medical History:  Diagnosis Date  . Atrial fibrillation ([place])   . Bladder cancer ([place])   . Bundle branch block, right   . Carotid stenosis   . Colon polyp   . CVA (cerebral infarction)   . Diabetes ([place])   . Gout   . High cholesterol   . Hypertension   . Mitral valve prolapse   . Prostate cancer ([place])   . Tremor     Patient Active Problem List   Diagnosis Date Noted  . Pressure injury of skin 11/20/2016  . Palliative care encounter   . Goals of care, counseling/discussion   . Acute renal failure superimposed on chronic kidney disease ([place])   . ARF (acute renal failure) ([place]) 11/19/2016  . Moderate major depression, single episode ([place]) 10/22/2016  . Mild dementia 10/22/2016  . Paroxysmal atrial fibrillation ([place]) 02/21/2016  . A-fib ([place]) 02/12/2016  . Centrilobular emphysema ([place]) 12/27/2015  . B12 deficiency 04/12/2015  . Atherosclerosis of aorta ([place]) 10/23/2014  . 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 Has  a tremor 07/17/2014  . Type 2 diabetes mellitus ([place]) 07/17/2014  . Edema 07/17/2014  . Billowing mitral valve 07/12/2014  . BP (high blood pressure) 07/12/2014  . Carotid artery narrowing 07/12/2014  . Impaired renal function 06/07/2014  . Proctitis, radiation 06/07/2014  . Malignant neoplasm of prostate ([place]) 06/07/2014  . 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 Arthritis, degenerative 06/07/2014  . Disorder of peripheral nervous system ([place]) 06/07/2014  . Gout 06/07/2014  . 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 DD (diverticular disease) 06/07/2014  . Disease of rectum 06/07/2014  . Diabetes mellitus ([place]) 06/07/2014  . Malignant neoplasm of urinary bladder ([place]) 06/07/2014    Past Surgical History:  Procedure Laterality Date  . APPENDECTOMY      Prior to Admission medications   Medication Sig Start Date End Date Taking? Authorizing Provider  allopurinol (ZYLOPRIM) 100 MG tablet Take 100 mg by mouth daily.    Historical Provider, MD  amLODipine (NORVASC) 5 MG tablet Take 2.5 mg by mouth daily.     Historical Provider, MD  apixaban (ELIQUIS) 2.5 MG TABS tablet Take 2.5 mg by mouth 2 (two) times daily.  02/21/16   Historical Provider, MD  atorvastatin (LIPITOR) 40 MG tablet Take 40 mg by mouth at bedtime.    Historical Provider, MD  cyanocobalamin (,VITAMIN B-12,) 1000 MCG/ML injection Inject 1,000 mcg into the muscle every 30 (thirty) days.  10/31/15   Historical Provider, MD  diltiazem (CARDIZEM CD) 240 MG 24 hr capsule Take 1 capsule (240  mg total) by mouth daily. 02/14/16   Lytle Butte, MD  fexofenadine (ALLEGRA) 180 MG tablet Take 180 mg by mouth as needed for allergies or rhinitis.    Historical Provider, MD  glucose blood (ACCU-CHEK COMPACT PLUS) test strip  12/07/15   Historical Provider, MD  insulin lispro protamine-lispro (HUMALOG 75/25 MIX) (75-25) 100 UNIT/ML SUSP injection Inject 10 Units into the skin daily before supper.    Historical  Provider, MD  Insulin Pen Needle (B-D ULTRAFINE III SHORT PEN) 31G X 8 MM MISC  04/28/14   Historical Provider, MD  mirtazapine (REMERON) 15 MG tablet Take 1 tablet (15 mg total) by mouth at bedtime. 10/22/16   Gonzella Lex, MD  omeprazole (PRILOSEC) 20 MG capsule Take 20 mg by mouth daily.  03/09/16 03/09/17  Historical Provider, MD  ondansetron (ZOFRAN-ODT) 4 MG disintegrating tablet Take 4 mg by mouth every 8 (eight) hours as needed for nausea or vomiting.     Historical Provider, MD  potassium chloride (K-DUR) 10 MEQ tablet Take 10 mEq by mouth daily.    Historical Provider, MD  primidone (MYSOLINE) 50 MG tablet Take 50 mg by mouth 2 (two) times daily.     Historical Provider, MD  propranolol (INDERAL) 10 MG tablet Take 10 mg by mouth 2 (two) times daily.     Historical Provider, MD  ranitidine (ZANTAC) 150 MG tablet Take 300 mg by mouth every evening.     Historical Provider, MD  sitaGLIPtin (JANUVIA) 25 MG tablet Take 25 mg by mouth daily.     Historical Provider, MD  tamsulosin (FLOMAX) 0.4 MG CAPS capsule Take 1 capsule (0.4 mg total) by mouth daily. 11/24/16   Demetrios Loll, MD    Allergies Demerol [meperidine]  Family History  Problem Relation Age of Onset  . Hypertension Father   . Stroke Father   . Diabetes Mother     Social History Social History  Substance Use Topics  . Smoking status: Former Smoker    Types: Pipe    Quit date: 10/29/2010  . Smokeless tobacco: Never Used  . Alcohol use Yes    Review of Systems  Constitutional: Positive for generalized weakness. No fever/chills. Eyes: No visual changes. ENT: No sore throat. Cardiovascular: Denies chest pain. Respiratory: Positive for shortness of breath. Gastrointestinal: No abdominal pain.  No nausea, no vomiting.  No diarrhea.  No constipation. Genitourinary: Negative for dysuria. Musculoskeletal: Negative for back pain. Skin: Negative for rash. Neurological: Negative for headaches, focal weakness or  numbness.  10-point ROS otherwise negative.  ____________________________________________   PHYSICAL EXAM:  VITAL SIGNS: ED Triage Vitals  Enc Vitals Group     BP 11/29/16 0103 (!) 194/82     Pulse Rate 11/29/16 0103 68     Resp 11/29/16 0103 (!) 27     Temp 11/29/16 0103 97.7 F (36.5 C)     Temp Source 11/29/16 0103 Oral     SpO2 11/29/16 0103 100 %     Weight 11/29/16 0104 166 lb (75.3 kg)     Height 11/29/16 0104 6\' 1"  (1.854 m)     Head Circumference --      Peak Flow --      Pain Score --      Pain Loc --      Pain Edu? --      Excl. in [place]? --     Constitutional: Alert and oriented. Chronically ill appearing and in no acute distress. Eyes: Conjunctivae are normal. PERRL.  EOMI. Head: Atraumatic. Nose: No congestion/rhinnorhea. Mouth/Throat: Mucous membranes are moist.  Oropharynx non-erythematous. Neck: No stridor.   Cardiovascular: Normal rate, regular rhythm. Grossly normal heart sounds.  Good peripheral circulation. Respiratory: Increased respiratory effort.  No retractions. Lungs with scattered rhonchi. Gastrointestinal: Soft and nontender. No distention. No abdominal bruits. No CVA tenderness. Genitourinary: Indwelling Foley in place. Musculoskeletal: No lower extremity tenderness. 1+ BLE pitting edema.  No joint effusions. Neurologic:  Normal speech and language. No gross focal neurologic deficits are appreciated.  Skin:  Skin is warm, dry and intact. No rash noted. Psychiatric: Mood and affect are normal. Speech and behavior are normal.  ____________________________________________   LABS (all labs ordered are listed, but only abnormal results are displayed)  Labs Reviewed  CBC - Abnormal; Notable for the following:       Result Value   RBC 3.66 (*)    Hemoglobin 11.4 (*)    HCT 33.4 (*)    RDW 14.6 (*)    All other components within normal limits  BASIC METABOLIC PANEL - Abnormal; Notable for the following:    CO2 20 (*)    Glucose, Bld 117 (*)     BUN 31 (*)    Creatinine, Ser 1.95 (*)    Calcium 8.3 (*)    GFR calc non Af Amer 29 (*)    GFR calc Af Amer 34 (*)    All other components within normal limits  TROPONIN I - Abnormal; Notable for the following:    Troponin I 0.03 (*)    All other components within normal limits  BRAIN NATRIURETIC PEPTIDE - Abnormal; Notable for the following:    B Natriuretic Peptide 132.0 (*)    All other components within normal limits   ____________________________________________  EKG  ED ECG REPORT I, Ethel Meisenheimer J, the attending physician, personally viewed and interpreted this ECG.   Date: 11/29/2016  EKG Time: 0059  Rate: 73  Rhythm: normal EKG, normal sinus rhythm  Axis: Normal  Intervals:none  ST&T Change: Nonspecific  ____________________________________________  RADIOLOGY  Chest 2 view (view by me, interpreted per Dr. Marisue Humble):  New left lower lobe airspace disease suspicious for pneumonia with  small effusion.   ____________________________________________   PROCEDURES  Procedure(s) performed: None  Procedures  Critical Care performed: No  ____________________________________________   INITIAL IMPRESSION / ASSESSMENT AND PLAN / ED COURSE  Pertinent labs & imaging results that were available during my care of the patient were reviewed by me and considered in my medical decision making (see chart for details).  82 year old male who presents with shortness of breath some: Recently discharged from hospital. Now with new pneumonia. Will treat with IV antibiotics for HCAP and discuss with hospitalist to evaluate patient in the emergency department for admission.  Clinical Course      ____________________________________________   FINAL CLINICAL IMPRESSION(S) / ED DIAGNOSES  Final diagnoses:  SOB (shortness of breath)  Weakness  HCAP (healthcare-associated pneumonia)      NEW MEDICATIONS STARTED DURING THIS VISIT:  New Prescriptions   No  medications on file     Note:  This document was prepared using Dragon voice recognition software and may include unintentional dictation errors.    Paulette Blanch, MD 11/29/16 [phone]

## 2016-11-29 NOTE — Progress Notes (Signed)
CMRN Joni Reining made aware that this patient has a pending OUTPATIENT Palliative referral. Thank you. Flo Shanks RN, BSN, [place] [facility] [place], [facility] [phone] c

## 2016-11-29 NOTE — ED Notes (Signed)
Pt in x-ray via stretcher.   Family provided coffee.

## 2016-11-29 NOTE — Progress Notes (Signed)
[facility] [place] NAME: Dean Garcia    MR#:  [id number]  DATE OF BIRTH:  04-30-1928  SUBJECTIVE:  CHIEF COMPLAINT:   Chief Complaint  Patient presents with  . Shortness of Breath    Th patient is a 82 year old male wit past medical history significant for history of atrial fibrillation, diabetes mellitus, who presents to the hospital with complaints of shortness of breath.  He apparentl was recently admitted to the hospital for acute on chronic renal failue, receied IV flids, came back home, initiated physical therapy, and started feelig short of breath. On arrival to emergency room. At [facility] time He was noted to have left lower lobe atelectass versus infiltrate. The patient denies any cough or phlegm production.   Procalcitonin level was low, antibiotics are being discontinued  Review of Systems  Constitutional: Negative for chills, fever and weight loss.  HENT: Negative for congestion.   Eyes: Negative for blurred vision and double vision.  Respiratory: Positive for shortness of breath. Negative for cough, sputum production and wheezing.   Cardiovascular: Negative for chest pain, palpitations, orthopnea, leg swelling and PND.  Gastrointestinal: Negative for abdominal pain, blood in stool, constipation, diarrhea, nausea and vomiting.  Genitourinary: Negative for dysuria, frequency, hematuria and urgency.  Musculoskeletal: Negative for falls.  Neurological: Negative for dizziness, tremors, focal weakness and headaches.  Endo/Heme/Allergies: Does not bruise/bleed easily.  Psychiatric/Behavioral: Negative for depression. The patient does not have insomnia.     VITAL SIGNS: Blood pressure (!) 100/35, pulse (!) 59, temperature 98.8 F (37.1 C), resp. rate 18, height 6\' 1"  (1.854 m), weight 76.1 kg (167 lb 12.8 oz), SpO2 97 %.  PHYSICAL EXAMINATION:   GENERAL:  82 y.o.-year-old patient lying in the bed with no acute distress.  EYES:  Pupils equal, round, reactive to light and accommodation. No scleral icterus. Extraocular muscles intact.  HEENT: Head atraumatic, normocephalic. Oropharynx and nasopharynx clear.  NECK:  Supple, no jugular venous distention. No thyroid enlargement, no tenderness.  LUNGS:   some Dimiished breath sounds bilaterally at bases, no wheezing, rales,rhonchi  , but bilateral basilar crepitations. Not use of accessory muscles of respiration.  CARDIOVASCULAR: S1, S2 normal. No murmurs, rubs, or gallops.  ABDOMEN: Soft, nontender, nondistended. Bowel sounds present. No organomegaly or mass.  EXTREMITIES:  2+ lower extremity and pedal edema,  No cyanosis, or clubbing.  Right metacarpophlangeal joint is inflamed , red, tender to touch, increased warmth was noted NEUROLOGIC: Cranial nerves II through XII are intact. Muscle strength 5/5 in all extremities. Sensation intact. Gait not checked.  PSYCHIATRIC: The patient is alert and oriented x 3.  SKIN: No obvious rash, lesion, or ulcer.   ORDERS/RESULTS REVIEWED:   CBC  Recent Labs Lab 11/29/16 0106  WBC 8.8  HGB 11.4*  HCT 33.4*  PLT 308  MCV 91.4  MCH 31.3  MCHC 34.2  RDW 14.6*   ------------------------------------------------------------------------------------------------------------------  Chemistries   Recent Labs Lab 11/29/16 0106  NA 136  K 4.4  CL 108  CO2 20*  GLUCOSE 117*  BUN 31*  CREATININE 1.95*  CALCIUM 8.3*   ------------------------------------------------------------------------------------------------------------------ estimated creatinine clearance is 29.3 mL/min (by C-G formula based on SCr of 1.95 mg/dL (H)). ------------------------------------------------------------------------------------------------------------------ No results for input(s): TSH, T4TOTAL, T3FREE, THYROIDAB in the last 72 hours.  Invalid input(s): FREET3  Cardiac Enzymes  Recent Labs Lab 11/29/16 0109  TROPONINI 0.03*    ------------------------------------------------------------------------------------------------------------------ Invalid input(s): POCBNP ---------------------------------------------------------------------------------------------------------------  RADIOLOGY: Dg Chest 2 View  Result  Date: 11/29/2016 CLINICAL DATA:  Shortness of breath. EXAM: CHEST  2 VIEW COMPARISON:  Radiographs 11/19/2016 FINDINGS: New left lower lobe airspace disease posteriorly suspicious for pneumonia. Possible small left pleural effusion. The right lung is clear. Calcified granulomas in the left lung. Unchanged heart size and mediastinal contours. Hilar prominence secondary to vascular structures as seen on chest CT January 2017. No pneumothorax. No acute osseous abnormality is seen. There are old left-sided rib fractures. IMPRESSION: New left lower lobe airspace disease suspicious for pneumonia with small effusion. Electronically Signed   By: Jeb Levering M.D.   On: 11/29/2016 01:42    EKG:  Orders placed or performed during the hospital encounter of 11/29/16  . EKG 12-Lead  . EKG 12-Lead  . ED EKG  . ED EKG    ASSESSMENT AND PLAN:  Active Problems:   HCAP (healthcare-associated pneumonia)   #1. Acute on chronic respiratory failure with hypoxia , likely due to CHF /fluid overload  disontine IV flids ,initiate Lasix orally oraly.  Procalcitonin level was low, dicontinuing antibiotics , follow clinically  #2  Chronic renal failure , CK D stage III, has imprved with IV fluidadinistrtion during prior admission, follow with diuresis  #3 elevated troponin, likely demand ischemia, follow troponin, get cardiologist if needed.  #4 . Right hand swelling, clinically concerning for gout exacerbation, uric acid level was normal, try prednisone orally, follow clinically  Management plans discussed with the patient, family and they are in agreement.   DRUG ALLERGIES:  Allergies  Allergen Reactions  . Demerol  [Meperidine] Nausea And Vomiting    Nausea/vomiting    CODE STATUS:     Code Status Orders        Start     Ordered   11/29/16 0516  Do not attempt resuscitation (DNR)  Continuous    Question Answer Comment  In the event of cardiac or respiratory ARREST Do not call a "code blue"   In the event of cardiac or respiratory ARREST Do not perform Intubation, CPR, defibrillation or ACLS   In the event of cardiac or respiratory ARREST Use medication by any route, position, wound care, and other measures to relive pain and suffering. May use oxygen, suction and manual treatment of airway obstruction as needed for comfort.      11/29/16 0516    Code Status History    Date Active Date Inactive Code Status Order ID Comments User Context   11/19/2016  7:00 PM 11/23/2016  8:20 PM DNR [id number]  Gladstone Lighter, MD Inpatient   02/12/2016  8:30 PM 02/14/2016  6:23 PM DNR [id number]  Hillary Bow, MD ED    Advance Directive Documentation   Flowsheet Row Most Recent Value  Type of Advance Directive  Healthcare Power of Attorney  Pre-existing out of facility DNR order (yellow form or pink MOST form)  No data  "MOST" Form in Place?  No data      TOTAL TIME TAKING CARE OF THIS PATIENT: 40 minutes.    Theodoro Grist M.D on 11/29/2016 at 1:18 PM  Between 7am to 6pm - Pager - [phone]  After 6pm go to [email or web address] - password [email or web address] [facility]  Office  [phone]  CC: Primary care physician; Kirk Ruths., MD

## 2016-11-29 NOTE — Care Management (Signed)
Informed that patient is actually open to [facility].  Palliative care did not get to consult patient in the home before he was readmitted

## 2016-11-29 NOTE — H&P (Signed)
Dean Garcia is an 82 y.o. male.   Chief Complaint: Shortness of breath HPI: The patient with past medical history of atrial fibrillation and diabetes mellitus presents to the emergency department complaining of shortness of breath. He was recently released from the hospital after an episode of acute on chronic renal failure. He had been home and barely gotten set up with home health care and physical therapy when he began feeling short of breath. He denies fevers, nausea or vomiting. His wife has supplemental oxygen at home for her COPD place him on the patient's face that did not relieve his shortness of breath which prompted him to seek evaluation in the emergency department. Chest x-ray showed a new left lower lobe infiltrate, however the patient was not hypoxic in the emergency department. After starting antibiotics he seemed to feel better on room air. However, due to his age, recent hospitalization and overall deconditioning the emergency department staff called the hospitalist service for admission.  Past Medical History:  Diagnosis Date  . Atrial fibrillation ([place])   . Bladder cancer ([place])   . Bundle branch block, right   . Carotid stenosis   . Colon polyp   . CVA (cerebral infarction)   . Diabetes ([place])   . Gout   . High cholesterol   . Hypertension   . Mitral valve prolapse   . Prostate cancer ([place])   . Tremor     Past Surgical History:  Procedure Laterality Date  . APPENDECTOMY      Family History  Problem Relation Age of Onset  . Hypertension Father   . Stroke Father   . Diabetes Mother    Social History:  reports that he quit smoking about 6 years ago. His smoking use included Pipe. He has never used smokeless tobacco. He reports that he drinks alcohol. He reports that he does not use drugs.  Allergies:  Allergies  Allergen Reactions  . Demerol [Meperidine] Nausea And Vomiting    Nausea/vomiting    Medications Prior to Admission  Medication Sig Dispense Refill   . allopurinol (ZYLOPRIM) 100 MG tablet Take 100 mg by mouth daily.    Marland Kitchen amLODipine (NORVASC) 5 MG tablet Take 2.5 mg by mouth daily.     Marland Kitchen apixaban (ELIQUIS) 2.5 MG TABS tablet Take 2.5 mg by mouth 2 (two) times daily.     Marland Kitchen atorvastatin (LIPITOR) 40 MG tablet Take 40 mg by mouth at bedtime.    . cyanocobalamin (,VITAMIN B-12,) 1000 MCG/ML injection Inject 1,000 mcg into the muscle every 30 (thirty) days.     Marland Kitchen diltiazem (CARDIZEM CD) 240 MG 24 hr capsule Take 1 capsule (240 mg total) by mouth daily. 30 capsule 0  . fexofenadine (ALLEGRA) 180 MG tablet Take 180 mg by mouth as needed for allergies or rhinitis.    Marland Kitchen glucose blood (ACCU-CHEK COMPACT PLUS) test strip     . insulin lispro protamine-lispro (HUMALOG 75/25 MIX) (75-25) 100 UNIT/ML SUSP injection Inject 10 Units into the skin daily before supper.    . mirtazapine (REMERON) 15 MG tablet Take 1 tablet (15 mg total) by mouth at bedtime. 30 tablet 0  . omeprazole (PRILOSEC) 20 MG capsule Take 20 mg by mouth daily.     . ondansetron (ZOFRAN-ODT) 4 MG disintegrating tablet Take 4 mg by mouth every 8 (eight) hours as needed for nausea or vomiting.     . potassium chloride (K-DUR) 10 MEQ tablet Take 10 mEq by mouth daily.    . primidone (  MYSOLINE) 50 MG tablet Take 50 mg by mouth 2 (two) times daily.     . propranolol (INDERAL) 10 MG tablet Take 10 mg by mouth 2 (two) times daily.     . ranitidine (ZANTAC) 150 MG tablet Take 300 mg by mouth every evening.     . sitaGLIPtin (JANUVIA) 25 MG tablet Take 25 mg by mouth daily.     . tamsulosin (FLOMAX) 0.4 MG CAPS capsule Take 1 capsule (0.4 mg total) by mouth daily. 30 capsule 0    Results for orders placed or performed during the hospital encounter of 11/29/16 (from the past 48 hour(s))  CBC     Status: Abnormal   Collection Time: 11/29/16  1:06 AM  Result Value Ref Range   WBC 8.8 3.8 - 10.6 K/uL   RBC 3.66 (L) 4.40 - 5.90 MIL/uL   Hemoglobin 11.4 (L) 13.0 - 18.0 g/dL   HCT 33.4 (L) 40.0 -  52.0 %   MCV 91.4 80.0 - 100.0 fL   MCH 31.3 26.0 - 34.0 pg   MCHC 34.2 32.0 - 36.0 g/dL   RDW 14.6 (H) 11.5 - 14.5 %   Platelets 308 150 - 440 K/uL  Basic metabolic panel     Status: Abnormal   Collection Time: 11/29/16  1:06 AM  Result Value Ref Range   Sodium 136 135 - 145 mmol/L   Potassium 4.4 3.5 - 5.1 mmol/L   Chloride 108 101 - 111 mmol/L   CO2 20 (L) 22 - 32 mmol/L   Glucose, Bld 117 (H) 65 - 99 mg/dL   BUN 31 (H) 6 - 20 mg/dL   Creatinine, Ser 1.95 (H) 0.61 - 1.24 mg/dL   Calcium 8.3 (L) 8.9 - 10.3 mg/dL   GFR calc non Af Amer 29 (L) >60 mL/min   GFR calc Af Amer 34 (L) >60 mL/min    Comment: (NOTE) The eGFR has been calculated using the CKD EPI equation. This calculation has not been validated in all clinical situations. eGFR's persistently <60 mL/min signify possible Chronic Kidney Disease.    Anion gap 8 5 - 15  Brain natriuretic peptide     Status: Abnormal   Collection Time: 11/29/16  1:06 AM  Result Value Ref Range   B Natriuretic Peptide 132.0 (H) 0.0 - 100.0 pg/mL  Troponin I     Status: Abnormal   Collection Time: 11/29/16  1:09 AM  Result Value Ref Range   Troponin I 0.03 (HH) <0.03 ng/mL    Comment: CRITICAL RESULT CALLED TO, READ BACK BY AND VERIFIED WITH IRIS GUIDRY RN AT 0200 11/29/16 MSS.    Dg Chest 2 View  Result Date: 11/29/2016 CLINICAL DATA:  Shortness of breath. EXAM: CHEST  2 VIEW COMPARISON:  Radiographs 11/19/2016 FINDINGS: New left lower lobe airspace disease posteriorly suspicious for pneumonia. Possible small left pleural effusion. The right lung is clear. Calcified granulomas in the left lung. Unchanged heart size and mediastinal contours. Hilar prominence secondary to vascular structures as seen on chest CT January 2017. No pneumothorax. No acute osseous abnormality is seen. There are old left-sided rib fractures. IMPRESSION: New left lower lobe airspace disease suspicious for pneumonia with small effusion. Electronically Signed   By:  Jeb Levering M.D.   On: 11/29/2016 01:42    Review of Systems  Constitutional: Negative for chills and fever.  HENT: Negative for sore throat and tinnitus.   Eyes: Negative for blurred vision and redness.  Respiratory: Positive for shortness of breath (  resolved ). Negative for cough.   Cardiovascular: Negative for chest pain, palpitations, orthopnea and PND.  Gastrointestinal: Negative for abdominal pain, diarrhea, nausea and vomiting.  Genitourinary: Negative for dysuria, frequency and urgency.  Musculoskeletal: Positive for myalgias. Negative for joint pain.  Skin: Negative for rash.       No lesions  Neurological: Negative for speech change, focal weakness and weakness.  Endo/Heme/Allergies: Does not bruise/bleed easily.       No temperature intolerance  Psychiatric/Behavioral: Negative for depression and suicidal ideas.    Blood pressure (!) 130/50, pulse 63, temperature 97.8 F (36.6 C), temperature source Oral, resp. rate 17, height _0  (1.854 m), weight 76.1 kg (167 lb 12.8 oz), SpO2 100 %. Physical Exam  Constitutional: He is oriented to person, place, and time. He appears well-developed and well-nourished. He appears distressed.  HENT:  Head: Normocephalic and atraumatic.  Mouth/Throat: Oropharynx is clear and moist.  Eyes: Conjunctivae and EOM are normal. Pupils are equal, round, and reactive to light. No scleral icterus.  Neck: Normal range of motion. Neck supple. No JVD present. No tracheal deviation present. No thyromegaly present.  Cardiovascular: Normal rate, regular rhythm and normal heart sounds.  Exam reveals no gallop and no friction rub.   No murmur heard. Respiratory: Effort normal and breath sounds normal. No respiratory distress.  GI: Soft. Bowel sounds are normal. He exhibits no distension. There is no tenderness.  Genitourinary:  Genitourinary Comments: Deferred  Musculoskeletal: Normal range of motion. He exhibits no edema.  Lymphadenopathy:    He  has no cervical adenopathy.  Neurological: He is alert and oriented to person, place, and time. No cranial nerve deficit.  Skin: Skin is warm and dry. No rash noted. No erythema.  Psychiatric: He has a normal mood and affect. His behavior is normal. Judgment and thought content normal.     Assessment/Plan This is an 82 year old male admitted for pneumonia. 1. Pneumonia: Hospital-acquired; no hypoxia or signs or symptoms of sepsis. However, prognosis is complicated by overall deconditioning as well as chronic kidney disease and advanced age. The patient has been started on cefepime and vancomycin which we will continue. 2. Chronic kidney disease: Stage IV; avoid nephrotoxic agents. Hydrate with intravenous fluid 3. Atrial fibrillation: Rate controlled; continue Cardizem and oral anticoagulation 4. Essential hypertension: Continue amlodipine and propranolol 5. Diabetes mellitus type 2: Hold mixed insulin while hospitalized. Continue basal insulin dosing as well as sliding scale. 6. Muscle spasms: Continue primidone. Oxycodone for pain. I have also added Requip and Flexeril for symptomatic relief 7. Failure to thrive: Multifactorial; continue Remeron 8. DVT prophylaxis: As above 9. GI prophylaxis: PPI per home regimen The patient is a DO NOT RESUSCITATE. Time spent on admission orders and patient care proximally 45 minutes  Harrie Foreman, MD 11/29/2016, 7:18 AM

## 2016-11-29 NOTE — Care Management (Signed)
Patient presented from [facility] independent living level of care with shortness of breath.  O2 sats 97% on room air upon ems arrival at his home. Was recently discharged from [facility] 12/28. There was a palliative care consult made at that time for patient to be followed in the home.  Patient was receiving outpatient physical therapy at [facility] and he has a chronic foley. Notified Craige Cotta with palliative care agency.  Patient does have dnr in place.

## 2016-11-29 NOTE — Progress Notes (Signed)
Due to pt renal function, cefepime will be reduced from 2g q 24hr to 1g q 24hr.  Ramond Dial, Pharm.D, BCPS Clinical Pharmacist

## 2016-11-30 DIAGNOSIS — M6281 Muscle weakness (generalized): Secondary | ICD-10-CM

## 2016-11-30 DIAGNOSIS — R0602 Shortness of breath: Secondary | ICD-10-CM

## 2016-11-30 DIAGNOSIS — Z515 Encounter for palliative care: Secondary | ICD-10-CM

## 2016-11-30 LAB — BASIC METABOLIC PANEL
Anion gap: 7 (ref 5–15)
BUN: 26 mg/dL — AB (ref 6–20)
CALCIUM: 8.3 mg/dL — AB (ref 8.9–10.3)
CHLORIDE: 109 mmol/L (ref 101–111)
CO2: 22 mmol/L (ref 22–32)
CREATININE: 1.84 mg/dL — AB (ref 0.61–1.24)
GFR, EST AFRICAN AMERICAN: 37 mL/min — AB (ref >60)
GFR, EST NON AFRICAN AMERICAN: 32 mL/min — AB (ref >60)
Glucose, Bld: 192 mg/dL — ABNORMAL HIGH (ref 65–99)
Potassium: 3.7 mmol/L (ref 3.5–5.1)
SODIUM: 138 mmol/L (ref 135–145)

## 2016-11-30 LAB — GLUCOSE, CAPILLARY
Glucose-Capillary: 83 mg/dL (ref 65–99)
Glucose-Capillary: 186 mg/dL — ABNORMAL HIGH (ref 65–99)
Glucose-Capillary: 253 mg/dL — ABNORMAL HIGH (ref 65–99)
Glucose-Capillary: 247 mg/dL — ABNORMAL HIGH (ref 65–99)
Glucose-Capillary: 274 mg/dL — ABNORMAL HIGH (ref 65–99)

## 2016-11-30 LAB — TROPONIN I

## 2016-11-30 MED ORDER — ROPINIROLE HCL 1 MG PO TABS
2.0000 mg | ORAL_TABLET | Freq: Two times a day (BID) | ORAL | Status: DC
  Administered 2016-11-30 – 2016-12-04 (×9): 2 mg via ORAL
  Filled 2016-11-30 (×9): qty 2

## 2016-11-30 NOTE — Consult Note (Signed)
Reason for Consult: Swollen right hand  Referring Physician: Dr. Ignatius Specking   HPI: 82 year old white male. History of diabetes and renal insufficiency. Prior prostate cancer. Prior history of polyarticular gout. Was diagnosed with arthrocentesis. Has been on allopurinol 100 mg. Recent hospitalization for acute renal insufficiency. Improved. Then returned with episode of shortness of breath. Peripheral edema. Around that time 3 days ago he had swelling the left second MCP. Tender to touch. No other joints have been swollen. Has known osteo- arthritis. No fever Last creatinine at 1.884. Uric acid 5. He is on IV Lasix. Was given prednisone dose today.  PMH: Diabetes. Renal insufficiency. Prostate cancer. Osteoarthritis.  SURGICAL HISTORY: No joint surgeries  Family History: Negative for connective tissue disease  Social History: No significant cigarettes or alcohol  Allergies:  Allergies  Allergen Reactions  . Demerol [Meperidine] Nausea And Vomiting    Nausea/vomiting    Medications:  Scheduled: . allopurinol  100 mg Oral Daily  . amLODipine  2.5 mg Oral Daily  . apixaban  2.5 mg Oral BID  . atorvastatin  40 mg Oral QHS  . cyanocobalamin  1,000 mcg Intramuscular Q30 days  . cyclobenzaprine  5 mg Oral QHS  . diltiazem  120 mg Oral BID  . furosemide  40 mg Intravenous Daily  . insulin aspart  0-5 Units Subcutaneous QHS  . insulin aspart  0-9 Units Subcutaneous TID WC  . loratadine  10 mg Oral Daily  . mirtazapine  15 mg Oral QHS  . pantoprazole  40 mg Oral Daily  . potassium chloride  10 mEq Oral Daily  . predniSONE  50 mg Oral Q breakfast  . primidone  50 mg Oral BID  . propranolol  10 mg Oral BID  . rOPINIRole  2 mg Oral BID  . tamsulosin  0.4 mg Oral Daily        ROS:No fever. No rash. No GI upset. Ankles have been swelling.   PHYSICAL EXAM: Blood pressure (!) 120/53, pulse (!) 53, temperature 97.8 F (36.6 C), resp. rate 20, height 6\' 1"  (1.854 m), weight  76.1 kg (167 lb 12.8 oz), SpO2 99 %. Pleasant male. Sclera clear. Distant lung sounds. No significant murmur. Nontender abdomen. 2+ edema Musculoskeletal mild decreased range of motion of cervical spine. Shoulders moved well. Elbows without synovitis or nodules. There is tenderness and erythema around the right second  MCP. PIPs and DIPs are hypertrophic. Negative Tinel's. Hips move well. No knee effusions. MTPs nontender  Assessment: Tenosynovitis of the right second MCP. Suspect crystalline. In the setting of recent renal insufficiency and diuretics. Doubt infection Renal insufficiency Recent shortness or bladder. Getting IV Lasix   Recommendations: Not enough indication for arthrocentesis. Would give prednisone orally a little more time. Suspect he'll have an improvement. No indication for antibiotic. If extremely painful we can apply finger splint. Same dose allopurinol for now. Could recheck uric acid as an outpatient  Dean Garcia 11/30/2016, 2:05 PM

## 2016-11-30 NOTE — Care Management Important Message (Signed)
Important Message  Patient Details  Name: Dean Garcia MRN: [id number] Date of Birth: 1928-11-08   Medicare Important Message Given:  Yes    Katrina Stack, RN 11/30/2016, 8:12 AM

## 2016-11-30 NOTE — Progress Notes (Signed)
[facility] [place] NAME: Dean Garcia    MR#:  [id number]  DATE OF BIRTH:  09-Dec-1927  SUBJECTIVE:  CHIEF COMPLAINT:   Chief Complaint  Patient presents with  . Shortness of Breath    Th patient is a 82 year old male wit past medical history significant for history of atrial fibrillation, diabetes mellitus, who presents to the hospital with complaints of shortness of breath.  He apparentl was recently admitted to the hospital for acute on chronic renal failue, receied IV flids, came back home, initiated physical therapy, and started feelig short of breath. On arrival to emergency room. At [facility] time He was noted to have left lower lobe atelectass versus infiltrate. The patient denies any cough or phlegm production.   Procalcitonin level was low, antibiotics were discontinued. Patient feels better today, less short of breath with Lasix intravenously. Kidney function has improved with Lasix., No cough, no phlegm production. No fevers, MAXIMUM TEMPERATURE is 99.2.  Review of Systems  Constitutional: Negative for chills, fever and weight loss.  HENT: Negative for congestion.   Eyes: Negative for blurred vision and double vision.  Respiratory: Positive for shortness of breath. Negative for cough, sputum production and wheezing.   Cardiovascular: Negative for chest pain, palpitations, orthopnea, leg swelling and PND.  Gastrointestinal: Negative for abdominal pain, blood in stool, constipation, diarrhea, nausea and vomiting.  Genitourinary: Negative for dysuria, frequency, hematuria and urgency.  Musculoskeletal: Negative for falls.  Neurological: Negative for dizziness, tremors, focal weakness and headaches.  Endo/Heme/Allergies: Does not bruise/bleed easily.  Psychiatric/Behavioral: Negative for depression. The patient does not have insomnia.     VITAL SIGNS: Blood pressure (!) 120/53, pulse (!) 53, temperature 97.8 F (36.6 C), resp. rate 20,  height 6\' 1"  (1.854 m), weight 76.1 kg (167 lb 12.8 oz), SpO2 99 %.  PHYSICAL EXAMINATION:   GENERAL:  82 y.o.-year-old patient lying in the bed with no acute distress.  EYES: Pupils equal, round, reactive to light and accommodation. No scleral icterus. Extraocular muscles intact.  HEENT: Head atraumatic, normocephalic. Oropharynx and nasopharynx clear.  NECK:  Supple, no jugular venous distention. No thyroid enlargement, no tenderness.  LUNGS:   some Dimiished breath sounds bilaterally at bases, no wheezing, rales,rhonchi  , but bilateral basilar crepitations. Not use of accessory muscles of respiration.  CARDIOVASCULAR: S1, S2 normal. No murmurs, rubs, or gallops.  ABDOMEN: Soft, nontender, nondistended. Bowel sounds present. No organomegaly or mass.  EXTREMITIES:  1+ lower extremity and pedal edema,  No cyanosis, or clubbing.  Right metacarpophlangeal joint is inflamed , pink, tender to touch, increased warmth was noted, range of motion is decreased NEUROLOGIC: Cranial nerves II through XII are intact. Muscle strength 5/5 in all extremities. Sensation intact. Gait not checked.  PSYCHIATRIC: The patient is alert and oriented x 3.  SKIN: No obvious rash, lesion, or ulcer.   ORDERS/RESULTS REVIEWED:   CBC  Recent Labs Lab 11/29/16 0106  WBC 8.8  HGB 11.4*  HCT 33.4*  PLT 308  MCV 91.4  MCH 31.3  MCHC 34.2  RDW 14.6*   ------------------------------------------------------------------------------------------------------------------  Chemistries   Recent Labs Lab 11/29/16 0106 11/30/16 1016  NA 136 138  K 4.4 3.7  CL 108 109  CO2 20* 22  GLUCOSE 117* 192*  BUN 31* 26*  CREATININE 1.95* 1.84*  CALCIUM 8.3* 8.3*  MG 1.4*  --    ------------------------------------------------------------------------------------------------------------------ estimated creatinine clearance is 31 mL/min (by C-G formula based on SCr of 1.84  mg/dL  (H)). ------------------------------------------------------------------------------------------------------------------ No results for input(s): TSH, T4TOTAL, T3FREE, THYROIDAB in the last 72 hours.  Invalid input(s): FREET3  Cardiac Enzymes  Recent Labs Lab 11/29/16 0109  TROPONINI 0.03*   ------------------------------------------------------------------------------------------------------------------ Invalid input(s): POCBNP ---------------------------------------------------------------------------------------------------------------  RADIOLOGY: Dg Chest 2 View  Result Date: 11/29/2016 CLINICAL DATA:  Shortness of breath. EXAM: CHEST  2 VIEW COMPARISON:  Radiographs 11/19/2016 FINDINGS: New left lower lobe airspace disease posteriorly suspicious for pneumonia. Possible small left pleural effusion. The right lung is clear. Calcified granulomas in the left lung. Unchanged heart size and mediastinal contours. Hilar prominence secondary to vascular structures as seen on chest CT January 2017. No pneumothorax. No acute osseous abnormality is seen. There are old left-sided rib fractures. IMPRESSION: New left lower lobe airspace disease suspicious for pneumonia with small effusion. Electronically Signed   By: Jeb Levering M.D.   On: 11/29/2016 01:42    EKG:  Orders placed or performed during the hospital encounter of 11/29/16  . EKG 12-Lead  . EKG 12-Lead  . ED EKG  . ED EKG    ASSESSMENT AND PLAN:  Active Problems:   HCAP (healthcare-associated pneumonia)   #1. Acute on chronic respiratory failure with hypoxia , likely due to CHF /fluid overload  , now off IV flids ,i on itiate Lasix intravenously, patient has clinically improved.  Procalcitonin level was low, now off antibiotics , follow clinically  #2  Chronic renal failure , CK D stage III, has imprved with IV fluidadinistrtion during prior admission, following with diuresis, remains stable  #3 elevated troponin, likely  demand ischemia, recheck troponin today, get cardiologist if needed. No chest pains were reported #4 . Right hand swelling, clinically concerning for gout exacerbation, uric acid level was normal, some improvement with prednisone orally, follow clinically, appreciate Dr's. [facility] input  Management plans discussed with the patient, family and they are in agreement.   DRUG ALLERGIES:  Allergies  Allergen Reactions  . Demerol [Meperidine] Nausea And Vomiting    Nausea/vomiting    CODE STATUS:     Code Status Orders        Start     Ordered   11/29/16 0516  Do not attempt resuscitation (DNR)  Continuous    Question Answer Comment  In the event of cardiac or respiratory ARREST Do not call a "code blue"   In the event of cardiac or respiratory ARREST Do not perform Intubation, CPR, defibrillation or ACLS   In the event of cardiac or respiratory ARREST Use medication by any route, position, wound care, and other measures to relive pain and suffering. May use oxygen, suction and manual treatment of airway obstruction as needed for comfort.      11/29/16 0516    Code Status History    Date Active Date Inactive Code Status Order ID Comments User Context   11/19/2016  7:00 PM 11/23/2016  8:20 PM DNR [id number]  Gladstone Lighter, MD Inpatient   02/12/2016  8:30 PM 02/14/2016  6:23 PM DNR [id number]  Hillary Bow, MD ED    Advance Directive Documentation   Flowsheet Row Most Recent Value  Type of Advance Directive  Healthcare Power of Attorney  Pre-existing out of facility DNR order (yellow form or pink MOST form)  No data  "MOST" Form in Place?  No data      TOTAL TIME TAKING CARE OF THIS PATIENT: 35 minutes.    Theodoro Grist M.D on 11/30/2016 at 3:34 PM  Between 7am to 6pm - Pager - [phone]  After 6pm go to [email or web address] - password [email or web address] [facility]  Office  [phone]  CC: Primary care physician; Kirk Ruths., MD

## 2016-11-30 NOTE — Evaluation (Signed)
Physical Therapy Evaluation Patient Details Name: Dean Garcia MRN: [id number] DOB: 12/25/1927 Today's Date: 11/30/2016   History of Present Illness  Pt admitted for HCAP. Pt with history of Afib, DM, CVA, prostate cancer s/p radiation and gout. Pt with complaints of SOB. Pt with multiple recent admissions for similar symptoms and recent admission last week for ARF.  Clinical Impression  Pt is a pleasant 82 year old male who was admitted for HCAP. Pt performs bed mobility, transfers, and ambulation with min assist and RW. Pt demonstrates deficits with strength/endurance/mobility. Pt currently not at baseline level at this time and is high risk for falls and continued hospitalizations. Would benefit from skilled PT to address above deficits and promote optimal return to PLOF; recommend transition to STR upon discharge from acute hospitalization.       Follow Up Recommendations SNF    Equipment Recommendations       Recommendations for Other Services       Precautions / Restrictions Precautions Precautions: Fall Restrictions Weight Bearing Restrictions: No      Mobility  Bed Mobility Overal bed mobility: Needs Assistance Bed Mobility: Supine to Sit     Supine to sit: Min assist     General bed mobility comments: able to initiate transfer, however needs assist for scooting out towards EOB. Once seated at EOB, able to sit with supervision. Slow technique performed  Transfers Overall transfer level: Needs assistance Equipment used: Rolling walker (2 wheeled) Transfers: Sit to/from Stand Sit to Stand: Min assist         General transfer comment: Cues for pushing from seated surface and using RW. Once standing, post leaning noted, however pt able to self correct with cues.  Ambulation/Gait Ambulation/Gait assistance: Min assist Ambulation Distance (Feet): 2 Feet Assistive device: Rolling walker (2 wheeled) Gait Pattern/deviations: Step-to pattern     General Gait  Details: ambulated to recliner with slow step to gait pattern. Unsafe to ambulate further secondary to poor balance.  Stairs            Wheelchair Mobility    Modified Rankin (Stroke Patients Only)       Balance Overall balance assessment: History of Falls;Needs assistance Sitting-balance support: Feet supported Sitting balance-Leahy Scale: Fair     Standing balance support: Bilateral upper extremity supported Standing balance-Leahy Scale: Fair                               Pertinent Vitals/Pain Pain Assessment: Faces Faces Pain Scale: Hurts a little bit Pain Location: R hand-from arthritis, limited ability to hold RW Pain Descriptors / Indicators: Discomfort;Dull Pain Intervention(s): Limited activity within patient's tolerance    Home Living Family/patient expects to be discharged to:: Private residence Living Arrangements: Spouse/significant other Available Help at Discharge: Family;Available 24 hours/day Type of Home: Independent living facility Home Access: Stairs to enter Entrance Stairs-Rails: Right Entrance Stairs-Number of Steps: 1 Home Layout: One level Home Equipment: Walker - 2 wheels;Walker - 4 wheels;Grab bars - toilet (transport chair)      Prior Function Level of Independence: Independent with assistive device(s)         Comments: resides at [place], recently at [facility] for same complaints     Hand Dominance        Extremity/Trunk Assessment   Upper Extremity Assessment Upper Extremity Assessment: Generalized weakness (B UE grossly 3+/5)    Lower Extremity Assessment Lower Extremity Assessment: Generalized weakness (B LE grossly3+/5)  Communication   Communication: No difficulties  Cognition Arousal/Alertness: Awake/alert Behavior During Therapy: WFL for tasks assessed/performed Overall Cognitive Status: Within Functional Limits for tasks assessed                      General Comments       Exercises Other Exercises Other Exercises: Seated ther-ex performed x 5 reps secondary to fatigue. Exercises included ankle pumps, SLRs, hip abd/add. Pt needs min assist for performance   Assessment/Plan    PT Assessment Patient needs continued PT services  PT Problem List Decreased strength;Decreased activity tolerance;Decreased balance;Decreased mobility          PT Treatment Interventions DME instruction;Gait training;Therapeutic exercise;Balance training    PT Goals (Current goals can be found in the Care Plan section)  Acute Rehab PT Goals Patient Stated Goal: to sit in the chair for a short periord PT Goal Formulation: With patient Time For Goal Achievement: 12/14/16 Potential to Achieve Goals: Good    Frequency Min 2X/week   Barriers to discharge        Co-evaluation               End of Session Equipment Utilized During Treatment: Gait belt Activity Tolerance: Patient tolerated treatment well Patient left: in chair;with chair alarm set Nurse Communication: Mobility status         Time: [id number] PT Time Calculation (min) (ACUTE ONLY): 17 min   Charges:   PT Evaluation $PT Eval Moderate Complexity: 1 Procedure PT Treatments $Therapeutic Exercise: 8-22 mins   PT G Codes:        Jerrod Damiano 11-Dec-2016, 11:45 AM  Greggory Stallion, PT, DPT [phone]

## 2016-12-01 LAB — GLUCOSE, CAPILLARY
Glucose-Capillary: 140 mg/dL — ABNORMAL HIGH (ref 65–99)
Glucose-Capillary: 189 mg/dL — ABNORMAL HIGH (ref 65–99)
Glucose-Capillary: 241 mg/dL — ABNORMAL HIGH (ref 65–99)
Glucose-Capillary: 253 mg/dL — ABNORMAL HIGH (ref 65–99)

## 2016-12-01 LAB — PROCALCITONIN: PROCALCITONIN: 0.12 ng/mL

## 2016-12-01 LAB — CREATININE, SERUM
Creatinine, Ser: 1.82 mg/dL — ABNORMAL HIGH (ref 0.61–1.24)
GFR, EST AFRICAN AMERICAN: 37 mL/min — AB (ref >60)
GFR, EST NON AFRICAN AMERICAN: 32 mL/min — AB (ref >60)

## 2016-12-01 MED ORDER — PROPRANOLOL HCL 10 MG PO TABS
10.0000 mg | ORAL_TABLET | Freq: Every day | ORAL | Status: DC
  Administered 2016-12-01: 10 mg via ORAL
  Filled 2016-12-01: qty 1

## 2016-12-01 NOTE — Progress Notes (Addendum)
[place] at [place] NAME: Dean Garcia    MR#:  [id number]  DATE OF BIRTH:  08/08/1928  SUBJECTIVE:  CHIEF COMPLAINT:   Chief Complaint  Patient presents with  . Shortness of Breath    Th patient is a 82 year old male wit past medical history significant for history of atrial fibrillation, diabetes mellitus, who presents to the hospital with complaints of shortness of breath.  He apparentl was recently admitted to the hospital for acute on chronic renal failue, receied IV flids, came back home, initiated physical therapy, and started feelig short of breath. On arrival to emergency room. At [facility] time He was noted to have left lower lobe atelectass versus infiltrate. The patient denies any cough or phlegm production.   Procalcitonin level was low, antibiotics wereNoted. Today patient feels better, no shortness of breath. Felt well and slept well last night. No chest pain.   Review of Systems  Constitutional: Negative for chills, fever and weight loss.  HENT: Negative for congestion.   Eyes: Negative for blurred vision and double vision.  Respiratory: Positive for shortness of breath. Negative for cough, sputum production and wheezing.   Cardiovascular: Negative for chest pain, palpitations, orthopnea, leg swelling and PND.  Gastrointestinal: Negative for abdominal pain, blood in stool, constipation, diarrhea, nausea and vomiting.  Genitourinary: Negative for dysuria, frequency, hematuria and urgency.  Musculoskeletal: Negative for falls.  Neurological: Negative for dizziness, tremors, focal weakness and headaches.  Endo/Heme/Allergies: Does not bruise/bleed easily.  Psychiatric/Behavioral: Negative for depression. The patient does not have insomnia.     VITAL SIGNS: Blood pressure (!) 137/44, pulse (!) 48, temperature 98.2 F (36.8 C), temperature source Oral, resp. rate 18, height 6\' 1"  (1.854 m), weight 76.1 kg (167 lb 12.8 oz), SpO2 98  %.  PHYSICAL EXAMINATION:   GENERAL:  82 y.o.-year-old patient lying in the bed with no acute distress.  EYES: Pupils equal, round, reactive to light and accommodation. No scleral icterus. Extraocular muscles intact.  HEENT: Head atraumatic, normocephalic. Oropharynx and nasopharynx clear.  NECK:  Supple, no jugular venous distention. No thyroid enlargement, no tenderness.  LUNGS:   some Dimiished breath sounds bilaterally at bases, no wheezing, rales,rhonchi  , but bilateral basilar crepitations. Not use of accessory muscles of respiration.  CARDIOVASCULAR: S1, S2 normal. No murmurs, rubs, or gallops.  ABDOMEN: Soft, nontender, nondistended. Bowel sounds present. No organomegaly or mass.  EXTREMITIES:  1+ lower extremity and pedal edema,  No cyanosis, or clubbing.  Right metacarpophlangeal joint is inflamed , pink, tender to touch, increased warmth was noted, range of motion is decreased NEUROLOGIC: Cranial nerves II through XII are intact. Muscle strength 5/5 in all extremities. Sensation intact. Gait not checked.  PSYCHIATRIC: The patient is alert and oriented x 3.  SKIN: No obvious rash, lesion, or ulcer.   ORDERS/RESULTS REVIEWED:   CBC  Recent Labs Lab 11/29/16 0106  WBC 8.8  HGB 11.4*  HCT 33.4*  PLT 308  MCV 91.4  MCH 31.3  MCHC 34.2  RDW 14.6*   ------------------------------------------------------------------------------------------------------------------  Chemistries   Recent Labs Lab 11/29/16 0106 11/30/16 1016 12/01/16 0532  NA 136 138  --   K 4.4 3.7  --   CL 108 109  --   CO2 20* 22  --   GLUCOSE 117* 192*  --   BUN 31* 26*  --   CREATININE 1.95* 1.84* 1.82*  CALCIUM 8.3* 8.3*  --   MG 1.4*  --   --    ------------------------------------------------------------------------------------------------------------------  estimated creatinine clearance is 31.4 mL/min (by C-G formula based on SCr of 1.82 mg/dL  (H)). ------------------------------------------------------------------------------------------------------------------ No results for input(s): TSH, T4TOTAL, T3FREE, THYROIDAB in the last 72 hours.  Invalid input(s): FREET3  Cardiac Enzymes  Recent Labs Lab 11/29/16 0109 11/30/16 1548  TROPONINI 0.03* <0.03   ------------------------------------------------------------------------------------------------------------------ Invalid input(s): POCBNP ---------------------------------------------------------------------------------------------------------------  RADIOLOGY: No results found.  EKG:  Orders placed or performed during the hospital encounter of 11/29/16  . EKG 12-Lead  . EKG 12-Lead  . ED EKG  . ED EKG    ASSESSMENT AND PLAN:  Active Problems:   HCAP (healthcare-associated pneumonia)   Muscle weakness (generalized)   SOB (shortness of breath)   Palliative care by specialist   #1. Acute on chronic respiratory failure with hypoxia , likely due to CHF /fluid overload  ,  on IV Lasix, clinically improved. Start by mouth Lasix. intravenously, patient has clinically improved.   Procalcitonin level was low, now off antibiotics , follow clinically   #2  Chronic renal failure , CK D stage III, has imprved with IV fluidadinistrtion during prior admission, following with diuresis, remains stable   #3 elevated troponin, likely demand ischemia, recheck troponin today, get cardiologist if needed. No chest pains were reported #4 . Right hand swelling, clinically concerning for gout exacerbation, uric acid level was normal, some improvement with prednisone orally, seen by Dr. Jefm Bryant  from rheumatology. Follow up with him as an outpatient.Started on allopurinol. 5. physical therapy recommended skilled nursing facility placement.   #6 CODE STATUS is DO NOT RESUSCITATE 7 atrial fibrillation: Has bradycardia today so I discontinued the long-acting Cardizem, decreased the dose of  propranolol. Watch and telemetry, start short-acting Cardizem tomorrow. Continue blood thinners. Discussed this with patient's nurse. Management plans discussed with the patient, family and they are in agreement. restless leg syndrome: : Continue Requip #9. BPH continue Tamsulosin #10 diabetes mellitus type 2: Continue insulin with sliding scale coverage.    DRUG ALLERGIES:  Allergies  Allergen Reactions  . Demerol [Meperidine] Nausea And Vomiting    Nausea/vomiting    CODE STATUS:     Code Status Orders        Start     Ordered   11/29/16 0516  Do not attempt resuscitation (DNR)  Continuous    Question Answer Comment  In the event of cardiac or respiratory ARREST Do not call a "code blue"   In the event of cardiac or respiratory ARREST Do not perform Intubation, CPR, defibrillation or ACLS   In the event of cardiac or respiratory ARREST Use medication by any route, position, wound care, and other measures to relive pain and suffering. May use oxygen, suction and manual treatment of airway obstruction as needed for comfort.      11/29/16 0516    Code Status History    Date Active Date Inactive Code Status Order ID Comments User Context   11/19/2016  7:00 PM 11/23/2016  8:20 PM DNR [id number]  Gladstone Lighter, MD Inpatient   02/12/2016  8:30 PM 02/14/2016  6:23 PM DNR [id number]  Hillary Bow, MD ED    Advance Directive Documentation   Flowsheet Row Most Recent Value  Type of Advance Directive  Healthcare Power of Attorney  Pre-existing out of facility DNR order (yellow form or pink MOST form)  No data  "MOST" Form in Place?  No data      TOTAL TIME TAKING CARE OF THIS PATIENT: 35 minutes.    Epifanio Lesches M.D on 12/01/2016 at 8:08  AM  Between 7am to 6pm - Pager - [phone]  After 6pm go to [email or web address] - password [email or web address] [facility]  Office  [phone]  CC: Primary care physician; Kirk Ruths., MD

## 2016-12-01 NOTE — Clinical Social Work Note (Signed)
CSW will assess for STR due to PT recommendation for SNF. CSW following.  Santiago Bumpers, MSW, LCSW-A [phone]

## 2016-12-02 LAB — GLUCOSE, CAPILLARY
Glucose-Capillary: 123 mg/dL — ABNORMAL HIGH (ref 65–99)
Glucose-Capillary: 233 mg/dL — ABNORMAL HIGH (ref 65–99)
Glucose-Capillary: 251 mg/dL — ABNORMAL HIGH (ref 65–99)
Glucose-Capillary: 180 mg/dL — ABNORMAL HIGH (ref 65–99)

## 2016-12-02 MED ORDER — FUROSEMIDE 20 MG PO TABS
20.0000 mg | ORAL_TABLET | Freq: Two times a day (BID) | ORAL | Status: DC
Start: 2016-12-02 — End: 2016-12-04
  Administered 2016-12-02 – 2016-12-04 (×4): 20 mg via ORAL
  Filled 2016-12-02 (×4): qty 1

## 2016-12-02 NOTE — NC FL2 (Signed)
[place] LEVEL OF CARE SCREENING TOOL     IDENTIFICATION  Patient Name: Dean Garcia Birthdate: 28-May-1928 Sex: male Admission Date (Current Location): 11/29/2016  [place] and [place] Number:  Engineering geologist and Address:  [facility], [address] [place], [place] [address]      Provider Number: [id number]  Attending Physician Name and Address:  Epifanio Lesches, MD  Relative Name and Phone Number:       Current Level of Care: Hospital Recommended Level of Care: [place] Prior Approval Number:    Date Approved/Denied:   PASRR Number:    Discharge Plan: SNF    Current Diagnoses: Patient Active Problem List   Diagnosis Date Noted  . Muscle weakness (generalized)   . SOB (shortness of breath)   . Palliative care by specialist   . HCAP (healthcare-associated pneumonia) 11/29/2016  . Pressure injury of skin 11/20/2016  . Palliative care encounter   . Goals of care, counseling/discussion   . Acute renal failure superimposed on chronic kidney disease ([place])   . ARF (acute renal failure) ([place]) 11/19/2016  . Moderate major depression, single episode ([place]) 10/22/2016  . Mild dementia 10/22/2016  . Paroxysmal atrial fibrillation ([place]) 02/21/2016  . A-fib ([place]) 02/12/2016  . Centrilobular emphysema ([place]) 12/27/2015  . B12 deficiency 04/12/2015  . Atherosclerosis of aorta ([place]) 10/23/2014  . 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 Has a tremor 07/17/2014  . Type 2 diabetes mellitus ([place]) 07/17/2014  . Edema 07/17/2014  . Billowing mitral valve 07/12/2014  . BP (high blood pressure) 07/12/2014  . Carotid artery narrowing 07/12/2014  . Impaired renal function 06/07/2014  . Proctitis, radiation 06/07/2014  . Malignant neoplasm of prostate ([place]) 06/07/2014  . Peripheral nerve disease ([place]) 06/07/2014  . Arthritis, degenerative 06/07/2014  . Disorder of peripheral nervous system ([place]) 06/07/2014  . Gout 06/07/2014   . 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 DD (diverticular disease) 06/07/2014  . Disease of rectum 06/07/2014  . Diabetes mellitus ([place]) 06/07/2014  . Malignant neoplasm of urinary bladder ([facility]) 06/07/2014    [place] RESPIRATION BLADDER Height & Weight     Self, Time, Situation, Place  Normal Continent Weight: 167 lb 12.8 oz (76.1 kg) Height:  6\' 1"  (185.4 cm)  BEHAVIORAL SYMPTOMS/MOOD NEUROLOGICAL BOWEL NUTRITION STATUS      Continent    AMBULATORY STATUS COMMUNICATION OF NEEDS Skin   Extensive Assist Verbally Normal                       Personal Care Assistance Level of Assistance  Bathing, Feeding, Dressing Bathing Assistance: Limited assistance Feeding assistance: Independent Dressing Assistance: Limited assistance     Functional Limitations Info             SPECIAL CARE FACTORS FREQUENCY  PT (By licensed PT)     PT Frequency: Up to 5 X per day, 5 days per week              Contractures Contractures Info: Present    Additional Factors Info  Code Status, Allergies Code Status Info: DNR Allergies Info: Demerol Meperidine           Current Medications (12/02/2016):  This is the current hospital active medication list Current Facility-Administered Medications  Medication Dose Route Frequency Provider Last Rate Last Dose  . allopurinol (ZYLOPRIM) tablet 100 mg  100 mg Oral Daily Harrie Foreman, MD   100 mg at 12/02/16 1046  .  amLODipine (NORVASC) tablet 2.5 mg  2.5 mg Oral Daily Harrie Foreman, MD   2.5 mg at 12/02/16 1046  . apixaban (ELIQUIS) tablet 2.5 mg  2.5 mg Oral BID Harrie Foreman, MD   2.5 mg at 12/02/16 1046  . atorvastatin (LIPITOR) tablet 40 mg  40 mg Oral QHS Harrie Foreman, MD   40 mg at 12/01/16 2215  . cyanocobalamin ((VITAMIN B-12)) injection 1,000 mcg  1,000 mcg Intramuscular Q30 days Harrie Foreman, MD   1,000 mcg at 11/29/16 1100  . cyclobenzaprine (FLEXERIL) tablet 5 mg  5 mg Oral QHS  Harrie Foreman, MD   5 mg at 12/01/16 2214  . diazepam (VALIUM) tablet 5 mg  5 mg Oral Q8H PRN Theodoro Grist, MD   5 mg at 12/02/16 0022  . furosemide (LASIX) tablet 20 mg  20 mg Oral BID Epifanio Lesches, MD      . insulin aspart (novoLOG) injection 0-5 Units  0-5 Units Subcutaneous QHS Harrie Foreman, MD   3 Units at 12/01/16 2215  . insulin aspart (novoLOG) injection 0-9 Units  0-9 Units Subcutaneous TID WC Harrie Foreman, MD   3 Units at 12/02/16 1225  . loratadine (CLARITIN) tablet 10 mg  10 mg Oral Daily Harrie Foreman, MD   10 mg at 12/02/16 1046  . mirtazapine (REMERON) tablet 15 mg  15 mg Oral QHS Harrie Foreman, MD   15 mg at 12/01/16 2215  . oxyCODONE-acetaminophen (PERCOCET/ROXICET) 5-325 MG per tablet 1 tablet  1 tablet Oral Q6H PRN Harrie Foreman, MD   1 tablet at 11/30/16 0231  . pantoprazole (PROTONIX) EC tablet 40 mg  40 mg Oral Daily Harrie Foreman, MD   40 mg at 12/02/16 1046  . potassium chloride (K-DUR,KLOR-CON) CR tablet 10 mEq  10 mEq Oral Daily Harrie Foreman, MD   10 mEq at 12/02/16 1046  . predniSONE (DELTASONE) tablet 50 mg  50 mg Oral Q breakfast Theodoro Grist, MD   50 mg at 12/02/16 0913  . primidone (MYSOLINE) tablet 50 mg  50 mg Oral BID Harrie Foreman, MD   50 mg at 12/02/16 1046  . rOPINIRole (REQUIP) tablet 2 mg  2 mg Oral BID Harrie Foreman, MD   2 mg at 12/02/16 1046  . tamsulosin (FLOMAX) capsule 0.4 mg  0.4 mg Oral Daily Harrie Foreman, MD   0.4 mg at 12/02/16 1046     Discharge Medications: Please see discharge summary for a list of discharge medications.  Relevant Imaging Results:  Relevant Lab Results:   Additional Information SS# [id number]  Zettie Pho, LCSW

## 2016-12-02 NOTE — Progress Notes (Signed)
[place] [facility] [place] NAME: Dean Garcia    MR#:  [id number]  DATE OF BIRTH:  1928/09/16  SUBJECTIVE: Says that he feels better, denies any complaints.  CHIEF COMPLAINT:   Chief Complaint  Patient presents with  . Shortness of Breath    Th patient is a 82 year old male wit past medical history significant for history of atrial fibrillation, diabetes mellitus, who presents to the hospital with complaints of shortness of breath.  He apparentl was recently admitted to the hospital for acute on chronic renal failue, receied IV flids, came back home, initiated physical therapy, and started feelig short of breath. On arrival to emergency room. At [facility] time He was noted to have left lower lobe atelectass versus infiltrate. The patient denies any cough or phlegm production.   Procalcitonin level was low, antibiotics wereNoted. Today patient feels better, no shortness of breath.   Review of Systems  Constitutional: Negative for chills, fever and weight loss.  HENT: Negative for congestion.   Eyes: Negative for blurred vision and double vision.  Respiratory: Positive for shortness of breath. Negative for cough, sputum production and wheezing.   Cardiovascular: Negative for chest pain, palpitations, orthopnea, leg swelling and PND.  Gastrointestinal: Negative for abdominal pain, blood in stool, constipation, diarrhea, nausea and vomiting.  Genitourinary: Negative for dysuria, frequency, hematuria and urgency.  Musculoskeletal: Negative for falls.  Neurological: Negative for dizziness, tremors, focal weakness and headaches.  Endo/Heme/Allergies: Does not bruise/bleed easily.  Psychiatric/Behavioral: Negative for depression. The patient does not have insomnia.     VITAL SIGNS: Blood pressure (!) 157/44, pulse (!) 47, temperature 97.6 F (36.4 C), temperature source Oral, resp. rate 18, height 6\' 1"  (1.854 m), weight 76.1 kg (167 lb 12.8 oz), SpO2 99  %.  PHYSICAL EXAMINATION:   GENERAL:  82 y.o.-year-old patient lying in the bed with no acute distress.  EYES: Pupils equal, round, reactive to light and accommodation. No scleral icterus. Extraocular muscles intact.  HEENT: Head atraumatic, normocephalic. Oropharynx and nasopharynx clear.  NECK:  Supple, no jugular venous distention. No thyroid enlargement, no tenderness.  LUNGS:   some Dimiished breath sounds bilaterally at bases, no wheezing, rales,rhonchi  , but bilateral basilar crepitations. Not use of accessory muscles of respiration.  CARDIOVASCULAR: S1, S2 normal. No murmurs, rubs, or gallops.  ABDOMEN: Soft, nontender, nondistended. Bowel sounds present. No organomegaly or mass.  EXTREMITIES:  1+ lower extremity and pedal edema,  No cyanosis, or clubbing.  Right metacarpophlangeal joint is inflamed , pink, tender to touch, increased warmth was noted, range of motion is decreased NEUROLOGIC: Cranial nerves II through XII are intact. Muscle strength 5/5 in all extremities. Sensation intact. Gait not checked.  PSYCHIATRIC: The patient is alert and oriented x 3.  SKIN: No obvious rash, lesion, or ulcer.   ORDERS/RESULTS REVIEWED:   CBC  Recent Labs Lab 11/29/16 0106  WBC 8.8  HGB 11.4*  HCT 33.4*  PLT 308  MCV 91.4  MCH 31.3  MCHC 34.2  RDW 14.6*   ------------------------------------------------------------------------------------------------------------------  Chemistries   Recent Labs Lab 11/29/16 0106 11/30/16 1016 12/01/16 0532  NA 136 138  --   K 4.4 3.7  --   CL 108 109  --   CO2 20* 22  --   GLUCOSE 117* 192*  --   BUN 31* 26*  --   CREATININE 1.95* 1.84* 1.82*  CALCIUM 8.3* 8.3*  --   MG 1.4*  --   --    ------------------------------------------------------------------------------------------------------------------  estimated creatinine clearance is 31.4 mL/min (by C-G formula based on SCr of 1.82 mg/dL  (H)). ------------------------------------------------------------------------------------------------------------------ No results for input(s): TSH, T4TOTAL, T3FREE, THYROIDAB in the last 72 hours.  Invalid input(s): FREET3  Cardiac Enzymes  Recent Labs Lab 11/29/16 0109 11/30/16 1548  TROPONINI 0.03* <0.03   ------------------------------------------------------------------------------------------------------------------ Invalid input(s): POCBNP ---------------------------------------------------------------------------------------------------------------  RADIOLOGY: No results found.  EKG:  Orders placed or performed during the hospital encounter of 11/29/16  . EKG 12-Lead  . EKG 12-Lead  . ED EKG  . ED EKG    ASSESSMENT AND PLAN:  Active Problems:   HCAP (healthcare-associated pneumonia)   Muscle weakness (generalized)   SOB (shortness of breath)   Palliative care by specialist   #1. Acute on chronic respiratory failure with hypoxia , likely due to CHF /fluid overload  ,  on IV Lasix, clinically improved. Start by mouth Lasix. intravenously, patient has clinically improved.   Procalcitonin level was low, now off antibiotics , follow clinically   #2  Chronic renal failure , CK D stage III, has imprved with IV fluidadinistrtion during prior admission, following with diuresis, remains stable   #3 elevated troponin, likely demand ischemia, recheck troponin today, get cardiologist if needed. No chest pains were reported #4 . Right hand swelling, clinically concerning for gout exacerbation, uric acid level was normal, some improvement with prednisone orally, seen by Dr. Jefm Bryant  from rheumatology. Follow up with him as an outpatient.Started on allopurinol. 5. physical therapy recommended skilled nursing facility placement.   #6 CODE STATUS is DO NOT RESUSCITATE 7 atrial fibrillation: Has bradycardia today so I discontinued the long-acting Cardizem, Stop the propranolol as  well.  Management plans discussed with the patient,  restless leg syndrome: : Continue Requip #9. BPH continue Tamsulosin #10 diabetes mellitus type 2: Continue insulin with sliding scale coverage. [facility] [place] but physical therapy recommended skilled nursing.   DRUG ALLERGIES:  Allergies  Allergen Reactions  . Demerol [Meperidine] Nausea And Vomiting    Nausea/vomiting    CODE STATUS:     Code Status Orders        Start     Ordered   11/29/16 0516  Do not attempt resuscitation (DNR)  Continuous    Question Answer Comment  In the event of cardiac or respiratory ARREST Do not call a "code blue"   In the event of cardiac or respiratory ARREST Do not perform Intubation, CPR, defibrillation or ACLS   In the event of cardiac or respiratory ARREST Use medication by any route, position, wound care, and other measures to relive pain and suffering. May use oxygen, suction and manual treatment of airway obstruction as needed for comfort.      11/29/16 0516    Code Status History    Date Active Date Inactive Code Status Order ID Comments User Context   11/19/2016  7:00 PM 11/23/2016  8:20 PM DNR [id number]  Gladstone Lighter, MD Inpatient   02/12/2016  8:30 PM 02/14/2016  6:23 PM DNR [id number]  Hillary Bow, MD ED    Advance Directive Documentation   Flowsheet Row Most Recent Value  Type of Advance Directive  Healthcare Power of Attorney  Pre-existing out of facility DNR order (yellow form or pink MOST form)  No data  "MOST" Form in Place?  No data      TOTAL TIME TAKING CARE OF THIS PATIENT: 35 minutes.    Epifanio Lesches M.D on 12/02/2016 at 11:44 AM  Between 7am  to 6pm - Pager - [phone]  After 6pm go to [email or web address] - password [email or web address] [facility]  Office  [phone]  CC: Primary care physician; Kirk Ruths., MD

## 2016-12-02 NOTE — Clinical Social Work Note (Signed)
Clinical Social Work Assessment  Patient Details  Name: Dean Garcia MRN: [id number] Date of Birth: 06/19/1928  Date of referral:  12/02/16               Reason for consult:  Facility Placement                Permission sought to share information with:  Chartered certified accountant granted to share information::  Yes, Verbal Permission Granted  Name::        Agency::     Relationship::     Contact Information:     Housing/Transportation Living arrangements for the past 2 months:  [place]) Source of Information:  Spouse Patient Interpreter Needed:  None Criminal Activity/Legal Involvement Pertinent to Current Situation/Hospitalization:  No - Comment as needed Significant Relationships:  Adult Children, Spouse Lives with:  Spouse Do you feel safe going back to the place where you live?  Yes Need for family participation in patient care:  No (Coment)  Care giving concerns:  STR   Social Worker assessment / plan:  CSW spoke with patient's wife at his request. The patient and his wife are residents of [place], and are in agreement with SNF for STR. They would prefer [facility] for SNF. The patient's wife indicated that the patient is baseline independent with ADLs and IADLs and is continent.  The patient's PASSR is [id number] which indicates a terminal/time frame restriction. The CSW entered a change in status which requires manual review.  Employment status:  Retired Forensic scientist:  Medicare PT Recommendations:  [place] / Referral to community resources:  [place]  Patient/Family's Response to care:  Patient and wife thanked CSW for assistance,  Patient/Family's Understanding of and Emotional Response to Diagnosis, Current Treatment, and Prognosis:  Patient and wife in agreement with plan.  Emotional Assessment Appearance:  Appears  stated age Attitude/Demeanor/Rapport:   (Pleasant) Affect (typically observed):  Appropriate, Pleasant Orientation:  Oriented to Self, Oriented to Place, Oriented to  Time, Oriented to Situation Alcohol / Substance use:  Never Used Psych involvement (Current and /or in the community):  No (Comment)  Discharge Needs  Concerns to be addressed:  Care Coordination Readmission within the last 30 days:  No Current discharge risk:  None Barriers to Discharge:  Continued Medical Work up   [employer], LCSW 12/02/2016, 4:54 PM

## 2016-12-03 LAB — BASIC METABOLIC PANEL
Anion gap: 6 (ref 5–15)
BUN: 35 mg/dL — AB (ref 6–20)
CHLORIDE: 104 mmol/L (ref 101–111)
CO2: 26 mmol/L (ref 22–32)
CREATININE: 1.7 mg/dL — AB (ref 0.61–1.24)
Calcium: 7.9 mg/dL — ABNORMAL LOW (ref 8.9–10.3)
GFR calc Af Amer: 40 mL/min — ABNORMAL LOW (ref >60)
GFR calc non Af Amer: 35 mL/min — ABNORMAL LOW (ref >60)
GLUCOSE: 136 mg/dL — AB (ref 65–99)
POTASSIUM: 4 mmol/L (ref 3.5–5.1)
SODIUM: 136 mmol/L (ref 135–145)

## 2016-12-03 LAB — GLUCOSE, CAPILLARY
Glucose-Capillary: 126 mg/dL — ABNORMAL HIGH (ref 65–99)
Glucose-Capillary: 194 mg/dL — ABNORMAL HIGH (ref 65–99)
Glucose-Capillary: 272 mg/dL — ABNORMAL HIGH (ref 65–99)
Glucose-Capillary: 281 mg/dL — ABNORMAL HIGH (ref 65–99)

## 2016-12-03 LAB — PROCALCITONIN: Procalcitonin: 0.13 ng/mL

## 2016-12-03 MED ORDER — ALUM & MAG HYDROXIDE-SIMETH 200-200-20 MG/5ML PO SUSP
30.0000 mL | Freq: Once | ORAL | Status: AC
  Administered 2016-12-03: 13:00:00 30 mL via ORAL
  Filled 2016-12-03: qty 30

## 2016-12-03 NOTE — Clinical Social Work Note (Addendum)
Patient is a manual [facility] review, [facility] requesting clinical documents to be faxed, CSW faxed requested information to [employer] awaiting [facility] number for patient.  CSW spoke to [facility] who can accept patient on Tuesday once insurance has been approved and [facility] number has been received.  Jones Broom. Brittanny Levenhagen, MSW, [place]  Mon-Fri 8a-4:30p 12/03/2016 10:19 AM

## 2016-12-03 NOTE — Progress Notes (Signed)
[place] at [place] NAME: Dean Garcia    MR#:  [id number]  DATE OF BIRTH:  [birth year]  SUBJECTIVE: Patient denies any complaints, having breakfast. Still bradycardic with heart rate in 40s.  CHIEF COMPLAINT:   Chief Complaint  Patient presents with  . Shortness of Breath    Th patient is a 83 year old male wit past medical history significant for history of atrial fibrillation, diabetes mellitus, who presents to the hospital with complaints of shortness of breath.  He apparentl was recently admitted to the hospital for acute on chronic renal failue, receied IV flids, came back home, initiated physical therapy, and started feelig short of breath. On arrival to emergency room. At [facility] time He was noted to have left lower lobe atelectass versus infiltrate. The patient denies any cough or phlegm production.   Procalcitonin level was low, antibiotics were stopped.. Renal  function stable, continues to have bradycardia heart we stopped the beta blockers, monitor on telemetry.    Review of Systems  Constitutional: Negative for chills, fever and weight loss.  HENT: Negative for congestion.   Eyes: Negative for blurred vision and double vision.  Respiratory: Positive for shortness of breath. Negative for cough, sputum production and wheezing.   Cardiovascular: Negative for chest pain, palpitations, orthopnea, leg swelling and PND.  Gastrointestinal: Negative for abdominal pain, blood in stool, constipation, diarrhea, nausea and vomiting.  Genitourinary: Negative for dysuria, frequency, hematuria and urgency.  Musculoskeletal: Negative for falls.  Neurological: Negative for dizziness, tremors, focal weakness and headaches.  Endo/Heme/Allergies: Does not bruise/bleed easily.  Psychiatric/Behavioral: Negative for depression. The patient does not have insomnia.     VITAL SIGNS: Blood pressure (!) 135/47, pulse (!) 45, temperature 97.6 F (36.4 C),  temperature source Oral, resp. rate 18, height 6\' 1"  (1.854 m), weight 76.1 kg (167 lb 12.8 oz), SpO2 95 %.  PHYSICAL EXAMINATION:   GENERAL:  83 y.o.-year-old patient lying in the bed with no acute distress.  EYES: Pupils equal, round, reactive to light and accommodation. No scleral icterus. Extraocular muscles intact.  HEENT: Head atraumatic, normocephalic. Oropharynx and nasopharynx clear.  NECK:  Supple, no jugular venous distention. No thyroid enlargement, no tenderness.  LUNGS:   some Dimiished breath sounds bilaterally at bases, no wheezing, rales,rhonchi  , but bilateral basilar crepitations. Not use of accessory muscles of respiration.  CARDIOVASCULAR: S1, S2 normal. No murmurs, rubs, or gallops.  ABDOMEN: Soft, nontender, nondistended. Bowel sounds present. No organomegaly or mass.  EXTREMITIES:  1+ lower extremity and pedal edema,  No cyanosis, or clubbing.  Right metacarpophlangeal joint is inflamed , pink, tender to touch, increased warmth was noted, range of motion is decreased NEUROLOGIC: Cranial nerves II through XII are intact. Muscle strength 5/5 in all extremities. Sensation intact. Gait not checked.  PSYCHIATRIC: The patient is alert and oriented x 3.  SKIN: No obvious rash, lesion, or ulcer.   ORDERS/RESULTS REVIEWED:   CBC  Recent Labs Lab 11/29/16 0106  WBC 8.8  HGB 11.4*  HCT 33.4*  PLT 308  MCV 91.4  MCH 31.3  MCHC 34.2  RDW 14.6*   ------------------------------------------------------------------------------------------------------------------  Chemistries   Recent Labs Lab 11/29/16 0106 11/30/16 1016 12/01/16 0532 12/03/16 0510  NA 136 138  --  136  K 4.4 3.7  --  4.0  CL 108 109  --  104  CO2 20* 22  --  26  GLUCOSE 117* 192*  --  136*  BUN 31* 26*  --  35*  CREATININE 1.95* 1.84* 1.82* 1.70*  CALCIUM 8.3* 8.3*  --  7.9*  MG 1.4*  --   --   --     ------------------------------------------------------------------------------------------------------------------ estimated creatinine clearance is 33.6 mL/min (by C-G formula based on SCr of 1.7 mg/dL (H)). ------------------------------------------------------------------------------------------------------------------ No results for input(s): TSH, T4TOTAL, T3FREE, THYROIDAB in the last 72 hours.  Invalid input(s): FREET3  Cardiac Enzymes  Recent Labs Lab 11/29/16 0109 11/30/16 1548  TROPONINI 0.03* <0.03   ------------------------------------------------------------------------------------------------------------------ Invalid input(s): POCBNP ---------------------------------------------------------------------------------------------------------------  RADIOLOGY: No results found.  EKG:  Orders placed or performed during the hospital encounter of 11/29/16  . EKG 12-Lead  . EKG 12-Lead  . ED EKG  . ED EKG    ASSESSMENT AND PLAN:  Active Problems:   HCAP (healthcare-associated pneumonia)   Muscle weakness (generalized)   SOB (shortness of breath)   Palliative care by specialist   #1. Acute on chronic respiratory failure with hypoxia , likely due to CHF /fluid overload  ,  Continue by mouth Lasix. Patient clinically is doing much better.   Procalcitonin level was low, now off antibiotics , follow clinically   #2  Chronic renal failure , CK D stage III, #3 /elevated troponin, likely demand ischemia, .Due to bradycardia cardiology consult requested, we held beta blockers. Patient follows up with Dr. Ubaldo Glassing.  #4 . Right hand swelling, clinically concerning for gout exacerbation, uric acid level was normal, some improvement with prednisone orally, seen by Dr. Jefm Bryant  from rheumatology. Follow up with him as an outpatient.Started on allopurinol.   5. physical therapy recommended skilled nursing facility placement.     #6 CODE STATUS is DO NOT RESUSCITATE   7  atrial fibrillation: Has bradycardia , so I discontinued the long-acting Cardizem, Stopped  the propranolol as well.    Management plans discussed with the patient, and his wife.   restless leg syndrome: : Continue Requip   #9. BPH continue Tamsulosin   #10 diabetes mellitus type 2: Continue insulin with sliding scale coverage.   [facility] [facility] but physical therapy recommended skilled nursing.   DRUG ALLERGIES:  Allergies  Allergen Reactions  . Demerol [Meperidine] Nausea And Vomiting    Nausea/vomiting    CODE STATUS:     Code Status Orders        Start     Ordered   11/29/16 0516  Do not attempt resuscitation (DNR)  Continuous    Question Answer Comment  In the event of cardiac or respiratory ARREST Do not call a "code blue"   In the event of cardiac or respiratory ARREST Do not perform Intubation, CPR, defibrillation or ACLS   In the event of cardiac or respiratory ARREST Use medication by any route, position, wound care, and other measures to relive pain and suffering. May use oxygen, suction and manual treatment of airway obstruction as needed for comfort.      11/29/16 0516    Code Status History    Date Active Date Inactive Code Status Order ID Comments User Context   11/19/2016  7:00 PM 11/23/2016  8:20 PM DNR [id number]  Gladstone Lighter, MD Inpatient   02/12/2016  8:30 PM 02/14/2016  6:23 PM DNR [id number]  Hillary Bow, MD ED    Advance Directive Documentation   Flowsheet Row Most Recent Value  Type of Advance Directive  Healthcare Power of Attorney  Pre-existing out of facility DNR order (yellow form or pink MOST form)  No data  "  MOST" Form in Place?  No data      TOTAL TIME TAKING CARE OF THIS PATIENT: 35 minutes.    Epifanio Lesches M.D on 12/03/2016 at 9:20 AM  Between 7am to 6pm - Pager - [phone]  After 6pm go to [email or web address] - password [email or web address] [place] [facility]  Office   [phone]  CC: Primary care physician; Kirk Ruths., MD

## 2016-12-03 NOTE — Consult Note (Signed)
[place]  CARDIOLOGY CONSULT NOTE  Patient ID: Dean Garcia MRN: [id number] DOB/AGE: 81/81/1929 81 y.o.  Admit date: 11/29/2016 Referring Physician   Primary Physician   Primary Cardiologist Dr. Ubaldo Glassing Reason for Consultation bradycardia  HPI: Asked to see patient regarding slow heart rate. Patient was admitted December 28 with complaints of shortness of breath. Patient has a history of atrial fibrillation, diabetes, renal insufficiency and COPD. He was slightly discharged from the hospital with chronic renal insufficiency with an acute exacerbation. He developed severe shortness of breath while at home. He presented to the emergency room where chest x-ray revealed new left lower lobe infiltrate. He was started on antibiotics and admitted. EKG showed junctional rhythm with rates in the 50s and 60s. Review of his electrocardiogram as an outpatient shows similar findings. Patient has a history of an ejection fraction of 55% by recent outpatient echo in November of this year. He has moderate MR mild AI. EKG done as an outpatient revealed similar heart rate with a junctional rhythm and rates in the 50s to 60s. Patient is asymptomatic from this. He is chronically anticoagulated with apixaban at renal dose at 2.5 mg twice daily. He is not on any AV nodal drugs. He denies syncope or presyncope.  Review of Systems  Constitutional: Positive for malaise/fatigue.  HENT: Negative.   Eyes: Negative.   Respiratory: Positive for shortness of breath.   Cardiovascular: Positive for leg swelling.  Gastrointestinal: Negative.   Genitourinary: Negative.   Musculoskeletal: Negative.   Skin: Negative.   Neurological: Positive for weakness.  Endo/Heme/Allergies: Negative.   Psychiatric/Behavioral: Negative.     Past Medical History:  Diagnosis Date  . Atrial fibrillation ([place])   . Bladder cancer ([place])   . Bundle branch block, right   . Carotid stenosis   . Colon  polyp   . CVA (cerebral infarction)   . Diabetes ([place])   . Gout   . High cholesterol   . Hypertension   . Mitral valve prolapse   . Prostate cancer ([place])   . Tremor     Family History  Problem Relation Age of Onset  . Hypertension Father   . Stroke Father   . Diabetes Mother     Social History   Social History  . Marital status: Married    Spouse name: N/A  . Number of children: N/A  . Years of education: N/A   Occupational History  . Not on file.   Social History Main Topics  . Smoking status: Former Smoker    Types: Pipe    Quit date: 10/29/2010  . Smokeless tobacco: Never Used  . Alcohol use Yes  . Drug use: No  . Sexual activity: Not on file   Other Topics Concern  . Not on file   Social History Narrative  . No narrative on file    Past Surgical History:  Procedure Laterality Date  . APPENDECTOMY       Prescriptions Prior to Admission  Medication Sig Dispense Refill Last Dose  . allopurinol (ZYLOPRIM) 100 MG tablet Take 100 mg by mouth daily.   11/28/2016 at Unknown time  . amLODipine (NORVASC) 5 MG tablet Take 2.5 mg by mouth daily.    11/28/2016 at Unknown time  . apixaban (ELIQUIS) 2.5 MG TABS tablet Take 2.5 mg by mouth 2 (two) times daily.    11/28/2016 at Unknown time  . atorvastatin (LIPITOR) 40 MG tablet Take 40 mg by mouth at bedtime.  11/28/2016 at Unknown time  . cyanocobalamin (,VITAMIN B-12,) 1000 MCG/ML injection Inject 1,000 mcg into the muscle every 30 (thirty) days.    11/28/2016 at Unknown time  . diltiazem (CARDIZEM CD) 240 MG 24 hr capsule Take 1 capsule (240 mg total) by mouth daily. 30 capsule 0 11/28/2016 at Unknown time  . fexofenadine (ALLEGRA) 180 MG tablet Take 180 mg by mouth as needed for allergies or rhinitis.   11/28/2016 at Unknown time  . glucose blood (ACCU-CHEK COMPACT PLUS) test strip    11/28/2016 at Unknown time  . insulin lispro protamine-lispro (HUMALOG 75/25 MIX) (75-25) 100 UNIT/ML SUSP injection Inject 10 Units  into the skin daily before supper.   11/28/2016 at Unknown time  . mirtazapine (REMERON) 15 MG tablet Take 1 tablet (15 mg total) by mouth at bedtime. 30 tablet 0 11/28/2016 at Unknown time  . omeprazole (PRILOSEC) 20 MG capsule Take 20 mg by mouth daily.    11/28/2016 at Unknown time  . ondansetron (ZOFRAN-ODT) 4 MG disintegrating tablet Take 4 mg by mouth every 8 (eight) hours as needed for nausea or vomiting.    11/28/2016 at Unknown time  . potassium chloride (K-DUR) 10 MEQ tablet Take 10 mEq by mouth daily.   11/28/2016 at Unknown time  . primidone (MYSOLINE) 50 MG tablet Take 50 mg by mouth 2 (two) times daily.    11/28/2016 at Unknown time  . propranolol (INDERAL) 10 MG tablet Take 10 mg by mouth 2 (two) times daily.    11/28/2016 at Unknown time  . ranitidine (ZANTAC) 150 MG tablet Take 300 mg by mouth every evening.    11/28/2016 at Unknown time  . sitaGLIPtin (JANUVIA) 25 MG tablet Take 25 mg by mouth daily.    11/28/2016 at Unknown time  . tamsulosin (FLOMAX) 0.4 MG CAPS capsule Take 1 capsule (0.4 mg total) by mouth daily. 30 capsule 0 11/28/2016 at Unknown time    Physical Exam: Blood pressure (!) 135/47, pulse (!) 45, temperature 97.6 F (36.4 C), temperature source Oral, resp. rate 18, height 6\' 1"  (1.854 m), weight 167 lb 12.8 oz (76.1 kg), SpO2 95 %.   Wt Readings from Last 1 Encounters:  11/29/16 167 lb 12.8 oz (76.1 kg)     General appearance: alert and cooperative Resp: dullness to percussion LLL Chest wall: no tenderness Cardio: Bradycardic Heart rate in the 50s GI: soft, non-tender; bowel sounds normal; no masses,  no organomegaly Extremities: edema 1-2+ edema in the lower extremities bilateral Neurologic: Grossly normal  Labs:   Lab Results  Component Value Date   WBC 8.8 11/29/2016   HGB 11.4 (L) 11/29/2016   HCT 33.4 (L) 11/29/2016   MCV 91.4 11/29/2016   PLT 308 11/29/2016    Recent Labs Lab 12/03/16 0510  NA 136  K 4.0  CL 104  CO2 26  BUN 35*   CREATININE 1.70*  CALCIUM 7.9*  GLUCOSE 136*   Lab Results  Component Value Date   CKTOTAL 149 02/27/2013   CKMB 1.0 05/25/2013   TROPONINI <0.03 11/30/2016      Radiology:  Chest x-ray revealed new left lower lobe airspace disease suspicious for pneumonia with a small effusion EKG: Possible junctional rhythm in the 50s with right bundle branch block unchanged from baseline.   ASSESSMENT AND PLAN:  Patient is an 83 year old male with history of eighth tab and junctional rhythm with asymptomatic bradycardia as an outpatient on no AV nodal drugs. He is anticoagulated with apixaban at renal doses  at 2.5 mg twice daily. He was admitted with progressive shortness breath and noted to have probable left lower lobe pneumonia. He was treated with antibiotics. There was no evidence of congestive heart failure. His heart rate has remained stable and he is asymptomatic and hemodynamically stable from this. Would continue with apixaban at current dose avoiding rate related medications and continue treatment of his pneumonia. We'll continue to follow as an outpatient after discharge. He does not appear to require any further invasive or noninvasive cardiac evaluation at this time. Signed: Teodoro Spray MD, [facility] 12/03/2016, 10:27 AM

## 2016-12-04 LAB — GLUCOSE, CAPILLARY
Glucose-Capillary: 115 mg/dL — ABNORMAL HIGH (ref 65–99)
Glucose-Capillary: 259 mg/dL — ABNORMAL HIGH (ref 65–99)

## 2016-12-04 LAB — CBC
HEMATOCRIT: 30 % — AB (ref 40.0–52.0)
Hemoglobin: 10.4 g/dL — ABNORMAL LOW (ref 13.0–18.0)
MCH: 31.4 pg (ref 26.0–34.0)
MCHC: 34.5 g/dL (ref 32.0–36.0)
MCV: 91.1 fL (ref 80.0–100.0)
PLATELETS: 378 10*3/uL (ref 150–440)
RBC: 3.3 MIL/uL — AB (ref 4.40–5.90)
RDW: 14.5 % (ref 11.5–14.5)
WBC: 9.2 10*3/uL (ref 3.8–10.6)

## 2016-12-04 MED ORDER — IPRATROPIUM-ALBUTEROL 0.5-2.5 (3) MG/3ML IN SOLN
3.0000 mL | Freq: Once | RESPIRATORY_TRACT | Status: AC
  Administered 2016-12-04: 3 mL via RESPIRATORY_TRACT

## 2016-12-04 MED ORDER — IPRATROPIUM-ALBUTEROL 0.5-2.5 (3) MG/3ML IN SOLN
RESPIRATORY_TRACT | Status: AC
  Administered 2016-12-04: 3 mL via RESPIRATORY_TRACT
  Filled 2016-12-04: qty 3

## 2016-12-04 MED ORDER — PREDNISONE 20 MG PO TABS: 40.0000 mg | ORAL_TABLET | Freq: Every day | ORAL | 0 refills | Status: DC

## 2016-12-04 MED ORDER — FUROSEMIDE 20 MG PO TABS: 20.0000 mg | ORAL_TABLET | Freq: Two times a day (BID) | ORAL | 0 refills | Status: DC

## 2016-12-04 NOTE — Progress Notes (Signed)
EMS arrived to pickup patient to take to [facility]. Report given to EMS personnel and patient discharged via stretcher with belongings.

## 2016-12-04 NOTE — Clinical Social Work Note (Signed)
Patient to be d/c'ed today to [facility].  Patient and family agreeable to plans will transport via ems RN to call report to [phone] [id number] [id number].  Evette Cristal, MSW, LCSWA Mon-Fri 8a-4:30p [phone]

## 2016-12-04 NOTE — Clinical Social Work Note (Signed)
CSW received [place] number [id number], CSW to continue to follow patient's progress and facilitate discharge planning to [facility], who can accept patient once he is medically ready for discharge and orders have been received.  Jones Broom. Sumpter, MSW, [place]  Mon-Fri 8a-4:30p 12/04/2016 8:35 AM

## 2016-12-04 NOTE — Discharge Summary (Signed)
Dean Garcia, is a 83 y.o. male  DOB March 01, 1928  MRN [id number].  Admission date:  11/29/2016  Admitting Physician  Harrie Foreman, MD  Discharge Date:  12/04/2016   Primary MD  Kirk Ruths., MD  Recommendations for primary care physician for things to follow:  Up with primary doctor in one week Follow up with Dr. Ubaldo Glassing  in 2 weeks   Admission Diagnosis  Weakness [R53.1] SOB (shortness of breath) [R06.02] HCAP (healthcare-associated pneumonia) [J18.9]   Discharge Diagnosis  Weakness [R53.1] SOB (shortness of breath) [R06.02] HCAP (healthcare-associated pneumonia) [J18.9]    Active Problems:   HCAP (healthcare-associated pneumonia)   Muscle weakness (generalized)   SOB (shortness of breath)   Palliative care by specialist      Past Medical History:  Diagnosis Date  . Atrial fibrillation ([place])   . Bladder cancer ([place])   . Bundle branch block, right   . Carotid stenosis   . Colon polyp   . CVA (cerebral infarction)   . Diabetes ([place])   . Gout   . High cholesterol   . Hypertension   . Mitral valve prolapse   . Prostate cancer ([place])   . Tremor     Past Surgical History:  Procedure Laterality Date  . APPENDECTOMY         History of present illness and  Hospital Course:     Kindly see H&P for history of present illness and admission details, please review complete Labs, Consult reports and Test reports for all details in brief  HPI  from the history and physical done on the day of admission   patient with past medical history of atrial fibrillation and diabetes mellitus presents to the emergency department complaining of shortness of breath. He was recently released from the hospital after an episode of acute on chronic renal failure. He had been home and barely gotten set up with home health  care and physical therapy when he began feeling short of breath. He denies fevers, nausea or vomiting. His wife has supplemental oxygen at home for his copd.Chest x-ray showed a new left lower lobe infiltrate, however the patient was not hypoxic in the emergency department. After starting antibiotics he seemed to feel better on room air. However, due to his age, recent hospitalization and overall deconditioning the emergency department staff called the hospitalist service for admission   Hospital Course  1 shortness of breath multifactorial secondary to CHF, depression, pneumonia: Patient started on vancomycin, cefepime in the emergency room,. But because of low pro-calcitonin levels with postoperative antibiotics.r  #2 acute on chronic diastolic heart failure: Patient started on now Lasix, shortness of breath improved with Lasix. Seen by cardiology.  #3 bradycardia with junctional rhythm: Initially received Cardizem, propranolol but stopped them because of low heart rate with up to 40 bpm. Seen by cardiology Dr. Ubaldo Glassing. recommended no AV nodal blocking agents  because of bradycardia. Stopped rate controlling medications including Cardizem, metoprolol. Continue  apixiban  for atrial fibrillation, use of anticoagulation.  #4. right hand swelling concerning for gout flare: Seen by Dr. Rhina Brackett from rheumatology, patient uric acid  Level is normal. Started on allopurinol also. Patient can follow with him as an outpatient. Swelling of the index finger of the right hand improved. Received prednisone 60 mg daily while in the hospital, discharged to skilled nursing is tapering course of prednisone.   #4 diabetes mellitus type 2: Continue insulin with sliding scale coverage  #6 BPH continue  flomax.  Physical  therapy Center. Nursing: Patient is going to T #8 hypokalemia secondary to diuretics: Replace the potassium.   Discharge Condition: Stable   Follow UP   Contact information for follow-up  providers    Teodoro Spray, MD Follow up in 2 week(s).   Specialty:  Cardiology Contact information: [place] [address] [phone]        Kirk Ruths., MD Follow up in 1 week(s).   Specialty:  Internal Medicine Contact information: [place] [address] [phone]            Contact information for after-discharge care    Destination    [facility] Follow up.   Specialties:  [place], [place] Contact information: [place] [phone]                    Discharge Instructions  and  Discharge Medications     Allergies as of 12/04/2016      Reactions   Demerol [meperidine] Nausea And Vomiting   Nausea/vomiting      Medication List    STOP taking these medications   diltiazem 240 MG 24 hr capsule Commonly known as:  CARDIZEM CD   propranolol 10 MG tablet Commonly known as:  INDERAL     TAKE these medications   ACCU-CHEK COMPACT PLUS test strip Generic drug:  glucose blood   allopurinol 100 MG tablet Commonly known as:  ZYLOPRIM Take 100 mg by mouth daily.   amLODipine 5 MG tablet Commonly known as:  NORVASC Take 2.5 mg by mouth daily.   cyanocobalamin 1000 MCG/ML injection Commonly known as:  (VITAMIN B-12) Inject 1,000 mcg into the muscle every 30 (thirty) days.   ELIQUIS 2.5 MG Tabs tablet Generic drug:  apixaban Take 2.5 mg by mouth 2 (two) times daily.   fexofenadine 180 MG tablet Commonly known as:  ALLEGRA Take 180 mg by mouth as needed for allergies or rhinitis.   furosemide 20 MG tablet Commonly known as:  LASIX Take 1 tablet (20 mg total) by mouth 2 (two) times daily.   insulin lispro protamine-lispro (75-25) 100 UNIT/ML Susp injection Commonly known as:  HUMALOG 75/25 MIX Inject 10 Units into the skin daily before supper.   LIPITOR 40 MG tablet Generic drug:   atorvastatin Take 40 mg by mouth at bedtime.   mirtazapine 15 MG tablet Commonly known as:  REMERON Take 1 tablet (15 mg total) by mouth at bedtime.   omeprazole 20 MG capsule Commonly known as:  PRILOSEC Take 20 mg by mouth daily.   ondansetron 4 MG disintegrating tablet Commonly known as:  ZOFRAN-ODT Take 4 mg by mouth every 8 (eight) hours as needed for nausea or vomiting.   potassium chloride 10 MEQ tablet Commonly known as:  K-DUR Take 10 mEq by mouth daily.   predniSONE 20 MG tablet Commonly known as:  DELTASONE Take 2 tablets (40 mg total) by mouth daily with breakfast. 40 mg daily for 4 days, 10 mg daily for 4 days and then stop   primidone 50 MG tablet Commonly known as:  MYSOLINE Take 50 mg by mouth 2 (two) times daily.   ranitidine 150 MG tablet Commonly known as:  ZANTAC Take 300 mg by mouth every evening.   sitaGLIPtin 25 MG tablet Commonly known as:  JANUVIA Take 25 mg by mouth daily.   tamsulosin 0.4 MG Caps capsule Commonly  known as:  FLOMAX Take 1 capsule (0.4 mg total) by mouth daily.         Diet and Activity recommendation: See Discharge Instructions above   Consults obtained -Cardiology   Major procedures and Radiology Reports - PLEASE review detailed and final reports for all details, in brief -      Dg Chest 2 View  Result Date: 11/29/2016 CLINICAL DATA:  Shortness of breath. EXAM: CHEST  2 VIEW COMPARISON:  Radiographs 11/19/2016 FINDINGS: New left lower lobe airspace disease posteriorly suspicious for pneumonia. Possible small left pleural effusion. The right lung is clear. Calcified granulomas in the left lung. Unchanged heart size and mediastinal contours. Hilar prominence secondary to vascular structures as seen on chest CT January 2017. No pneumothorax. No acute osseous abnormality is seen. There are old left-sided rib fractures. IMPRESSION: New left lower lobe airspace disease suspicious for pneumonia with small effusion.  Electronically Signed   By: Jeb Levering M.D.   On: 11/29/2016 01:42   Dg Chest 2 View  Result Date: 11/19/2016 CLINICAL DATA:  Shortness of Breath EXAM: CHEST  2 VIEW COMPARISON:  October 22, 2016 FINDINGS: There is a calcified granuloma in the left upper lobe. There is no appreciable edema or consolidation. Heart size and pulmonary vascularity are normal. There is a sizable hiatal type hernia. No adenopathy. No bone lesions. IMPRESSION: Sizable hiatal hernia. Calcified granuloma left upper lobe. No edema or consolidation. Electronically Signed   By: Lowella Grip III M.D.   On: 11/19/2016 12:16   US Renal  Result Date: 11/19/2016 CLINICAL DATA:  Acute renal failure EXAM: RENAL / URINARY TRACT ULTRASOUND COMPLETE COMPARISON:  Abdominal and pelvic CT scan of March 02, 2015 FINDINGS: Right Kidney: Length: 10.6 cm. The renal cortical echotexture remains lower than that of the adjacent liver. There are multiple cortical cysts with the largest rising from the lower pole measuring 5.5 x 5.3 x 6.3 cm. There is no hydronephrosis. Left Kidney: Length: 10.7 cm. The renal cortical echotexture is similar to that on the right. There are multiple cortical cysts. The largest lies in the upper pole measuring 4.1 x 3.3 x 3.8 cm. There is no hydronephrosis. Bladder: The urinary bladder was not visualized due to the presence of Foley catheter. IMPRESSION: Multiple known cysts in the kidneys. No hydronephrosis nor definite stones. The renal cortical echotexture remains normal. The urinary bladder was decompressed by a Foley catheter. Electronically Signed   By: David  Martinique M.D.   On: 11/19/2016 16:14    Micro Results    No results found for this or any previous visit (from the past 240 hour(s)).     Today   Subjective:   Morgun Glenny today has  noShortness of breath, stable for discharge  Objective:   Blood pressure (!) 139/49, pulse (!) 41, temperature 98.1 F (36.7 C), temperature source Oral,  resp. rate 19, height 6\' 1"  (1.854 m), weight 76.1 kg (167 lb 12.8 oz), SpO2 98 %.   Intake/Output Summary (Last 24 hours) at 12/04/16 1131 Last data filed at 12/04/16 0949  Gross per 24 hour  Intake              480 ml  Output             1050 ml  Net             -570 ml    Exam Awake Alert, Oriented x 3, No new F.N deficits, Normal affect Eclectic.AT,PERRAL Supple Neck,No JVD, No  cervical lymphadenopathy appriciated.  Symmetrical Chest wall movement, Good air movement bilaterally, CTAB RRR,No Gallops,Rubs or new Murmurs, No Parasternal Heave +ve B.Sounds, Abd Soft, Non tender, No organomegaly appriciated, No rebound -guarding or rigidity. No Cyanosis, Clubbing or edema, No new Rash or bruise  Data Review   CBC w Diff: Lab Results  Component Value Date   WBC 9.2 12/04/2016   HGB 10.4 (L) 12/04/2016   HGB 13.4 03/02/2015   HCT 30.0 (L) 12/04/2016   HCT 40.8 03/02/2015   PLT 378 12/04/2016   PLT 220 03/02/2015   LYMPHOPCT 5 11/19/2016   LYMPHOPCT 8.0 03/02/2015   MONOPCT 8 11/19/2016   MONOPCT 12.2 03/02/2015   EOSPCT 1 11/19/2016   EOSPCT 1.1 03/02/2015   BASOPCT 0 11/19/2016   BASOPCT 0.5 03/02/2015    CMP: Lab Results  Component Value Date   NA 136 12/03/2016   NA 140 03/02/2015   K 4.0 12/03/2016   K 3.1 (L) 03/02/2015   CL 104 12/03/2016   CL 98 (L) 03/02/2015   CO2 26 12/03/2016   CO2 29 03/02/2015   BUN 35 (H) 12/03/2016   BUN 62 (H) 03/02/2015   CREATININE 1.70 (H) 12/03/2016   CREATININE 2.14 (H) 03/02/2015   PROT 6.6 11/19/2016   PROT 7.1 03/02/2015   ALBUMIN 2.2 (L) 11/22/2016   ALBUMIN 3.7 03/02/2015   BILITOT 0.1 (L) 11/19/2016   BILITOT 0.4 03/02/2015   ALKPHOS 61 11/19/2016   ALKPHOS 65 03/02/2015   AST 25 11/19/2016   AST 27 03/02/2015   ALT 11 (L) 11/19/2016   ALT 19 03/02/2015  .   Total Time in preparing paper work, data evaluation and todays exam - 63 minutes  Amin Fornwalt M.D on 12/04/2016 at 11:31 AM    Note: This  dictation was prepared with Dragon dictation along with smaller phrase technology. Any transcriptional errors that result from this process are unintentional.

## 2016-12-04 NOTE — Progress Notes (Signed)
Inpatient Diabetes Program Recommendations  AACE/ADA: New Consensus Statement on Inpatient Glycemic Control (2015)  Target Ranges:  Prepandial:   less than 140 mg/dL      Peak postprandial:   less than 180 mg/dL (1-2 hours)      Critically ill patients:  140 - 180 mg/dL   Results for Colon, Delaossa Ephraim (MRN [id number]) as of 12/04/2016 08:18  Ref. Range 12/03/2016 07:52 12/03/2016 12:01 12/03/2016 16:40 12/03/2016 21:25  Glucose-Capillary Latest Ref Range: 65 - 99 mg/dL 126 (H) 194 (H) 272 (H) 281 (H)    Admit with: Pneumonia  History: DM, CKD, CVA  Home DM Meds: Januvia 25 mg daily       Humalog 75/25 Insulin- 10 units daily with supper  Current Insulin Orders: Novolog Sensitive Correction Scale/ SSI (0-9 units) TID AC + HS       MD- Note patient receiving Prednisone 50 mg daily.  Having issues with elevated postprandial glucose levels likely due to Prednisone.  Please consider starting Novolog Meal Coverage: Novolog 4 units TID with meals (hold if pt eats <50% of meal)      --Will follow patient during hospitalization--  Wyn Quaker RN, MSN, CDE Diabetes Coordinator Inpatient Glycemic Control Team Team Pager: [phone] (8a-5p)

## 2016-12-04 NOTE — Progress Notes (Signed)
12/04/2016 2:37 PM  BP (!) 130/56 (BP Location: Right Arm)   Pulse (!) 53   Temp 98.5 F (36.9 C) (Oral)   Resp 18   Ht 6\' 1"  (1.854 m)   Wt 167 lb 12.8 oz (76.1 kg)   SpO2 94%   BMI 22.14 kg/m  Patient discharged per MD orders. Report called to [place], Therapist, sports at [facility] Discharge instructions reviewed with and patient verbalized understanding. IV removed per policy. Prescriptions sent to pharmacy. Patient belongings gathered and put in red and white personal bag with patient's clothing. Awaiting EMS to arrive for pickup to transfer patient to facility.  Almedia Balls, RN

## 2016-12-04 NOTE — Care Management Important Message (Signed)
Important Message  Patient Details  Name: Dean Garcia MRN: [id number] Date of Birth: 04-Apr-1928   Medicare Important Message Given:  Yes    Shelbie Ammons, RN 12/04/2016, 8:42 AM

## 2016-12-04 NOTE — Progress Notes (Signed)
Physical Therapy Treatment Patient Details Name: Dean Garcia MRN: [id number] DOB: 12-25-1927 Today's Date: 12/04/2016    History of Present Illness Pt admitted for HCAP. Pt with history of Afib, DM, CVA, prostate cancer s/p radiation and gout. Pt with complaints of SOB. Pt with multiple recent admissions for similar symptoms and recent admission last week for ARF.    PT Comments    Pt is making gradual progress towards goals with increased ambulation distance noted this date. Good participation noted this date with there-ex. Still requires RW for balance deficits and fatigues quickly. Will continue to progress.  Follow Up Recommendations  SNF     Equipment Recommendations       Recommendations for Other Services       Precautions / Restrictions Precautions Precautions: Fall Restrictions Weight Bearing Restrictions: No    Mobility  Bed Mobility Overal bed mobility: Needs Assistance Bed Mobility: Supine to Sit     Supine to sit: Min guard     General bed mobility comments: Safe technique with mobility, once seated at EOB, able to sit with supervision.  Transfers Overall transfer level: Needs assistance Equipment used: Rolling walker (2 wheeled) Transfers: Sit to/from Stand Sit to Stand: Min assist         General transfer comment: cues given for pushing from seated surface. Once standing, pt able to stand with upright posture. Used RW for assistance  Ambulation/Gait Ambulation/Gait assistance: Museum/gallery curator (Feet): 15 Feet Assistive device: Rolling walker (2 wheeled) Gait Pattern/deviations: Step-through pattern     General Gait Details: ambulated using improved reciprocal gait pattern. Short step length noted and heavy relaince on RW for balance. Pt deconditioned and fatiged with minimal distance.   Stairs            Wheelchair Mobility    Modified Rankin (Stroke Patients Only)       Balance                                    Cognition Arousal/Alertness: Awake/alert Behavior During Therapy: WFL for tasks assessed/performed Overall Cognitive Status: Within Functional Limits for tasks assessed                      Exercises Other Exercises Other Exercises: supine ther-ex performed x 12 reps including ankle pumps, SLRs, SAQ, hip add sqeezes, and hip abd/add. All ther-ex performed with cga and cues for correct technique.    General Comments        Pertinent Vitals/Pain Pain Assessment: No/denies pain    Home Living                      Prior Function            PT Goals (current goals can now be found in the care plan section) Acute Rehab PT Goals Patient Stated Goal: to try walking PT Goal Formulation: With patient Time For Goal Achievement: 12/14/16 Potential to Achieve Goals: Good Progress towards PT goals: Progressing toward goals    Frequency    Min 2X/week      PT Plan Current plan remains appropriate    Co-evaluation             End of Session Equipment Utilized During Treatment: Gait belt Activity Tolerance: Patient tolerated treatment well Patient left: in chair;with chair alarm set     Time: [id number] PT Time Calculation (min) (  ACUTE ONLY): 23 min  Charges:  $Gait Training: 8-22 mins $Therapeutic Exercise: 8-22 mins                    G Codes:      Balraj Brayfield 12-30-2016, 1:51 PM  Greggory Stallion, PT, DPT [phone]

## 2016-12-04 NOTE — Progress Notes (Signed)
Patient called Rn to room complaining of shortness of breath. Patient noted to be resting comfortably in bed, oxygen saturation 97% on room air and lung sounds clear. Dr. Vianne Bulls notified and duoneb ordered once. Duoneb administered and patient resting comfortably in bed. Oxygen saturation 100% on room air. Will continue to monitor.

## 2016-12-06 ENCOUNTER — Ambulatory Visit: Payer: Self-pay | Admitting: Urology

## 2016-12-06 DIAGNOSIS — I481 Persistent atrial fibrillation: Secondary | ICD-10-CM | POA: Diagnosis not present

## 2016-12-06 DIAGNOSIS — J181 Lobar pneumonia, unspecified organism: Secondary | ICD-10-CM

## 2016-12-06 DIAGNOSIS — F39 Unspecified mood [affective] disorder: Secondary | ICD-10-CM

## 2016-12-06 DIAGNOSIS — M1000 Idiopathic gout, unspecified site: Secondary | ICD-10-CM

## 2016-12-06 DIAGNOSIS — I5043 Acute on chronic combined systolic (congestive) and diastolic (congestive) heart failure: Secondary | ICD-10-CM

## 2016-12-06 DIAGNOSIS — R339 Retention of urine, unspecified: Secondary | ICD-10-CM

## 2016-12-06 DIAGNOSIS — E119 Type 2 diabetes mellitus without complications: Secondary | ICD-10-CM | POA: Diagnosis not present

## 2016-12-06 NOTE — Progress Notes (Deleted)
12/06/2016 8:14 AM   Dean Garcia December 03, 1928 [id number]  Referring provider: Kirk Ruths, MD [facility] [place], [place] [address]  No chief complaint on file.   HPI: Patient is an 83 year old Caucasian male who presents today for a voiding trial due to urinary retention.  Patient was admitted to the hospital approximately 1 month ago for weakness and was found to have acute on chronic renal failure. A Foley catheter was placed to carefully monitor I's and O's. Upon discontinuing the Foley catheter, the patient was unable to void on his own. He was initiated on tamsulosin at that time.  He was discharged with an indwelling Foley, but he return to the hospital the next day with pneumonia. He spent another 2 weeks in the hospital for the pneumonia and has been discharged again with the Foley catheter.  Patient has a history of prostate cancer treated with radiation.  Pathology and last PSA are unknown at this time.  Patient is DO NOT RESUSCITATE.  Renal ultrasound performed during his hospitiliaztion noted multiple known cysts in the kidneys. No hydronephrosis nor definite stones. The renal cortical echotexture remains normal.  The urinary bladder was decompressed by a Foley catheter.  I have independently reviewed the films.   Prior to hospitalization, he was voiding ***  BPH WITH LUTS His IPSS score today is ***, which is *** lower urinary tract symptomatology. He is *** with his quality life due to his urinary symptoms. His PVR is *** mL.  His previous IPSS score was ***.  His previous PVR is *** mL.    His major complaint today ***.  He has had these symptoms for *** years.  He denies any dysuria, hematuria or suprapubic pain.   He currently taking ***.  His has had ***.  Previous PSA's:     He also denies any recent fevers, chills, nausea or vomiting.  He has a family history of PCa, with ***.   He does not have a family history of  PCa.***    Score:  1-7 Mild 8-19 Moderate 20-35 Severe    PMH: Past Medical History:  Diagnosis Date  . Atrial fibrillation ([place])   . Bladder cancer ([place])   . Bundle branch block, right   . Carotid stenosis   . Colon polyp   . CVA (cerebral infarction)   . Diabetes ([place])   . Gout   . High cholesterol   . Hypertension   . Mitral valve prolapse   . Prostate cancer ([place])   . Tremor     Surgical History: Past Surgical History:  Procedure Laterality Date  . APPENDECTOMY      Home Medications:  Allergies as of 12/06/2016      Reactions   Demerol [meperidine] Nausea And Vomiting   Nausea/vomiting      Medication List       Accurate as of 12/06/16  8:14 AM. Always use your most recent med list.          ACCU-CHEK COMPACT PLUS test strip Generic drug:  glucose blood   allopurinol 100 MG tablet Commonly known as:  ZYLOPRIM Take 100 mg by mouth daily.   amLODipine 5 MG tablet Commonly known as:  NORVASC Take 2.5 mg by mouth daily.   cyanocobalamin 1000 MCG/ML injection Commonly known as:  (VITAMIN B-12) Inject 1,000 mcg into the muscle every 30 (thirty) days.   ELIQUIS 2.5 MG Tabs tablet Generic drug:  apixaban Take  2.5 mg by mouth 2 (two) times daily.   fexofenadine 180 MG tablet Commonly known as:  ALLEGRA Take 180 mg by mouth as needed for allergies or rhinitis.   furosemide 20 MG tablet Commonly known as:  LASIX Take 1 tablet (20 mg total) by mouth 2 (two) times daily.   insulin lispro protamine-lispro (75-25) 100 UNIT/ML Susp injection Commonly known as:  HUMALOG 75/25 MIX Inject 10 Units into the skin daily before supper.   LIPITOR 40 MG tablet Generic drug:  atorvastatin Take 40 mg by mouth at bedtime.   mirtazapine 15 MG tablet Commonly known as:  REMERON Take 1 tablet (15 mg total) by mouth at bedtime.   omeprazole 20 MG capsule Commonly known as:  PRILOSEC Take 20 mg by mouth daily.   ondansetron 4 MG disintegrating  tablet Commonly known as:  ZOFRAN-ODT Take 4 mg by mouth every 8 (eight) hours as needed for nausea or vomiting.   potassium chloride 10 MEQ tablet Commonly known as:  K-DUR Take 10 mEq by mouth daily.   predniSONE 20 MG tablet Commonly known as:  DELTASONE Take 2 tablets (40 mg total) by mouth daily with breakfast. 40 mg daily for 4 days, 10 mg daily for 4 days and then stop   primidone 50 MG tablet Commonly known as:  MYSOLINE Take 50 mg by mouth 2 (two) times daily.   ranitidine 150 MG tablet Commonly known as:  ZANTAC Take 300 mg by mouth every evening.   sitaGLIPtin 25 MG tablet Commonly known as:  JANUVIA Take 25 mg by mouth daily.   tamsulosin 0.4 MG Caps capsule Commonly known as:  FLOMAX Take 1 capsule (0.4 mg total) by mouth daily.       Allergies:  Allergies  Allergen Reactions  . Demerol [Meperidine] Nausea And Vomiting    Nausea/vomiting    Family History: Family History  Problem Relation Age of Onset  . Hypertension Father   . Stroke Father   . Diabetes Mother     Social History:  reports that he quit smoking about 6 years ago. His smoking use included Pipe. He has never used smokeless tobacco. He reports that he drinks alcohol. He reports that he does not use drugs.  ROS:                                        Physical Exam: There were no vitals taken for this visit.  Constitutional: Well nourished. Alert and oriented, No acute distress. HEENT: [place] AT, moist mucus membranes. Trachea midline, no masses. Cardiovascular: No clubbing, cyanosis, or edema. Respiratory: Normal respiratory effort, no increased work of breathing. GI: Abdomen is soft, non tender, non distended, no abdominal masses. Liver and spleen not palpable.  No hernias appreciated.  Stool sample for occult testing is not indicated.   GU: No CVA tenderness.  No bladder fullness or masses.  Patient with circumcised/uncircumcised phallus. ***Foreskin easily  retracted***  Urethral meatus is patent.  No penile discharge. No penile lesions or rashes. Scrotum without lesions, cysts, rashes and/or edema.  Testicles are located scrotally bilaterally. No masses are appreciated in the testicles. Left and right epididymis are normal. Rectal: Patient with  normal sphincter tone. Anus and perineum without scarring or rashes. No rectal masses are appreciated. Prostate is approximately *** grams, *** nodules are appreciated. Seminal vesicles are normal. Skin: No rashes, bruises or suspicious lesions. Lymph:  No cervical or inguinal adenopathy. Neurologic: Grossly intact, no focal deficits, moving all 4 extremities. Psychiatric: Normal mood and affect.  Laboratory Data: Lab Results  Component Value Date   WBC 9.2 12/04/2016   HGB 10.4 (L) 12/04/2016   HCT 30.0 (L) 12/04/2016   MCV 91.1 12/04/2016   PLT 378 12/04/2016    Lab Results  Component Value Date   CREATININE 1.70 (H) 12/03/2016    Lab Results  Component Value Date   HGBA1C 6.7 (H) 02/27/2013    Lab Results  Component Value Date   TSH 2.708 02/13/2016    Lab Results  Component Value Date   AST 25 11/19/2016   Lab Results  Component Value Date   ALT 11 (L) 11/19/2016    Pertinent Imaging: CLINICAL DATA:  Acute renal failure  EXAM: RENAL / URINARY TRACT ULTRASOUND COMPLETE  COMPARISON:  Abdominal and pelvic CT scan of March 02, 2015  FINDINGS: Right Kidney:  Length: 10.6 cm. The renal cortical echotexture remains lower than that of the adjacent liver. There are multiple cortical cysts with the largest rising from the lower pole measuring 5.5 x 5.3 x 6.3 cm. There is no hydronephrosis.  Left Kidney:  Length: 10.7 cm. The renal cortical echotexture is similar to that on the right. There are multiple cortical cysts. The largest lies in the upper pole measuring 4.1 x 3.3 x 3.8 cm. There is no hydronephrosis.  Bladder:  The urinary bladder was not visualized  due to the presence of Foley catheter.  IMPRESSION: Multiple known cysts in the kidneys. No hydronephrosis nor definite stones. The renal cortical echotexture remains normal.  The urinary bladder was decompressed by a Foley catheter.   Electronically Signed   By: David  Martinique M.D.   On: 11/19/2016 16:14   Assessment & Plan:    1. Acute urinary retention:     - foley catheter removed  - voiding trial today    - continue tamsulosin 0.4 mg daily; refills given  - return if unable to urinate or experiencing suprapubic discomfort  - follow-up in one month for I PSS score and PVR  2. Prostate cancer  - patient DNR     No Follow-up on file.  These notes generated with voice recognition software. I apologize for typographical errors.  Zara Council, [facility] [address] [place], [place] [address] [phone]

## 2016-12-07 ENCOUNTER — Ambulatory Visit: Admit: 2016-12-07 | Payer: No Typology Code available for payment source | Admitting: Podiatry

## 2016-12-07 ENCOUNTER — Encounter: Payer: Self-pay | Admitting: Urology

## 2016-12-07 ENCOUNTER — Ambulatory Visit (INDEPENDENT_AMBULATORY_CARE_PROVIDER_SITE_OTHER): Payer: Medicare Other | Admitting: Urology

## 2016-12-07 VITALS — BP 155/66 | HR 60 | Ht 73.0 in | Wt 163.0 lb

## 2016-12-07 DIAGNOSIS — Z8546 Personal history of malignant neoplasm of prostate: Secondary | ICD-10-CM | POA: Diagnosis not present

## 2016-12-07 DIAGNOSIS — N179 Acute kidney failure, unspecified: Secondary | ICD-10-CM | POA: Diagnosis not present

## 2016-12-07 DIAGNOSIS — Z8551 Personal history of malignant neoplasm of bladder: Secondary | ICD-10-CM

## 2016-12-07 DIAGNOSIS — N183 Chronic kidney disease, stage 3 (moderate): Secondary | ICD-10-CM

## 2016-12-07 DIAGNOSIS — R339 Retention of urine, unspecified: Secondary | ICD-10-CM | POA: Diagnosis not present

## 2016-12-07 SURGERY — BUNIONECTOMY
Anesthesia: Choice | Laterality: Left

## 2016-12-07 NOTE — Progress Notes (Signed)
12/07/2016 1:09 PM   Dean Garcia [birth year] [id number]  Referring provider: Kirk Ruths, MD [facility] [place], [place] [address]  Chief Complaint  Patient presents with  . New Patient (Initial Visit)    urine retention, foley removal    HPI: 83 year old male with a remote history of bladder and prostate cancer who presents today to establish care. He was recently hospitalized and discharged in 12/04/2016 for generalized weakness, shortness of breath, healthcare associated pneumonia.  He had a previous admission in a few weeks preceding this at which time for acute on chronic renal failure and urinary retention during his admission at which time a Foley catheter was placed.   This remains in place today.  Cr up to 4.51 from baseline from around 2, improved to 1.70 at the time of discharge with Foley.   He was started on Flomax at that time.       Renal ultrasound after Foley catheter was in place showed no evidence of hydroureteronephrosis.  No previous history of urinary retention.  He doesn't report any issues with urinary frequency, incomplete bladder emptying, weak stream, or any other signs or symptoms of retention prior to his admission in December.  He is not taking any prostate medications prior to this admission.  He denies any gross hematuria, dysuria, or urinary tract infections.  He does have a personal history of prostate cancer, dx in 1993 treated EBRT.  He was followed for "many years" without recurrence. He notes that his PSAs were essentially undetectable. He's not had a PSA in quite some time.  He also has a history of bladder cancer. He had a TURBT in 1995 at [facility] in [place], [place].  Based on his account, he had 2 small tumors removed from his bladder with no additional treatment. He was followed for 5 years with annual cystoscopy at which time no recurrence was identified.  Most recent  cross-sectional imaging in the form of CT abdomen and pelvis without contrast on 03/02/2015 shows no obvious evidence of metastatic disease. He's also been followed with serial chest CTs tree-in-bud lesions in the right upper and lower lobes which is improving, most recent CT 12/2015.  He is currently residing at [facility] for rehab.     PMH: Past Medical History:  Diagnosis Date  . Atrial fibrillation ([place])   . Bladder cancer ([place])   . Bundle branch block, right   . Carotid stenosis   . Colon polyp   . CVA (cerebral infarction)   . Diabetes ([place])   . Gout   . High cholesterol   . Hypertension   . Mitral valve prolapse   . Prostate cancer ([place])   . Tremor     Surgical History: Past Surgical History:  Procedure Laterality Date  . APPENDECTOMY      Home Medications:  Allergies as of 12/07/2016      Reactions   Demerol [meperidine] Nausea And Vomiting, Nausea Only   Other reaction(s): Nausea And Vomiting, Vomiting Nausea/vomiting      Medication List       Accurate as of 12/07/16  1:09 PM. Always use your most recent med list.          ACCU-CHEK COMPACT PLUS test strip Generic drug:  glucose blood   allopurinol 100 MG tablet Commonly known as:  ZYLOPRIM Take 100 mg by mouth daily.   amLODipine 5 MG tablet Commonly known as:  NORVASC Take 2.5 mg  by mouth daily.   cyanocobalamin 1000 MCG/ML injection Commonly known as:  (VITAMIN B-12) Inject 1,000 mcg into the muscle every 30 (thirty) days.   ELIQUIS 2.5 MG Tabs tablet Generic drug:  apixaban Take 2.5 mg by mouth 2 (two) times daily.   fexofenadine 180 MG tablet Commonly known as:  ALLEGRA Take 180 mg by mouth as needed for allergies or rhinitis.   furosemide 20 MG tablet Commonly known as:  LASIX Take 1 tablet (20 mg total) by mouth 2 (two) times daily.   insulin lispro protamine-lispro (75-25) 100 UNIT/ML Susp injection Commonly known as:  HUMALOG 75/25 MIX Inject 10 Units into the skin daily before  supper.   LIPITOR 40 MG tablet Generic drug:  atorvastatin Take 40 mg by mouth at bedtime.   mirtazapine 15 MG tablet Commonly known as:  REMERON Take 1 tablet (15 mg total) by mouth at bedtime.   omeprazole 20 MG capsule Commonly known as:  PRILOSEC Take 20 mg by mouth daily.   ondansetron 4 MG disintegrating tablet Commonly known as:  ZOFRAN-ODT Take 4 mg by mouth every 8 (eight) hours as needed for nausea or vomiting.   potassium chloride 10 MEQ tablet Commonly known as:  K-DUR Take 10 mEq by mouth daily.   predniSONE 20 MG tablet Commonly known as:  DELTASONE Take 2 tablets (40 mg total) by mouth daily with breakfast. 40 mg daily for 4 days, 10 mg daily for 4 days and then stop   primidone 50 MG tablet Commonly known as:  MYSOLINE Take 50 mg by mouth 2 (two) times daily.   ranitidine 150 MG tablet Commonly known as:  ZANTAC Take 300 mg by mouth every evening.   sitaGLIPtin 25 MG tablet Commonly known as:  JANUVIA Take 25 mg by mouth daily.   tamsulosin 0.4 MG Caps capsule Commonly known as:  FLOMAX Take 1 capsule (0.4 mg total) by mouth daily.       Allergies:  Allergies  Allergen Reactions  . Demerol [Meperidine] Nausea And Vomiting and Nausea Only    Other reaction(s): Nausea And Vomiting, Vomiting Nausea/vomiting    Family History: Family History  Problem Relation Age of Onset  . Hypertension Father   . Stroke Father   . Diabetes Mother     Social History:  reports that he quit smoking about 6 years ago. His smoking use included Pipe. He has never used smokeless tobacco. He reports that he drinks alcohol. He reports that he does not use drugs.  ROS: UROLOGY Frequent Urination?: No Hard to postpone urination?: No Burning/pain with urination?: No Get up at night to urinate?: No Leakage of urine?: No Urine stream starts and stops?: No Trouble starting stream?: No Do you have to strain to urinate?: No Blood in urine?: No Urinary tract  infection?: No Sexually transmitted disease?: No Injury to kidneys or bladder?: No Painful intercourse?: No Weak stream?: No Erection problems?: Yes Penile pain?: No  Gastrointestinal Nausea?: Yes Vomiting?: No Indigestion/heartburn?: Yes Diarrhea?: Yes Constipation?: Yes  Constitutional Fever: No Night sweats?: No Weight loss?: Yes Fatigue?: Yes  Skin Skin rash/lesions?: No Itching?: Yes  Eyes Blurred vision?: No Double vision?: No  Ears/Nose/Throat Sore throat?: No Sinus problems?: No  Hematologic/Lymphatic Swollen glands?: No Easy bruising?: Yes  Cardiovascular Leg swelling?: Yes Chest pain?: No  Respiratory Cough?: No Shortness of breath?: Yes  Endocrine Excessive thirst?: No  Musculoskeletal Back pain?: No Joint pain?: Yes  Neurological Headaches?: No Dizziness?: No  Psychologic Depression?: No Anxiety?:  Yes  Physical Exam: BP (!) 155/66   Pulse 60   Ht 6\' 1"  (1.854 m)   Wt 163 lb (73.9 kg)   BMI 21.51 kg/m   Constitutional:  Alert and oriented, No acute distress.  In a wheelchair. Frail-appearing. Accompanied by wife.  Excellent historian. HEENT: [place] AT, moist mucus membranes.  Trachea midline, no masses. Cardiovascular: No clubbing, cyanosis, or edema. Respiratory: Normal respiratory effort, no increased work of breathing. GI: Abdomen is soft, nontender, nondistended, no abdominal masses GU: Circumcised phallus with Foley catheter, 14 French in place during clear yellow urine.  Testicles descended bilaterally. Skin: No rashes, bruises or suspicious lesions. Rectal exam: Significantly decreased rectal tone with perianal stool appreciated. Prostate somewhat difficult to palpate, small, no obvious nodules. Neurologic: Grossly intact, no focal deficits, moving all 4 extremities.  Unable to assess ambulation due to wheelchair. Unsteady on feet. Psychiatric: Normal mood and affect.  Laboratory Data: Lab Results  Component Value Date    WBC 9.2 12/04/2016   HGB 10.4 (L) 12/04/2016   HCT 30.0 (L) 12/04/2016   MCV 91.1 12/04/2016   PLT 378 12/04/2016    Lab Results  Component Value Date   CREATININE 1.70 (H) 12/03/2016    Lab Results  Component Value Date   HGBA1C 6.7 (H) 02/27/2013    Urinalysis    Component Value Date/Time   COLORURINE YELLOW (A) 11/19/2016 1521   APPEARANCEUR CLEAR (A) 11/19/2016 1521   APPEARANCEUR Clear 03/02/2015 2019   LABSPEC 1.013 11/19/2016 1521   LABSPEC 1.009 03/02/2015 2019   PHURINE 5.0 11/19/2016 1521   GLUCOSEU NEGATIVE 11/19/2016 1521   GLUCOSEU Negative 03/02/2015 2019   HGBUR NEGATIVE 11/19/2016 1521   BILIRUBINUR NEGATIVE 11/19/2016 1521   BILIRUBINUR Negative 03/02/2015 2019   KETONESUR NEGATIVE 11/19/2016 1521   PROTEINUR NEGATIVE 11/19/2016 1521   NITRITE NEGATIVE 11/19/2016 1521   LEUKOCYTESUR NEGATIVE 11/19/2016 1521   LEUKOCYTESUR Negative 03/02/2015 2019    Pertinent Imaging: Renal ultrasound from 11/19/2016 as well as previous CT abdomen pelvis from 3/16 reviewed personally today.  Assessment & Plan:    1. Urinary retention Etiology unclear at this point Continue Flomax Bladder management options were reviewed today in detail. Given that he currently resides at 20 legs were numb and catheterization is possible, I recommended discontinuation of his Foley catheter today with voiding as possible and straight catheterizations each evening after voiding to check post void residuals. If asked him to keep a record of this for at least a week and fax it to [place] for further evaluation. If he is unable to urinate, he will need to be catheterized up to 5 times a day which was ordered. Plan for recheck in 4 weeks. - PSA  2. History of prostate cancer Check PSA today to r/o prostate cancer recurrence as contributing factor to retention Will only worry if markedly elevated in the setting of recent retention and Foley catheter  3. History of bladder cancer Dx' 95  without obvious recurrence  4. Acute renal failure superimposed on stage 3 chronic kidney disease, unspecified acute renal failure type ([place]) Likely secondary to obstructive nephropathy, improved with Foley catheter placement. Currently at baseline Bladder management as above. Mr. Devi has an upcoming appointment with nephrology.   Return in about 1 month (around 01/07/2017) for PVR, IPSS.  Hollice Espy, MD  [facility] [address] [place], [place] [address] [phone]

## 2016-12-07 NOTE — Progress Notes (Signed)
Catheter Removal  Patient is present today for a catheter removal.  8 ml of water was drained from the balloon. A 16 french foley cath was removed from the bladder no complications were noted . Patient tolerated well.  Preformed by: K.Russell,CMA

## 2016-12-08 LAB — PSA: Prostate Specific Ag, Serum: <0.1 ng/mL (ref 0.0–4.0)

## 2016-12-18 ENCOUNTER — Ambulatory Visit: Payer: Self-pay | Admitting: Urology

## 2016-12-18 ENCOUNTER — Other Ambulatory Visit: Payer: Self-pay | Admitting: Urology

## 2016-12-21 ENCOUNTER — Telehealth: Payer: Self-pay

## 2016-12-21 NOTE — Telephone Encounter (Signed)
Pt wife returned call stating she just brought pt home from [facility]. Per wife she nor pt are comfortable with performing CIC. Wife then stated that she will make arrangements to have CIC completed. Wife will return call when arrangements are made.

## 2016-12-21 NOTE — Telephone Encounter (Signed)
-----   Message from Hollice Espy, MD sent at 12/19/2016  1:54 PM EST ----- Please have [facility] continue daily catheterization.  Some of these volumes are still relatively high.  Keep record again for the next two weeks and fax back.    Hollice Espy, MD

## 2016-12-21 NOTE — Telephone Encounter (Signed)
LMOM

## 2016-12-26 ENCOUNTER — Telehealth: Payer: Self-pay

## 2016-12-26 NOTE — Telephone Encounter (Signed)
Spoke with pt wife in reference to performing CIC now that pt is at home. Per Dr. Erlene Quan pt needs to at least come in for a bladder scan prior to appt. Spoke with pt wife and nurse visit was made for Friday morning.

## 2016-12-28 ENCOUNTER — Ambulatory Visit (INDEPENDENT_AMBULATORY_CARE_PROVIDER_SITE_OTHER): Payer: Medicare Other

## 2016-12-28 VITALS — BP 121/71 | HR 74 | Ht 73.0 in | Wt 158.3 lb

## 2016-12-28 DIAGNOSIS — R339 Retention of urine, unspecified: Secondary | ICD-10-CM

## 2016-12-28 LAB — BLADDER SCAN AMB NON-IMAGING: SCAN RESULT: 80

## 2016-12-28 NOTE — Progress Notes (Signed)
Bladder Scan:80 Patient can void:  Performed By: Toniann Fail, LPN    Blood pressure 121/71, pulse 74, height 6\' 1"  (1.854 m), weight 158 lb 4.8 oz (71.8 kg).it.

## 2017-01-06 NOTE — Progress Notes (Signed)
01/07/2017 11:14 AM   Dean Garcia 1928-01-30 [id number]  Referring provider: Kirk Ruths, MD [place], [place] [address]  Chief Complaint  Patient presents with  . Benign Prostatic Hypertrophy    4 week follow up   . Prostate Cancer    History of and Hx of bladder cancer    HPI: Patient is an 83 year old Caucasian male who presents today for a one month follow up for acute on chronic renal failure and urinary retention.    Background history Patient with a remote history of bladder and prostate cancer who presents today to establish care. He was recently hospitalized and discharged in 12/04/2016 for generalized weakness, shortness of breath, healthcare associated pneumonia.  He had a previous admission in a few weeks preceding this at which time for acute on chronic renal failure and urinary retention during his admission at which time a Foley catheter was placed.   Cr up to 4.51 from baseline from around 2, improved to 1.70 at the time of discharge with Foley.   He was started on Flomax at that time.  Renal ultrasound after Foley catheter was in place showed no evidence of hydroureteronephrosis.  No previous history of urinary retention.  He doesn't report any issues with urinary frequency, incomplete bladder emptying, weak stream, or any other signs or symptoms of retention prior to his admission in December.  He is not taking any prostate medications prior to this admission.  He denies any gross hematuria, dysuria, or urinary tract infections.  He does have a personal history of prostate cancer, dx in 1993 treated EBRT.  He was followed for "many years" without recurrence. He notes that his PSA's were essentially undetectable.   Current PSA <0.1 12/2016.  He also has a history of bladder cancer. He had a TURBT in [facility] at [facility] in [place], [place].  Based on his account, he had 2 small tumors removed from his bladder  with no additional treatment. He was followed for 5 years with annual cystoscopy at which time no recurrence was identified.  Most recent cross-sectional imaging in the form of CT abdomen and pelvis without contrast on 03/02/2015 shows no obvious evidence of metastatic disease. He's also been followed with serial chest CTs tree-in-bud lesions in the right upper and lower lobes which is improving, most recent CT 12/2015.  He is currently residing at [facility] for rehab.    At his visit one month ago, it was decided to discontinue the Foley catheter and manage his urinary retention with CIC.  He has been getting straight cathed in the evenings with volumes ranging from 30 cc to 600 cc.  He has not been straight cathed in several days and is voiding on his own.  His I PSS score is 1/2.  His PVR is 35 mL.        IPSS    Row Name 01/07/17 1000         International Prostate Symptom Score   How often have you had the sensation of not emptying your bladder? Not at All     How often have you had to urinate less than every two hours? Not at All     How often have you found you stopped and started again several times when you urinated? Not at All     How often have you found it difficult to postpone urination? Not at All     How often  have you had a weak urinary stream? Less than 1 in 5 times     How often have you had to strain to start urination? Not at All     How many times did you typically get up at night to urinate? None     Total IPSS Score 1       Quality of Life due to urinary symptoms   If you were to spend the rest of your life with your urinary condition just the way it is now how would you feel about that? Mostly Satisfied        Score:  1-7 Mild 8-19 Moderate 20-35 Severe  PMH: Past Medical History:  Diagnosis Date  . Atrial fibrillation ([place])   . Bladder cancer ([place])   . Bundle branch block, right   . Carotid stenosis   . Colon polyp   . CVA (cerebral infarction)   .  Diabetes ([place])   . Gout   . High cholesterol   . Hypertension   . Mitral valve prolapse   . Prostate cancer ([place])   . Tremor     Surgical History: Past Surgical History:  Procedure Laterality Date  . APPENDECTOMY    . CAROTID ARTERY ANGIOPLASTY    . CHOLECYSTECTOMY      Home Medications:  Allergies as of 01/07/2017      Reactions   Demerol [meperidine] Nausea And Vomiting, Nausea Only   Other reaction(s): Nausea And Vomiting, Vomiting Nausea/vomiting      Medication List       Accurate as of 01/07/17 11:14 AM. Always use your most recent med list.          ACCU-CHEK COMPACT PLUS test strip Generic drug:  glucose blood   allopurinol 100 MG tablet Commonly known as:  ZYLOPRIM Take 100 mg by mouth daily.   amLODipine 5 MG tablet Commonly known as:  NORVASC Take 2.5 mg by mouth daily.   B-D ULTRAFINE III SHORT PEN 31G X 8 MM Misc Generic drug:  Insulin Pen Needle USE AS DIRECTED   cyanocobalamin 1000 MCG/ML injection Commonly known as:  (VITAMIN B-12) Inject 1,000 mcg into the muscle every 30 (thirty) days.   ELIQUIS 2.5 MG Tabs tablet Generic drug:  apixaban Take 2.5 mg by mouth 2 (two) times daily.   fexofenadine 180 MG tablet Commonly known as:  ALLEGRA Take 180 mg by mouth as needed for allergies or rhinitis.   furosemide 20 MG tablet Commonly known as:  LASIX Take 1 tablet (20 mg total) by mouth 2 (two) times daily.   insulin lispro protamine-lispro (75-25) 100 UNIT/ML Susp injection Commonly known as:  HUMALOG 75/25 MIX Inject 10 Units into the skin daily before supper.   LIPITOR 40 MG tablet Generic drug:  atorvastatin Take 40 mg by mouth at bedtime.   LORazepam 0.5 MG tablet Commonly known as:  ATIVAN Take by mouth.   mirtazapine 15 MG tablet Commonly known as:  REMERON Take 1 tablet (15 mg total) by mouth at bedtime.   omeprazole 20 MG capsule Commonly known as:  PRILOSEC Take 20 mg by mouth daily.   ondansetron 4 MG disintegrating  tablet Commonly known as:  ZOFRAN-ODT Take 4 mg by mouth every 8 (eight) hours as needed for nausea or vomiting.   potassium chloride 10 MEQ tablet Commonly known as:  K-DUR Take 10 mEq by mouth daily.   predniSONE 20 MG tablet Commonly known as:  DELTASONE Take 2 tablets (40 mg total) by  mouth daily with breakfast. 40 mg daily for 4 days, 10 mg daily for 4 days and then stop   primidone 50 MG tablet Commonly known as:  MYSOLINE Take 50 mg by mouth 2 (two) times daily.   ranitidine 150 MG tablet Commonly known as:  ZANTAC Take 300 mg by mouth every evening.   sitaGLIPtin 25 MG tablet Commonly known as:  JANUVIA Take 25 mg by mouth daily.   tamsulosin 0.4 MG Caps capsule Commonly known as:  FLOMAX Take 1 capsule (0.4 mg total) by mouth daily.       Allergies:  Allergies  Allergen Reactions  . Demerol [Meperidine] Nausea And Vomiting and Nausea Only    Other reaction(s): Nausea And Vomiting, Vomiting Nausea/vomiting    Family History: Family History  Problem Relation Age of Onset  . Hypertension Father   . Stroke Father   . Diabetes Mother   . Prostate cancer Neg Hx   . Kidney cancer Neg Hx   . Kidney disease Neg Hx     Social History:  reports that he quit smoking about 6 years ago. His smoking use included Pipe. He has never used smokeless tobacco. He reports that he drinks alcohol. He reports that he does not use drugs.  ROS: UROLOGY Frequent Urination?: No Hard to postpone urination?: No Burning/pain with urination?: No Get up at night to urinate?: No Leakage of urine?: No Urine stream starts and stops?: No Trouble starting stream?: No Do you have to strain to urinate?: No Blood in urine?: No Urinary tract infection?: No Sexually transmitted disease?: No Injury to kidneys or bladder?: No Painful intercourse?: No Weak stream?: No Erection problems?: No Penile pain?: No  Gastrointestinal Nausea?: No Vomiting?: No Indigestion/heartburn?:  No Diarrhea?: Yes Constipation?: No  Constitutional Fever: No Night sweats?: No Weight loss?: No Fatigue?: No  Skin Skin rash/lesions?: No Itching?: Yes  Eyes Blurred vision?: No Double vision?: No  Ears/Nose/Throat Sore throat?: No Sinus problems?: No  Hematologic/Lymphatic Swollen glands?: No Easy bruising?: Yes  Cardiovascular Leg swelling?: Yes Chest pain?: No  Respiratory Cough?: No Shortness of breath?: Yes  Endocrine Excessive thirst?: No  Musculoskeletal Back pain?: No Joint pain?: Yes  Neurological Headaches?: No Dizziness?: No  Psychologic Depression?: No Anxiety?: Yes  Physical Exam: BP (!) 148/76   Pulse 64   Ht 5\' 11"  (1.803 m)   Wt 162 lb 3.2 oz (73.6 kg)   BMI 22.62 kg/m   Constitutional:  Alert and oriented, No acute distress.  In a wheelchair. Frail-appearing. Accompanied by wife.  Excellent historian. HEENT: [place] AT, moist mucus membranes.  Trachea midline, no masses. Cardiovascular: No clubbing, cyanosis, or edema. Respiratory: Normal respiratory effort, no increased work of breathing. GI: Abdomen is soft, nontender, nondistended, no abdominal masses Skin: No rashes, bruises or suspicious lesions. Neurologic: Grossly intact, no focal deficits, moving all 4 extremities.  Unable to assess ambulation due to wheelchair. Unsteady on feet. Psychiatric: Normal mood and affect.  Laboratory Data: Lab Results  Component Value Date   WBC 9.2 12/04/2016   HGB 10.4 (L) 12/04/2016   HCT 30.0 (L) 12/04/2016   MCV 91.1 12/04/2016   PLT 378 12/04/2016    Lab Results  Component Value Date   CREATININE 1.70 (H) 12/03/2016    Lab Results  Component Value Date   HGBA1C 6.7 (H) 02/27/2013    Assessment & Plan:    1. Urinary retention  - Etiology remains unclear at this point  - Continue Flomax; refills given  -  Bladder management options were reviewed today  2. History of prostate cancer  - current PSA is <0.1 ng/mL  - recheck  PSA in one year  3. History of bladder cancer   - Dx' 95 without obvious recurrence  4. Acute renal failure superimposed on stage 3 chronic kidney disease, unspecified acute renal failure type ([place]) Likely secondary to obstructive nephropathy, improved with CIC, now voiding on his own Currently at baseline Bladder management as above. Mr. Serrette has seen nephrology.   Return in about 1 year (around 01/07/2018) for IPSS, PVR, PSA and exam.  Zara Council, [facility]  [place] [address] [place], [place] [address] [phone]

## 2017-01-07 ENCOUNTER — Encounter: Payer: Self-pay | Admitting: Urology

## 2017-01-07 ENCOUNTER — Ambulatory Visit (INDEPENDENT_AMBULATORY_CARE_PROVIDER_SITE_OTHER): Payer: Medicare Other | Admitting: Urology

## 2017-01-07 VITALS — BP 148/76 | HR 64 | Ht 71.0 in | Wt 162.2 lb

## 2017-01-07 DIAGNOSIS — Z8551 Personal history of malignant neoplasm of bladder: Secondary | ICD-10-CM

## 2017-01-07 DIAGNOSIS — N183 Chronic kidney disease, stage 3 unspecified: Secondary | ICD-10-CM

## 2017-01-07 DIAGNOSIS — R339 Retention of urine, unspecified: Secondary | ICD-10-CM | POA: Diagnosis not present

## 2017-01-07 DIAGNOSIS — Z8546 Personal history of malignant neoplasm of prostate: Secondary | ICD-10-CM | POA: Diagnosis not present

## 2017-01-07 LAB — BLADDER SCAN AMB NON-IMAGING: Scan Result: 35

## 2017-01-07 MED ORDER — TAMSULOSIN HCL 0.4 MG PO CAPS: 0.4000 mg | ORAL_CAPSULE | Freq: Every day | ORAL | 3 refills | Status: DC

## 2017-01-09 ENCOUNTER — Other Ambulatory Visit: Payer: Self-pay | Admitting: Psychiatry

## 2017-01-19 ENCOUNTER — Emergency Department: Payer: Medicare Other

## 2017-01-19 ENCOUNTER — Inpatient Hospital Stay
Admission: EM | Admit: 2017-01-19 | Discharge: 2017-01-24 | DRG: 918 | Disposition: A | Discharge status: Skilled Nursing Facility | Payer: Medicare Other | Attending: Internal Medicine | Admitting: Internal Medicine

## 2017-01-19 ENCOUNTER — Encounter: Payer: Self-pay | Admitting: Emergency Medicine

## 2017-01-19 DIAGNOSIS — E160 Drug-induced hypoglycemia without coma: Secondary | ICD-10-CM | POA: Diagnosis present

## 2017-01-19 DIAGNOSIS — F321 Major depressive disorder, single episode, moderate: Secondary | ICD-10-CM | POA: Diagnosis present

## 2017-01-19 DIAGNOSIS — F329 Major depressive disorder, single episode, unspecified: Secondary | ICD-10-CM

## 2017-01-19 DIAGNOSIS — R252 Cramp and spasm: Secondary | ICD-10-CM | POA: Diagnosis present

## 2017-01-19 DIAGNOSIS — N179 Acute kidney failure, unspecified: Secondary | ICD-10-CM

## 2017-01-19 DIAGNOSIS — N183 Chronic kidney disease, stage 3 (moderate): Secondary | ICD-10-CM | POA: Diagnosis present

## 2017-01-19 DIAGNOSIS — E1151 Type 2 diabetes mellitus with diabetic peripheral angiopathy without gangrene: Secondary | ICD-10-CM | POA: Diagnosis present

## 2017-01-19 DIAGNOSIS — T383X2A Poisoning by insulin and oral hypoglycemic [antidiabetic] drugs, intentional self-harm, initial encounter: Secondary | ICD-10-CM | POA: Diagnosis present

## 2017-01-19 DIAGNOSIS — Z79899 Other long term (current) drug therapy: Secondary | ICD-10-CM

## 2017-01-19 DIAGNOSIS — L89622 Pressure ulcer of left heel, stage 2: Secondary | ICD-10-CM | POA: Diagnosis present

## 2017-01-19 DIAGNOSIS — E162 Hypoglycemia, unspecified: Secondary | ICD-10-CM | POA: Diagnosis present

## 2017-01-19 DIAGNOSIS — F32A Depression, unspecified: Secondary | ICD-10-CM

## 2017-01-19 DIAGNOSIS — T383X5A Adverse effect of insulin and oral hypoglycemic [antidiabetic] drugs, initial encounter: Secondary | ICD-10-CM | POA: Diagnosis present

## 2017-01-19 DIAGNOSIS — M109 Gout, unspecified: Secondary | ICD-10-CM | POA: Diagnosis present

## 2017-01-19 DIAGNOSIS — Z87891 Personal history of nicotine dependence: Secondary | ICD-10-CM | POA: Diagnosis not present

## 2017-01-19 DIAGNOSIS — I129 Hypertensive chronic kidney disease with stage 1 through stage 4 chronic kidney disease, or unspecified chronic kidney disease: Secondary | ICD-10-CM | POA: Diagnosis present

## 2017-01-19 DIAGNOSIS — E119 Type 2 diabetes mellitus without complications: Secondary | ICD-10-CM

## 2017-01-19 DIAGNOSIS — Z8551 Personal history of malignant neoplasm of bladder: Secondary | ICD-10-CM

## 2017-01-19 DIAGNOSIS — F0390 Unspecified dementia without behavioral disturbance: Secondary | ICD-10-CM | POA: Diagnosis present

## 2017-01-19 DIAGNOSIS — Z8546 Personal history of malignant neoplasm of prostate: Secondary | ICD-10-CM | POA: Diagnosis not present

## 2017-01-19 DIAGNOSIS — G8929 Other chronic pain: Secondary | ICD-10-CM | POA: Diagnosis present

## 2017-01-19 DIAGNOSIS — G629 Polyneuropathy, unspecified: Secondary | ICD-10-CM | POA: Diagnosis present

## 2017-01-19 DIAGNOSIS — Z888 Allergy status to other drugs, medicaments and biological substances status: Secondary | ICD-10-CM

## 2017-01-19 DIAGNOSIS — Z7901 Long term (current) use of anticoagulants: Secondary | ICD-10-CM

## 2017-01-19 DIAGNOSIS — E1122 Type 2 diabetes mellitus with diabetic chronic kidney disease: Secondary | ICD-10-CM | POA: Diagnosis present

## 2017-01-19 DIAGNOSIS — Z8673 Personal history of transient ischemic attack (TIA), and cerebral infarction without residual deficits: Secondary | ICD-10-CM

## 2017-01-19 DIAGNOSIS — Z833 Family history of diabetes mellitus: Secondary | ICD-10-CM

## 2017-01-19 DIAGNOSIS — Z794 Long term (current) use of insulin: Secondary | ICD-10-CM

## 2017-01-19 DIAGNOSIS — Z86010 Personal history of colonic polyps: Secondary | ICD-10-CM

## 2017-01-19 DIAGNOSIS — E7800 Pure hypercholesterolemia, unspecified: Secondary | ICD-10-CM | POA: Diagnosis present

## 2017-01-19 DIAGNOSIS — Z8249 Family history of ischemic heart disease and other diseases of the circulatory system: Secondary | ICD-10-CM

## 2017-01-19 DIAGNOSIS — I4891 Unspecified atrial fibrillation: Secondary | ICD-10-CM | POA: Diagnosis present

## 2017-01-19 LAB — CBC WITH DIFFERENTIAL/PLATELET
Basophils Absolute: 0 10*3/uL (ref 0–0.1)
Basophils Relative: 0 %
Eosinophils Absolute: 0 10*3/uL (ref 0–0.7)
Eosinophils Relative: 0 %
HEMATOCRIT: 34.6 % — AB (ref 40.0–52.0)
HEMOGLOBIN: 11 g/dL — AB (ref 13.0–18.0)
LYMPHS ABS: 0.2 10*3/uL — AB (ref 1.0–3.6)
Lymphocytes Relative: 2 %
MCH: 29 pg (ref 26.0–34.0)
MCHC: 31.7 g/dL — ABNORMAL LOW (ref 32.0–36.0)
MCV: 91.5 fL (ref 80.0–100.0)
MONO ABS: 0.7 10*3/uL (ref 0.2–1.0)
MONOS PCT: 6 %
NEUTROS ABS: 10.4 10*3/uL — AB (ref 1.4–6.5)
NEUTROS PCT: 92 %
Platelets: 236 10*3/uL (ref 150–440)
RBC: 3.78 MIL/uL — ABNORMAL LOW (ref 4.40–5.90)
RDW: 15.6 % — AB (ref 11.5–14.5)
WBC: 11.3 10*3/uL — ABNORMAL HIGH (ref 3.8–10.6)

## 2017-01-19 LAB — COMPREHENSIVE METABOLIC PANEL
ALK PHOS: 56 U/L (ref 38–126)
ALT: 9 U/L — ABNORMAL LOW (ref 17–63)
AST: 24 U/L (ref 15–41)
Albumin: 2.8 g/dL — ABNORMAL LOW (ref 3.5–5.0)
Anion gap: 8 (ref 5–15)
BILIRUBIN TOTAL: 0.5 mg/dL (ref 0.3–1.2)
BUN: 37 mg/dL — ABNORMAL HIGH (ref 6–20)
CALCIUM: 8.2 mg/dL — AB (ref 8.9–10.3)
CO2: 29 mmol/L (ref 22–32)
Chloride: 100 mmol/L — ABNORMAL LOW (ref 101–111)
Creatinine, Ser: 2.36 mg/dL — ABNORMAL HIGH (ref 0.61–1.24)
GFR, EST AFRICAN AMERICAN: 27 mL/min — AB (ref >60)
GFR, EST NON AFRICAN AMERICAN: 23 mL/min — AB (ref >60)
GLUCOSE: 142 mg/dL — AB (ref 65–99)
POTASSIUM: 4.4 mmol/L (ref 3.5–5.1)
Sodium: 137 mmol/L (ref 135–145)
TOTAL PROTEIN: 5.8 g/dL — AB (ref 6.5–8.1)

## 2017-01-19 LAB — GLUCOSE, CAPILLARY
GLUCOSE-CAPILLARY: 27 mg/dL — AB (ref 65–99)
GLUCOSE-CAPILLARY: 88 mg/dL (ref 65–99)
GLUCOSE-CAPILLARY: 75 mg/dL (ref 65–99)
GLUCOSE-CAPILLARY: 106 mg/dL — AB (ref 65–99)
GLUCOSE-CAPILLARY: 45 mg/dL — AB (ref 65–99)
GLUCOSE-CAPILLARY: 90 mg/dL (ref 65–99)
GLUCOSE-CAPILLARY: 67 mg/dL (ref 65–99)
GLUCOSE-CAPILLARY: 92 mg/dL (ref 65–99)
GLUCOSE-CAPILLARY: 158 mg/dL — AB (ref 65–99)
Glucose-Capillary: 97 mg/dL (ref 65–99)
Glucose-Capillary: 103 mg/dL — ABNORMAL HIGH (ref 65–99)

## 2017-01-19 LAB — URINALYSIS, COMPLETE (UACMP) WITH MICROSCOPIC
BACTERIA UA: NONE SEEN
Bilirubin Urine: NEGATIVE
Glucose, UA: 50 mg/dL — AB
HGB URINE DIPSTICK: NEGATIVE
Ketones, ur: NEGATIVE mg/dL
Leukocytes, UA: NEGATIVE
NITRITE: NEGATIVE
PH: 7 (ref 5.0–8.0)
Protein, ur: NEGATIVE mg/dL
SPECIFIC GRAVITY, URINE: 1.014 (ref 1.005–1.030)

## 2017-01-19 LAB — TROPONIN I

## 2017-01-19 MED ORDER — DEXTROSE-NACL 5-0.9 % IV SOLN
INTRAVENOUS | Status: DC
  Administered 2017-01-19 – 2017-01-20 (×3): via INTRAVENOUS

## 2017-01-19 MED ORDER — INSULIN ASPART 100 UNIT/ML ~~LOC~~ SOLN
0.0000 [IU] | Freq: Three times a day (TID) | SUBCUTANEOUS | Status: DC
  Administered 2017-01-20 – 2017-01-21 (×4): 1 [IU] via SUBCUTANEOUS
  Filled 2017-01-19 (×4): qty 1

## 2017-01-19 MED ORDER — SODIUM CHLORIDE 0.9 % IV SOLN: Freq: Once | INTRAVENOUS | Status: DC

## 2017-01-19 MED ORDER — DEXTROSE 50 % IV SOLN
1.0000 | Freq: Once | INTRAVENOUS | Status: AC
  Administered 2017-01-19: 50 mL via INTRAVENOUS

## 2017-01-19 MED ORDER — PREGABALIN 50 MG PO CAPS
50.0000 mg | ORAL_CAPSULE | Freq: Two times a day (BID) | ORAL | Status: DC
  Administered 2017-01-19 – 2017-01-22 (×6): 50 mg via ORAL
  Filled 2017-01-19 (×6): qty 1

## 2017-01-19 MED ORDER — GABAPENTIN 100 MG PO CAPS
100.0000 mg | ORAL_CAPSULE | Freq: Once | ORAL | Status: AC
  Administered 2017-01-19: 100 mg via ORAL
  Filled 2017-01-19: qty 1

## 2017-01-19 MED ORDER — DEXTROSE 50 % IV SOLN
INTRAVENOUS | Status: AC
  Filled 2017-01-19: qty 50

## 2017-01-19 MED ORDER — HYDROCODONE-ACETAMINOPHEN 10-325 MG PO TABS
1.0000 | ORAL_TABLET | ORAL | Status: DC | PRN
  Administered 2017-01-20 – 2017-01-23 (×4): 1 via ORAL
  Filled 2017-01-19 (×4): qty 1

## 2017-01-19 MED ORDER — DULOXETINE HCL 30 MG PO CPEP
30.0000 mg | ORAL_CAPSULE | Freq: Every day | ORAL | Status: DC
  Administered 2017-01-19 – 2017-01-24 (×6): 30 mg via ORAL
  Filled 2017-01-19 (×6): qty 1

## 2017-01-19 NOTE — ED Notes (Signed)
Report given to Kuch, RN.  

## 2017-01-19 NOTE — ED Notes (Signed)
NAD noted at this time. Tammy, EDT-P at bedside as 1:1 sitter. Pt sitting in bed watching golf at this time. Denies any needs, or any symptoms of hypoglycemia. Will continue to monitor for further patient needs.

## 2017-01-19 NOTE — ED Notes (Signed)
Pt given 8oz of apple juice at this time.

## 2017-01-19 NOTE — ED Notes (Signed)
Pt assisted with the urinal. Pt was able to stand with assistance and after pt was finished pt was placed back in the bed w/o incident.

## 2017-01-19 NOTE — ED Notes (Signed)
This RN notified by MD that patient states he took his insulin on purpose as a suicide attempt. Per MD since patient's wife is at bedside pt does not need a 1:1 sitter.

## 2017-01-19 NOTE — ED Provider Notes (Signed)
[facility] Emergency Department Provider Note  ____________________________________________  Time seen: Approximately 11:51 AM  I have reviewed the triage vital signs and the nursing notes.   HISTORY  Chief Complaint Hypoglycemia   HPI Ivor Bloyd is a 83 y.o. male with a history of atrial fibrillation,DM, CVA, HTN, bladder and prostate cancer who presents for evaluation of hypoglycemia. Patient called EMS earlier this morning when he started feeling unwell and check his glucose which was in the 40s. EMS came out to the house and gave him an amp of D50 patient refused transfer. Patient then had bacon and eggs and lots of orange juice for breakfast. When he rechecked his sugar was again low and EMS was called again. He received 120cc of D10 on route with last blood glucose of 110. Patient reports that he feels back to normal. He denies any recent illnesses such as URI symptoms, cough, congestion, bodyaches, fever, chills, diarrhea, dysuria, hematuria. Also denies chest pain or shortness of breath, headache. Patient has not been on insulin for a month because his glucose was well controlled. He takes Januvia only. Upon arrival to the ED patient's BG was 27.  Patient's wife is now at the bedside and patient just told me and her that he took 10 units of subcutaneous Humalog at 5:30 AM in a suicide attempt. Patient reports that he has been very depressed due to his severe neuropathy. He has been dealing with this for multiple years and reports that his symptoms are getting worse. He told us that since he didn't have access to guns he figured that if he give him a a large dose of insulin he would be able to end it all. Patient has never tried to kill himself before.   Past Medical History:  Diagnosis Date  . Atrial fibrillation ([place])   . Bladder cancer ([place])   . Bundle branch block, right   . Carotid stenosis   . Colon polyp   . CVA (cerebral infarction)   . Diabetes  ([place])   . Gout   . High cholesterol   . Hypertension   . Mitral valve prolapse   . Prostate cancer ([place])   . Tremor     Patient Active Problem List   Diagnosis Date Noted  . Muscle weakness (generalized)   . SOB (shortness of breath)   . Palliative care by specialist   . HCAP (healthcare-associated pneumonia) 11/29/2016  . Pressure injury of skin 11/20/2016  . Palliative care encounter   . Goals of care, counseling/discussion   . Acute renal failure superimposed on chronic kidney disease ([place])   . ARF (acute renal failure) ([place]) 11/19/2016  . Moderate major depression, single episode ([place]) 10/22/2016  . Mild dementia 10/22/2016  . Paroxysmal atrial fibrillation ([place]) 02/21/2016  . A-fib ([place]) 02/12/2016  . Centrilobular emphysema ([place]) 12/27/2015  . B12 deficiency 04/12/2015  . Atherosclerosis of aorta ([place]) 10/23/2014  . 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 Has a tremor 07/17/2014  . Type 2 diabetes mellitus ([place]) 07/17/2014  . Edema 07/17/2014  . Billowing mitral valve 07/12/2014  . BP (high blood pressure) 07/12/2014  . Carotid artery narrowing 07/12/2014  . Impaired renal function 06/07/2014  . Proctitis, radiation 06/07/2014  . Malignant neoplasm of prostate ([place]) 06/07/2014  . Peripheral nerve disease ([place]) 06/07/2014  . Arthritis, degenerative 06/07/2014  . Disorder of peripheral nervous system ([place]) 06/07/2014  . Gout 06/07/2014  . 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 DD (diverticular disease) 06/07/2014  .  Disease of rectum 06/07/2014  . Diabetes mellitus ([place]) 06/07/2014  . Malignant neoplasm of urinary bladder ([place]) 06/07/2014    Past Surgical History:  Procedure Laterality Date  . APPENDECTOMY    . CAROTID ARTERY ANGIOPLASTY    . CHOLECYSTECTOMY      Prior to Admission medications   Medication Sig Start Date End Date Taking? Authorizing Provider  allopurinol (ZYLOPRIM) 100 MG tablet Take 100 mg by mouth daily.    Historical  Provider, MD  amLODipine (NORVASC) 5 MG tablet Take 2.5 mg by mouth daily.     Historical Provider, MD  apixaban (ELIQUIS) 2.5 MG TABS tablet Take 2.5 mg by mouth 2 (two) times daily.  02/21/16   Historical Provider, MD  atorvastatin (LIPITOR) 40 MG tablet Take 40 mg by mouth at bedtime.    Historical Provider, MD  cyanocobalamin (,VITAMIN B-12,) 1000 MCG/ML injection Inject 1,000 mcg into the muscle every 30 (thirty) days.  10/31/15   Historical Provider, MD  fexofenadine (ALLEGRA) 180 MG tablet Take 180 mg by mouth as needed for allergies or rhinitis.    Historical Provider, MD  furosemide (LASIX) 20 MG tablet Take 1 tablet (20 mg total) by mouth 2 (two) times daily. 12/04/16   Epifanio Lesches, MD  glucose blood (ACCU-CHEK COMPACT PLUS) test strip  12/07/15   Historical Provider, MD  insulin lispro protamine-lispro (HUMALOG 75/25 MIX) (75-25) 100 UNIT/ML SUSP injection Inject 10 Units into the skin daily before supper.    Historical Provider, MD  Insulin Pen Needle (B-D ULTRAFINE III SHORT PEN) 31G X 8 MM MISC USE AS DIRECTED 12/24/16   Historical Provider, MD  LORazepam (ATIVAN) 0.5 MG tablet Take by mouth. 01/03/17   Historical Provider, MD  mirtazapine (REMERON) 15 MG tablet Take 1 tablet (15 mg total) by mouth at bedtime. 10/22/16   Gonzella Lex, MD  omeprazole (PRILOSEC) 20 MG capsule Take 20 mg by mouth daily.  03/09/16 03/09/17  Historical Provider, MD  ondansetron (ZOFRAN-ODT) 4 MG disintegrating tablet Take 4 mg by mouth every 8 (eight) hours as needed for nausea or vomiting.     Historical Provider, MD  potassium chloride (K-DUR) 10 MEQ tablet Take 10 mEq by mouth daily.    Historical Provider, MD  predniSONE (DELTASONE) 20 MG tablet Take 2 tablets (40 mg total) by mouth daily with breakfast. 40 mg daily for 4 days, 10 mg daily for 4 days and then stop Patient not taking: Reported on 01/07/2017 12/04/16   Epifanio Lesches, MD  primidone (MYSOLINE) 50 MG tablet Take 50 mg by mouth 2 (two) times  daily.     Historical Provider, MD  ranitidine (ZANTAC) 150 MG tablet Take 300 mg by mouth every evening.     Historical Provider, MD  sitaGLIPtin (JANUVIA) 25 MG tablet Take 25 mg by mouth daily.     Historical Provider, MD  tamsulosin (FLOMAX) 0.4 MG CAPS capsule Take 1 capsule (0.4 mg total) by mouth daily. 01/07/17   Nori Riis, PA-C    Allergies Demerol [meperidine]  Family History  Problem Relation Age of Onset  . Hypertension Father   . Stroke Father   . Diabetes Mother   . Prostate cancer Neg Hx   . Kidney cancer Neg Hx   . Kidney disease Neg Hx     Social History Social History  Substance Use Topics  . Smoking status: Former Smoker    Types: Pipe    Quit date: 10/29/2010  . Smokeless tobacco: Never Used  .  Alcohol use Yes     Comment: 1 beer a day    Review of Systems  Constitutional: Negative for fever. Eyes: Negative for visual changes. ENT: Negative for sore throat. Neck: No neck pain  Cardiovascular: Negative for chest pain. Respiratory: Negative for shortness of breath. Gastrointestinal: Negative for abdominal pain, vomiting or diarrhea. Genitourinary: Negative for dysuria. Musculoskeletal: Negative for back pain. Skin: Negative for rash. Neurological: Negative for headaches, weakness or numbness. Psych: No SI or HI  ____________________________________________   PHYSICAL EXAM:  VITAL SIGNS: Vitals:   01/19/17 1205  BP: (!) 177/64  Pulse: (!) 49  Resp: 20  Temp: 97.5 F (36.4 C)   Constitutional: Alert and oriented. Well appearing and in no apparent distress. HEENT:      Head: Normocephalic and atraumatic.         Eyes: Conjunctivae are normal. Sclera is non-icteric. EOMI. PERRL      Mouth/Throat: Mucous membranes are moist.       Neck: Supple with no signs of meningismus. Cardiovascular: Regular rate and rhythm. No murmurs, gallops, or rubs. 2+ symmetrical distal pulses are present in all extremities. No JVD. Respiratory: Normal  respiratory effort. Lungs are clear to auscultation bilaterally. No wheezes, crackles, or rhonchi.  Gastrointestinal: Soft, non tender, and non distended with positive bowel sounds. No rebound or guarding. Musculoskeletal: Asymmetric swelling of b/l LE L>R with mild erythema of the LLE with no warmth or ttp. Neurologic: Normal speech and language. Face is symmetric. Moving all extremities. No gross focal neurologic deficits are appreciated. Skin: Skin is warm, dry and intact. No rash noted. Psychiatric: Mood and affect are blunt. Speech and behavior are normal.  ____________________________________________   LABS (all labs ordered are listed, but only abnormal results are displayed)  Labs Reviewed  CBC WITH DIFFERENTIAL/PLATELET - Abnormal; Notable for the following:       Result Value   WBC 11.3 (*)    RBC 3.78 (*)    Hemoglobin 11.0 (*)    HCT 34.6 (*)    MCHC 31.7 (*)    RDW 15.6 (*)    Neutro Abs 10.4 (*)    Lymphs Abs 0.2 (*)    All other components within normal limits  COMPREHENSIVE METABOLIC PANEL - Abnormal; Notable for the following:    Chloride 100 (*)    Glucose, Bld 142 (*)    BUN 37 (*)    Creatinine, Ser 2.36 (*)    Calcium 8.2 (*)    Total Protein 5.8 (*)    Albumin 2.8 (*)    ALT 9 (*)    GFR calc non Af Amer 23 (*)    GFR calc Af Amer 27 (*)    All other components within normal limits  GLUCOSE, CAPILLARY - Abnormal; Notable for the following:    Glucose-Capillary 27 (*)    All other components within normal limits  TROPONIN I  GLUCOSE, CAPILLARY  GLUCOSE, CAPILLARY  GLUCOSE, CAPILLARY  URINALYSIS, COMPLETE (UACMP) WITH MICROSCOPIC   ____________________________________________  EKG  ED ECG REPORT I, Rudene Re, the attending physician, personally viewed and interpreted this ECG.   Sinus bradycardia, rate of 51, prolonged QTC, right bundle branch block, right axis deviation, no ST elevations or  depressions. ____________________________________________  RADIOLOGY  CXR: . No convincing acute cardiopulmonary disease. 2. There is left greater than right lung base opacity has likely scarring, atelectasis or a combination, similar to the prior study. Consider pneumonia if there are consistent clinical symptoms. ____________________________________________   PROCEDURES  Procedure(s) performed: None Procedures Critical Care performed:  None ____________________________________________   INITIAL IMPRESSION / ASSESSMENT AND PLAN / ED COURSE   83 y.o. male with a history of atrial fibrillation,DM, CVA, HTN, bladder and prostate cancer who presents for evaluation of recurrent hypoglycemia this morning after taking an intentional dose of humalog in a suicide attempt. Patient is well-appearing, in no distress, is normal vital signs, blood glucose on arrival was 27.Will monitor closely. Check basic labs. Consult psych. Clinical Course as of Jan 20 1412  Sat Jan 19, 2017  1404 Patient with AKI with creatinine 2.36 (baseline 1.7). Will gently hydrate to avoid patient becoming hypoglycemic again and will admit to Hospitalist. Patient to be evaluated by [facility] psych.  [CV]    Clinical Course User Index [CV] Rudene Re, MD    Pertinent labs & imaging results that were available during my care of the patient were reviewed by me and considered in my medical decision making (see chart for details).    ____________________________________________   FINAL CLINICAL IMPRESSION(S) / ED DIAGNOSES  Final diagnoses:  AKI (acute kidney injury) ([place])  Hypoglycemia  Overdose of insulin, intentional self-harm, initial encounter ([place])      NEW MEDICATIONS STARTED DURING THIS VISIT:  New Prescriptions   No medications on file     Note:  This document was prepared using Dragon voice recognition software and may include unintentional dictation errors.    Rudene Re,  MD 01/19/17 1416

## 2017-01-19 NOTE — ED Notes (Addendum)
Report given to Lavella Lemons, Therapist, sports. This RN explained to Lavella Lemons, RN that MD was working on [employer] paperwork due to suicide attempt and that patient would need a 1:1 sitter.

## 2017-01-19 NOTE — ED Notes (Signed)
Pt transported to room 238

## 2017-01-19 NOTE — Progress Notes (Signed)
Because of persistent hypoglycemia,needs more close monitorign and checking  Blood glucose q2 hrs at [facility] for next 12 hrs.sp change to step down status.

## 2017-01-19 NOTE — ED Notes (Signed)
Pt visualized in NAD. Pt's wife at bedside with patient. Pt sitting in bed watching TV at this time. Will continue to monitor for further patient needs.

## 2017-01-19 NOTE — ED Triage Notes (Signed)
Pt presents to ED via [facility] with c/o hypoglycemia. Per EMS, EMS was dispatched to patient's house this morning and CBG was 26, pt received 1amp D50 at home then refused transport and ate breakfast, EMS also states they were called back out, CBG was 46, 12.5 grams D50 given en route to hospital, last CBG obtained by EMS 119. On patient arrival to ER CBG 27, MD notified, received VORB for 1amp D50. Pt is alert and oriented during triage at this time.

## 2017-01-19 NOTE — ED Notes (Signed)
Edison Nasuti sitting 1:1 with pt.

## 2017-01-19 NOTE — H&P (Signed)
History and Physical    Dean Garcia [id number] DOB: 03-14-1928 DOA: 01/19/2017  Referring physician: Dr. Alfred Levins PCP: Kirk Ruths., MD  Specialists: multiple  Chief Complaint: low blood sugars, depression  HPI: Dean Garcia is a 83 y.o. male has a past medical history significant for A-fib, CKD, PVD, and chronic LE pain now with hypoglycemia after taking overdose of insulin as a suicide attempt. Pain is "tired" of his chronic pain and "cannot go on like this any longer". In ER, pt given D50  With improvement of sugars but they remain low. He is now admitted. No fever. Denies CP or SOB.  Review of Systems: The patient denies anorexia, fever, weight loss,, vision loss, decreased hearing, hoarseness, chest pain, syncope, dyspnea on exertion, peripheral edema, balance deficits, hemoptysis, abdominal pain, melena, hematochezia, severe indigestion/heartburn, hematuria, incontinence, genital sores, muscle weakness, suspicious skin lesions, transient blindness, difficulty walking, depression, unusual weight change, abnormal bleeding, enlarged lymph nodes, angioedema, and breast masses.   Past Medical History:  Diagnosis Date  . Atrial fibrillation ([place])   . Bladder cancer ([place])   . Bundle branch block, right   . Carotid stenosis   . Colon polyp   . CVA (cerebral infarction)   . Diabetes ([place])   . Gout   . High cholesterol   . Hypertension   . Mitral valve prolapse   . Prostate cancer ([place])   . Tremor    Past Surgical History:  Procedure Laterality Date  . APPENDECTOMY    . CAROTID ARTERY ANGIOPLASTY    . CHOLECYSTECTOMY     Social History:  reports that he quit smoking about 6 years ago. His smoking use included Pipe. He has never used smokeless tobacco. He reports that he drinks alcohol. He reports that he does not use drugs.  Allergies  Allergen Reactions  . Demerol [Meperidine] Nausea And Vomiting and Nausea Only    Other reaction(s): Nausea And Vomiting,  Vomiting Nausea/vomiting    Family History  Problem Relation Age of Onset  . Hypertension Father   . Stroke Father   . Diabetes Mother   . Prostate cancer Neg Hx   . Kidney cancer Neg Hx   . Kidney disease Neg Hx     Prior to Admission medications   Medication Sig Start Date End Date Taking? Authorizing Provider  allopurinol (ZYLOPRIM) 100 MG tablet Take 100 mg by mouth daily.    Historical Provider, MD  amLODipine (NORVASC) 5 MG tablet Take 2.5 mg by mouth daily.     Historical Provider, MD  apixaban (ELIQUIS) 2.5 MG TABS tablet Take 2.5 mg by mouth 2 (two) times daily.  02/21/16   Historical Provider, MD  atorvastatin (LIPITOR) 40 MG tablet Take 40 mg by mouth at bedtime.    Historical Provider, MD  cyanocobalamin (,VITAMIN B-12,) 1000 MCG/ML injection Inject 1,000 mcg into the muscle every 30 (thirty) days.  10/31/15   Historical Provider, MD  fexofenadine (ALLEGRA) 180 MG tablet Take 180 mg by mouth as needed for allergies or rhinitis.    Historical Provider, MD  furosemide (LASIX) 20 MG tablet Take 1 tablet (20 mg total) by mouth 2 (two) times daily. 12/04/16   Epifanio Lesches, MD  glucose blood (ACCU-CHEK COMPACT PLUS) test strip  12/07/15   Historical Provider, MD  insulin lispro protamine-lispro (HUMALOG 75/25 MIX) (75-25) 100 UNIT/ML SUSP injection Inject 10 Units into the skin daily before supper.    Historical Provider, MD  Insulin Pen Needle (B-D ULTRAFINE III SHORT PEN)  31G X 8 MM MISC USE AS DIRECTED 12/24/16   Historical Provider, MD  LORazepam (ATIVAN) 0.5 MG tablet Take by mouth. 01/03/17   Historical Provider, MD  mirtazapine (REMERON) 15 MG tablet Take 1 tablet (15 mg total) by mouth at bedtime. 10/22/16   Gonzella Lex, MD  omeprazole (PRILOSEC) 20 MG capsule Take 20 mg by mouth daily.  03/09/16 03/09/17  Historical Provider, MD  ondansetron (ZOFRAN-ODT) 4 MG disintegrating tablet Take 4 mg by mouth every 8 (eight) hours as needed for nausea or vomiting.     Historical  Provider, MD  potassium chloride (K-DUR) 10 MEQ tablet Take 10 mEq by mouth daily.    Historical Provider, MD  predniSONE (DELTASONE) 20 MG tablet Take 2 tablets (40 mg total) by mouth daily with breakfast. 40 mg daily for 4 days, 10 mg daily for 4 days and then stop Patient not taking: Reported on 01/07/2017 12/04/16   Epifanio Lesches, MD  primidone (MYSOLINE) 50 MG tablet Take 50 mg by mouth 2 (two) times daily.     Historical Provider, MD  ranitidine (ZANTAC) 150 MG tablet Take 300 mg by mouth every evening.     Historical Provider, MD  sitaGLIPtin (JANUVIA) 25 MG tablet Take 25 mg by mouth daily.     Historical Provider, MD  tamsulosin (FLOMAX) 0.4 MG CAPS capsule Take 1 capsule (0.4 mg total) by mouth daily. 01/07/17   Nori Riis, PA-C   Physical Exam: Vitals:   01/19/17 1205 01/19/17 1206  BP: (!) 177/64   Pulse: (!) 49   Resp: 20   Temp: 97.5 F (36.4 C)   TempSrc: Oral   SpO2: 99%   Weight:  72.6 kg (160 lb)  Height:  6\' 1"  (1.854 m)     General:  No apparent distress, WDWN, /AT  Eyes: PERRL, EOMI, no scleral icterus, conjunctiva clear  ENT: moist oropharynx without exudate, TM's benign, dentition fair  Neck: supple, no lymphadenopathy. No bruits or thyromegaly  Cardiovascular: irregularly irregular without MRG; 1+ peripheral pulses, no JVD, trace peripheral edema  Respiratory: CTA biL, good air movement without wheezing, rhonchi or crackled. Respiratory effort normal  Abdomen: soft, non tender to palpation, positive bowel sounds, no guarding, no rebound  Skin: slight erythema with warmth and tenderness to LE below knees bialterally  Musculoskeletal: normal bulk and tone, no joint swelling  Psychiatric: normal mood and affect, A&OX3  Neurologic: CN 2-12 grossly intact, Motor strength 5/5 in all 4 groups with symmetric DTR's and stocking-glove neuropathy noted  Labs on Admission:  Basic Metabolic Panel:  Recent Labs Lab 01/19/17 1153  NA 137  K 4.4   CL 100*  CO2 29  GLUCOSE 142*  BUN 37*  CREATININE 2.36*  CALCIUM 8.2*   Liver Function Tests:  Recent Labs Lab 01/19/17 1153  AST 24  ALT 9*  ALKPHOS 56  BILITOT 0.5  PROT 5.8*  ALBUMIN 2.8*   No results for input(s): LIPASE, AMYLASE in the last 168 hours. No results for input(s): AMMONIA in the last 168 hours. CBC:  Recent Labs Lab 01/19/17 1153  WBC 11.3*  NEUTROABS 10.4*  HGB 11.0*  HCT 34.6*  MCV 91.5  PLT 236   Cardiac Enzymes:  Recent Labs Lab 01/19/17 1153  TROPONINI <0.03    BNP (last 3 results)  Recent Labs  11/29/16 0106  BNP 132.0*    ProBNP (last 3 results) No results for input(s): PROBNP in the last 8760 hours.  CBG:  Recent Labs  Lab 01/19/17 1143 01/19/17 1200 01/19/17 1302 01/19/17 1406  GLUCAP 27* 97 88 75    Radiological Exams on Admission: Dg Chest Portable 1 View  Result Date: 01/19/2017 CLINICAL DATA:  Pt presents to ED via [facility] with c/o hypoglycemia. Per EMS, EMS was dispatched to patient's house this morning and CBG was 26, pt received 1amp D50 at home then refused transport and ate breakfast, EMS also states they were called back out, CBG was 46, 12.5 grams D50 given en route to hospital, last CBG obtained by EMS 119. On patient arrival to ER CBG 27, MD notified, received VORB for 1amp D50. Hx of Afib, CVA, diabetes, HTN, mitral valve prolapse. EXAM: PORTABLE CHEST 1 VIEW COMPARISON:  11/29/2016 FINDINGS: Cardiac silhouette is normal in size. No mediastinal or hilar masses. Small to moderate hiatal hernia. No evidence of adenopathy. Lungs are mildly hyperexpanded. There are increased markings in the bases, left greater than right. This may reflect combination scarring atelectasis. Pneumonia should be considered in the proper clinical setting. Stable granuloma in the left upper lobe. No pleural effusion.  No pneumothorax. Skeletal structures are demineralized. Old healed rib fractures on the left. IMPRESSION: 1. No convincing  acute cardiopulmonary disease. 2. There is left greater than right lung base opacity has likely scarring, atelectasis or a combination, similar to the prior study. Consider pneumonia if there are consistent clinical symptoms. Electronically Signed   By: Lajean Manes M.D.   On: 01/19/2017 12:20    EKG: Independently reviewed.  Assessment/Plan Principal Problem:   Hypoglycemia Active Problems:   Peripheral nerve disease ([place])   Type 2 diabetes mellitus ([place])   Depression   Will admit to floor with D5NS@125  and follow sugars. Begin Lyrica and Cymbalta. Consult Psych. Repeat labs in AM.  Diet: carb modified Fluids: D5NS@125  DVT Prophylaxis: Eliquis  Code Status: FULl  Family Communication: yes  Disposition Plan: home  Time spent: 50 min

## 2017-01-19 NOTE — ED Notes (Signed)
MD notified that patient's wife is no longer at bedside, per MD pt now needs a 1:1 sitter. Tammy, EDT-P to bedside at this time to sit.

## 2017-01-19 NOTE — ED Notes (Signed)
[place] machine set up in front of patient at this time. NAD noted. Pt's wife remains at bedside at this time. Will continue to monitor for further patient needs.

## 2017-01-19 NOTE — ED Notes (Signed)
Pt given a lunch box per his request.

## 2017-01-20 LAB — GLUCOSE, CAPILLARY
GLUCOSE-CAPILLARY: 181 mg/dL — AB (ref 65–99)
GLUCOSE-CAPILLARY: 150 mg/dL — AB (ref 65–99)
GLUCOSE-CAPILLARY: 162 mg/dL — AB (ref 65–99)
GLUCOSE-CAPILLARY: 114 mg/dL — AB (ref 65–99)
Glucose-Capillary: 121 mg/dL — ABNORMAL HIGH (ref 65–99)
Glucose-Capillary: 120 mg/dL — ABNORMAL HIGH (ref 65–99)
Glucose-Capillary: 126 mg/dL — ABNORMAL HIGH (ref 65–99)
Glucose-Capillary: 140 mg/dL — ABNORMAL HIGH (ref 65–99)
Glucose-Capillary: 128 mg/dL — ABNORMAL HIGH (ref 65–99)

## 2017-01-20 LAB — CBC
HEMATOCRIT: 32 % — AB (ref 40.0–52.0)
Hemoglobin: 10.7 g/dL — ABNORMAL LOW (ref 13.0–18.0)
MCH: 30 pg (ref 26.0–34.0)
MCHC: 33.6 g/dL (ref 32.0–36.0)
MCV: 89.4 fL (ref 80.0–100.0)
Platelets: 241 10*3/uL (ref 150–440)
RBC: 3.58 MIL/uL — ABNORMAL LOW (ref 4.40–5.90)
RDW: 15.4 % — ABNORMAL HIGH (ref 11.5–14.5)
WBC: 7.1 10*3/uL (ref 3.8–10.6)

## 2017-01-20 LAB — COMPREHENSIVE METABOLIC PANEL
ALBUMIN: 2.5 g/dL — AB (ref 3.5–5.0)
ALT: 9 U/L — ABNORMAL LOW (ref 17–63)
AST: 18 U/L (ref 15–41)
Alkaline Phosphatase: 51 U/L (ref 38–126)
Anion gap: 5 (ref 5–15)
BUN: 31 mg/dL — ABNORMAL HIGH (ref 6–20)
CHLORIDE: 106 mmol/L (ref 101–111)
CO2: 27 mmol/L (ref 22–32)
Calcium: 7.7 mg/dL — ABNORMAL LOW (ref 8.9–10.3)
Creatinine, Ser: 1.79 mg/dL — ABNORMAL HIGH (ref 0.61–1.24)
GFR calc Af Amer: 38 mL/min — ABNORMAL LOW (ref >60)
GFR, EST NON AFRICAN AMERICAN: 33 mL/min — AB (ref >60)
GLUCOSE: 114 mg/dL — AB (ref 65–99)
POTASSIUM: 3.9 mmol/L (ref 3.5–5.1)
SODIUM: 138 mmol/L (ref 135–145)
Total Bilirubin: 0.3 mg/dL (ref 0.3–1.2)
Total Protein: 4.9 g/dL — ABNORMAL LOW (ref 6.5–8.1)

## 2017-01-20 MED ORDER — ONDANSETRON HCL 4 MG PO TABS: 4.0000 mg | ORAL_TABLET | Freq: Four times a day (QID) | ORAL | Status: DC | PRN

## 2017-01-20 MED ORDER — MIRTAZAPINE 15 MG PO TABS
15.0000 mg | ORAL_TABLET | Freq: Every day | ORAL | Status: DC
  Administered 2017-01-20 (×2): 15 mg via ORAL
  Filled 2017-01-20 (×2): qty 1

## 2017-01-20 MED ORDER — SODIUM CHLORIDE 0.9% FLUSH
3.0000 mL | Freq: Two times a day (BID) | INTRAVENOUS | Status: DC
  Administered 2017-01-20 – 2017-01-24 (×9): 3 mL via INTRAVENOUS

## 2017-01-20 MED ORDER — PRIMIDONE 50 MG PO TABS
50.0000 mg | ORAL_TABLET | Freq: Two times a day (BID) | ORAL | Status: DC
  Administered 2017-01-20 – 2017-01-24 (×10): 50 mg via ORAL
  Filled 2017-01-20 (×11): qty 1

## 2017-01-20 MED ORDER — TAMSULOSIN HCL 0.4 MG PO CAPS
0.4000 mg | ORAL_CAPSULE | Freq: Every day | ORAL | Status: DC
  Administered 2017-01-20 – 2017-01-24 (×5): 0.4 mg via ORAL
  Filled 2017-01-20 (×5): qty 1

## 2017-01-20 MED ORDER — PANTOPRAZOLE SODIUM 40 MG PO TBEC
40.0000 mg | DELAYED_RELEASE_TABLET | Freq: Every day | ORAL | Status: DC
  Administered 2017-01-20 – 2017-01-24 (×5): 40 mg via ORAL
  Filled 2017-01-20 (×5): qty 1

## 2017-01-20 MED ORDER — ACETAMINOPHEN 650 MG RE SUPP: 650.0000 mg | Freq: Four times a day (QID) | RECTAL | Status: DC | PRN

## 2017-01-20 MED ORDER — ACETAMINOPHEN 325 MG PO TABS: 650.0000 mg | ORAL_TABLET | Freq: Four times a day (QID) | ORAL | Status: DC | PRN

## 2017-01-20 MED ORDER — ALLOPURINOL 100 MG PO TABS
100.0000 mg | ORAL_TABLET | Freq: Every day | ORAL | Status: DC
  Administered 2017-01-20 – 2017-01-24 (×5): 100 mg via ORAL
  Filled 2017-01-20 (×5): qty 1

## 2017-01-20 MED ORDER — DOCUSATE SODIUM 100 MG PO CAPS
100.0000 mg | ORAL_CAPSULE | Freq: Two times a day (BID) | ORAL | Status: DC
  Administered 2017-01-20 – 2017-01-24 (×10): 100 mg via ORAL
  Filled 2017-01-20 (×10): qty 1

## 2017-01-20 MED ORDER — ONDANSETRON HCL 4 MG/2ML IJ SOLN: 4.0000 mg | Freq: Four times a day (QID) | INTRAMUSCULAR | Status: DC | PRN

## 2017-01-20 MED ORDER — AMLODIPINE BESYLATE 5 MG PO TABS
2.5000 mg | ORAL_TABLET | Freq: Every day | ORAL | Status: DC
  Administered 2017-01-20 – 2017-01-22 (×3): 2.5 mg via ORAL
  Filled 2017-01-20 (×3): qty 1

## 2017-01-20 MED ORDER — APIXABAN 2.5 MG PO TABS
2.5000 mg | ORAL_TABLET | Freq: Two times a day (BID) | ORAL | Status: DC
  Administered 2017-01-20 – 2017-01-24 (×10): 2.5 mg via ORAL
  Filled 2017-01-20 (×10): qty 1

## 2017-01-20 MED ORDER — BISACODYL 10 MG RE SUPP: 10.0000 mg | Freq: Every day | RECTAL | Status: DC | PRN

## 2017-01-20 NOTE — Progress Notes (Signed)
Sitter notified me and patient also said that he was going to lie to the doctors when they come see him so he can go home.

## 2017-01-20 NOTE — Progress Notes (Signed)
Sitter Stacy notified me that patient was discussing that he felt like he wanted to kill himself and blow his brains out. Patient wife had been visiting at the bedside and they were talking about dogs. Patient still is under suicide precautions. Will continue to monitor.

## 2017-01-20 NOTE — Progress Notes (Signed)
Midnight CBG= 137 mg/dL, CBG was not recorded due to equipment malfunction.

## 2017-01-20 NOTE — Clinical Social Work Note (Signed)
CSW received consult for IVC. CSW advised the RN that this service is via psych consult. CSW signing off, but please consult if any needs arise.  Santiago Bumpers, MSW, Latanya Presser [phone]

## 2017-01-20 NOTE — Progress Notes (Signed)
Patient is admitted to room 238 with the diagnosis of hypoglycemia. Alert and oriented x 4. No acute respiratory distress noted. Safety sitter at bedside. Tele box called to CCMD with Estill Bamberg as a second Psychologist, counselling. Skin assessment done with Gelene Mink. RN, noted bruises on bilateral upper extremities and pressure wound on left heal. 3 + pitting edema on bilateral legs. Patient cooperative and voiced no concern.  Will continue to monitor.

## 2017-01-20 NOTE — Progress Notes (Signed)
Safety sitter remained at bedside and pt. is resting with his eyes closed, respirations even and unlabored. Will continue to monitor.

## 2017-01-20 NOTE — Progress Notes (Signed)
[place] at [place] NAME: Dean Garcia    MR#:  [id number]  DATE OF BIRTH:  04/16/1928  SUBJECTIVE:  CHIEF COMPLAINT:   Chief Complaint  Patient presents with  . Hypoglycemia    Patient is here because of hypoglycemia secondary to intentional overdose of insulin with suicidal ideation.  Initially in 2 on dextrose IV drip now blood sugar is staying stable so we are able to cut down the IV rate and he is tolerating oral diet. Though he appears somewhat confused or demented and he is not able to give me a clear history that while he is here.  REVIEW OF SYSTEMS:  CONSTITUTIONAL: No fever, fatigue or weakness.  EYES: No blurred or double vision.  EARS, NOSE, AND THROAT: No tinnitus or ear pain.  RESPIRATORY: No cough, shortness of breath, wheezing or hemoptysis.  CARDIOVASCULAR: No chest pain, orthopnea, edema.  GASTROINTESTINAL: No nausea, vomiting, diarrhea or abdominal pain.  GENITOURINARY: No dysuria, hematuria.  ENDOCRINE: No polyuria, nocturia,  HEMATOLOGY: No anemia, easy bruising or bleeding SKIN: No rash or lesion. MUSCULOSKELETAL: No joint pain or arthritis.   NEUROLOGIC: No tingling, numbness, weakness.  PSYCHIATRY: No anxiety or depression.   ROS  DRUG ALLERGIES:   Allergies  Allergen Reactions  . Demerol [Meperidine] Nausea And Vomiting and Nausea Only    Other reaction(s): Nausea And Vomiting, Vomiting Nausea/vomiting    VITALS:  Blood pressure (!) 118/40, pulse (!) 53, temperature 98.3 F (36.8 C), temperature source Oral, resp. rate 16, height 6\' 1"  (1.854 m), weight 73.2 kg (161 lb 4.8 oz), SpO2 90 %.  PHYSICAL EXAMINATION:  GENERAL:  83 y.o.-year-old patient lying in the bed with no acute distress.  EYES: Pupils equal, round, reactive to light and accommodation. No scleral icterus. Extraocular muscles intact.  HEENT: Head atraumatic, normocephalic. Oropharynx and nasopharynx clear.  NECK:  Supple, no jugular  venous distention. No thyroid enlargement, no tenderness.  LUNGS: Normal breath sounds bilaterally, no wheezing, rales,rhonchi or crepitation. No use of accessory muscles of respiration.  CARDIOVASCULAR: S1, S2 normal. No murmurs, rubs, or gallops.  ABDOMEN: Soft, nontender, nondistended. Bowel sounds present. No organomegaly or mass.  EXTREMITIES: No pedal edema, cyanosis, or clubbing.  NEUROLOGIC: Cranial nerves II through XII are intact. Muscle strength 4/5 in all extremities. Sensation intact. Gait not checked.  PSYCHIATRIC: The patient is alert and oriented x 2.  SKIN: No obvious rash, lesion, or ulcer.   Physical Exam LABORATORY PANEL:   CBC  Recent Labs Lab 01/20/17 0420  WBC 7.1  HGB 10.7*  HCT 32.0*  PLT 241   ------------------------------------------------------------------------------------------------------------------  Chemistries   Recent Labs Lab 01/20/17 0420  NA 138  K 3.9  CL 106  CO2 27  GLUCOSE 114*  BUN 31*  CREATININE 1.79*  CALCIUM 7.7*  AST 18  ALT 9*  ALKPHOS 51  BILITOT 0.3   ------------------------------------------------------------------------------------------------------------------  Cardiac Enzymes  Recent Labs Lab 01/19/17 1153  TROPONINI <0.03   ------------------------------------------------------------------------------------------------------------------  RADIOLOGY:  Dg Chest Portable 1 View  Result Date: 01/19/2017 CLINICAL DATA:  Pt presents to ED via [facility] with c/o hypoglycemia. Per EMS, EMS was dispatched to patient's house this morning and CBG was 26, pt received 1amp D50 at home then refused transport and ate breakfast, EMS also states they were called back out, CBG was 46, 12.5 grams D50 given en route to hospital, last CBG obtained by EMS 119. On patient arrival to ER CBG 27, MD notified, received VORB  for 1amp D50. Hx of Afib, CVA, diabetes, HTN, mitral valve prolapse. EXAM: PORTABLE CHEST 1 VIEW COMPARISON:   11/29/2016 FINDINGS: Cardiac silhouette is normal in size. No mediastinal or hilar masses. Small to moderate hiatal hernia. No evidence of adenopathy. Lungs are mildly hyperexpanded. There are increased markings in the bases, left greater than right. This may reflect combination scarring atelectasis. Pneumonia should be considered in the proper clinical setting. Stable granuloma in the left upper lobe. No pleural effusion.  No pneumothorax. Skeletal structures are demineralized. Old healed rib fractures on the left. IMPRESSION: 1. No convincing acute cardiopulmonary disease. 2. There is left greater than right lung base opacity has likely scarring, atelectasis or a combination, similar to the prior study. Consider pneumonia if there are consistent clinical symptoms. Electronically Signed   By: Lajean Manes M.D.   On: 01/19/2017 12:20    ASSESSMENT AND PLAN:   Principal Problem:   Hypoglycemia Active Problems:   Peripheral nerve disease ([facility])   Type 2 diabetes mellitus ([facility])   Depression   Hypoglycemia due to insulin  * Hypoglycemia due to intentional overdose of insulin secondary to suicidal tendency   Currently on involuntary commitment, operated psychiatric consult.   IV fluid dextrose was given initially at 125 ML per hour and as blood sugar was stable, decreased to 50 mL per hour today earlier and now will discontinue and monitor him overnight.   He is tolerating his diet.  * Hypertension   Continue amlodipine  * Atrial fibrillation   Continue amiodarone, Apixaban  * Depression   Continue Remeron and Cymbalta   Psychiatric consult is awaited.     All the records are reviewed and case discussed with Care Management/Social Workerr. Management plans discussed with the patient, family and they are in agreement.  CODE STATUS: Full  TOTAL TIME TAKING CARE OF THIS PATIENT: 35 minutes.     POSSIBLE D/C IN 1-2 DAYS, DEPENDING ON CLINICAL CONDITION.   Vaughan Basta M.D  on 01/20/2017   Between 7am to 6pm - Pager - [phone]  After 6pm go to [email or web address] - password [email or web address] [facility]  Office  [phone]  CC: Primary care physician; Kirk Ruths., MD  Note: This dictation was prepared with Dragon dictation along with smaller phrase technology. Any transcriptional errors that result from this process are unintentional.

## 2017-01-21 DIAGNOSIS — F321 Major depressive disorder, single episode, moderate: Secondary | ICD-10-CM

## 2017-01-21 LAB — GLUCOSE, CAPILLARY
GLUCOSE-CAPILLARY: 104 mg/dL — AB (ref 65–99)
GLUCOSE-CAPILLARY: 136 mg/dL — AB (ref 65–99)
GLUCOSE-CAPILLARY: 137 mg/dL — AB (ref 65–99)
Glucose-Capillary: 110 mg/dL — ABNORMAL HIGH (ref 65–99)
Glucose-Capillary: 111 mg/dL — ABNORMAL HIGH (ref 65–99)
Glucose-Capillary: 118 mg/dL — ABNORMAL HIGH (ref 65–99)
Glucose-Capillary: 132 mg/dL — ABNORMAL HIGH (ref 65–99)

## 2017-01-21 MED ORDER — TORSEMIDE 20 MG PO TABS
20.0000 mg | ORAL_TABLET | Freq: Two times a day (BID) | ORAL | Status: DC
  Administered 2017-01-21 – 2017-01-24 (×6): 20 mg via ORAL
  Filled 2017-01-21 (×6): qty 1

## 2017-01-21 MED ORDER — MUPIROCIN CALCIUM 2 % EX CREA
TOPICAL_CREAM | Freq: Every day | CUTANEOUS | Status: DC
  Administered 2017-01-21 – 2017-01-24 (×4): via TOPICAL
  Filled 2017-01-21: qty 15

## 2017-01-21 MED ORDER — TRAZODONE HCL 50 MG PO TABS: 50.0000 mg | ORAL_TABLET | Freq: Every evening | ORAL | Status: DC | PRN

## 2017-01-21 MED ORDER — MIRTAZAPINE 15 MG PO TABS
30.0000 mg | ORAL_TABLET | Freq: Every day | ORAL | Status: DC
  Administered 2017-01-21 – 2017-01-23 (×3): 30 mg via ORAL
  Filled 2017-01-21 (×3): qty 2

## 2017-01-21 NOTE — Progress Notes (Signed)
Patient presently resting in the bed, remains alert and oriented, denies any pain, denies any suicidal ideation, family at bedside, 1:1 sitter at bedside mood calm and pleasant will  Continue to monitor

## 2017-01-21 NOTE — Progress Notes (Signed)
[place] at [place] NAME: Dean Garcia    MR#:  [id number]  DATE OF BIRTH:  05/06/1928  SUBJECTIVE:  CHIEF COMPLAINT:   Chief Complaint  Patient presents with  . Hypoglycemia    Patient is here because of hypoglycemia secondary to intentional overdose of insulin with suicidal ideation.  Initially in 2 on dextrose IV drip now blood sugar is staying stable so we are able to cut down the IV rate and he is tolerating oral diet. Though he appears somewhat confused or demented and he is not able to give me a clear history that while he is here. His wife was present in the room today and she was able to tell me that patient is suffering from recurrent leg cramps for last 1-2 years and that was the reason he tried to calm himself with high-dose of insulin.  REVIEW OF SYSTEMS:  CONSTITUTIONAL: No fever, fatigue or weakness.  EYES: No blurred or double vision.  EARS, NOSE, AND THROAT: No tinnitus or ear pain.  RESPIRATORY: No cough, shortness of breath, wheezing or hemoptysis.  CARDIOVASCULAR: No chest pain, orthopnea, edema.  GASTROINTESTINAL: No nausea, vomiting, diarrhea or abdominal pain.  GENITOURINARY: No dysuria, hematuria.  ENDOCRINE: No polyuria, nocturia,  HEMATOLOGY: No anemia, easy bruising or bleeding SKIN: No rash or lesion. MUSCULOSKELETAL: No joint pain or arthritis.   NEUROLOGIC: No tingling, numbness, weakness.  PSYCHIATRY: No anxiety or depression.   ROS  DRUG ALLERGIES:   Allergies  Allergen Reactions  . Demerol [Meperidine] Nausea And Vomiting and Nausea Only    Other reaction(s): Nausea And Vomiting, Vomiting Nausea/vomiting    VITALS:  Blood pressure (!) 146/41, pulse (!) 52, temperature 98.4 F (36.9 C), temperature source Oral, resp. rate 18, height 6\' 1"  (1.854 m), weight 74.5 kg (164 lb 3.2 oz), SpO2 91 %.  PHYSICAL EXAMINATION:  GENERAL:  83 y.o.-year-old patient lying in the bed with no acute distress.  EYES:  Pupils equal, round, reactive to light and accommodation. No scleral icterus. Extraocular muscles intact.  HEENT: Head atraumatic, normocephalic. Oropharynx and nasopharynx clear.  NECK:  Supple, no jugular venous distention. No thyroid enlargement, no tenderness.  LUNGS: Normal breath sounds bilaterally, no wheezing, rales,rhonchi or crepitation. No use of accessory muscles of respiration.  CARDIOVASCULAR: S1, S2 normal. No murmurs, rubs, or gallops.  ABDOMEN: Soft, nontender, nondistended. Bowel sounds present. No organomegaly or mass.  EXTREMITIES: No pedal edema, cyanosis, or clubbing.  NEUROLOGIC: Cranial nerves II through XII are intact. Muscle strength 4/5 in all extremities. Sensation intact. Gait not checked.  PSYCHIATRIC: The patient is alert and oriented x 2.  SKIN: No obvious rash, lesion, or ulcer.   Physical Exam LABORATORY PANEL:   CBC  Recent Labs Lab 01/20/17 0420  WBC 7.1  HGB 10.7*  HCT 32.0*  PLT 241   ------------------------------------------------------------------------------------------------------------------  Chemistries   Recent Labs Lab 01/20/17 0420  NA 138  K 3.9  CL 106  CO2 27  GLUCOSE 114*  BUN 31*  CREATININE 1.79*  CALCIUM 7.7*  AST 18  ALT 9*  ALKPHOS 51  BILITOT 0.3   ------------------------------------------------------------------------------------------------------------------  Cardiac Enzymes  Recent Labs Lab 01/19/17 1153  TROPONINI <0.03   ------------------------------------------------------------------------------------------------------------------  RADIOLOGY:  No results found.  ASSESSMENT AND PLAN:   Principal Problem:   Hypoglycemia Active Problems:   Peripheral nerve disease ([facility])   Type 2 diabetes mellitus ([facility])   Depression   Hypoglycemia due to insulin  * Hypoglycemia due  to intentional overdose of insulin secondary to suicidal tendency   Currently on involuntary commitment, operated  psychiatric consult.   IV fluid dextrose was given initially at 125 ML per hour and as blood sugar was stable, decreased to 50 mL per hour  earlier and then discontinue and monitor him overnight.   He is tolerating his diet.   Blood sugar stayed stable for last 24 hours without supplemental IV fluids were requiring insulin.  * Hypertension   Continue amlodipine  * Atrial fibrillation   Continue amiodarone, Apixaban  * Depression   Continue Remeron and Cymbalta   Psychiatric consult is awaited.  * Leg cramps   As per wife for last 1-2 years his primary care physician has tried multiple different medications and also send him to vascular studies but no clear reason was found.   We will start him on Lyrica, and give him SCDs to help with that.   Medically stable, still awaited evaluation by psychiatric team.  All the records are reviewed and case discussed with Care Management/Social Workerr. Management plans discussed with the patient, family and they are in agreement.  CODE STATUS: Full  TOTAL TIME TAKING CARE OF THIS PATIENT: 35 minutes.     POSSIBLE D/C IN 1-2 DAYS, DEPENDING ON CLINICAL CONDITION.   Vaughan Basta M.D on 01/21/2017   Between 7am to 6pm - Pager - [phone]  After 6pm go to [email or web address] - password [email or web address] [facility]  Office  [phone]  CC: Primary care physician; Kirk Ruths., MD  Note: This dictation was prepared with Dragon dictation along with smaller phrase technology. Any transcriptional errors that result from this process are unintentional.

## 2017-01-21 NOTE — Progress Notes (Signed)
Safety sitter present at  arms length in patient's room the whole  of this shift.. No respiratory distress noted. Pt. Has being resting quietly most part of the shift  with his eyes closed, respirations even and unlabored. Will continue to monitor.

## 2017-01-21 NOTE — Consult Note (Signed)
[place] Nurse wound consult note Reason for Consult: Nonhealing full thickness injury to left heel.  Present on admission.  Is asleep throughout assessment and wife and sitter at bedside.  Wife states he puts a bandage on this sporadically.  Wound type:pressure from shoe/neuropathy Pressure Injury POA: Yes Measurement:1 cm nonintact center with 1 cm calloused tissue present circumferentially.   Wound [id number] pink nongranulating Drainage (amount, consistency, odor) scant serosanguious  No odor.  Periwound:callous present Dressing procedure/placement/frequency:Cleanse wound to left heel with NS and pat gently dry.  Apply pea sized amount of Bactroban to wound center.  Cover with 4x4 gauze and kerlix/tape (for padding).  Change daily.  Will not follow at this time.  Please re-consult if needed.  Domenic Moras RN BSN [place] Pager [phone]

## 2017-01-21 NOTE — Consult Note (Signed)
[facility] Face-to-Face Psychiatry Consult   Reason for Consult:  Consult for 83 year old man who came to the hospital after attempting suicide by overdose on insulin Referring Physician:  Anselm Jungling Patient Identification: Dean Garcia MRN:  [id number] Principal Diagnosis: Moderate major depression, single episode [facility]) Diagnosis:   Patient Active Problem List   Diagnosis Date Noted  . Hypoglycemia [E16.2] 01/19/2017  . Depression [F32.9] 01/19/2017  . Hypoglycemia due to insulin [E16.0, T38.3X5A] 01/19/2017  . Muscle weakness (generalized) [M62.81]   . SOB (shortness of breath) [R06.02]   . Palliative care by specialist [Z51.5]   . HCAP (healthcare-associated pneumonia) [J18.9] 11/29/2016  . Pressure injury of skin [L89.90] 11/20/2016  . Palliative care encounter [Z51.5]   . Goals of care, counseling/discussion [Z71.89]   . Acute renal failure superimposed on chronic kidney disease ([place]) [N17.9, N18.9]   . ARF (acute renal failure) ([place]) [N17.9] 11/19/2016  . Moderate major depression, single episode ([place]) [F32.1] 10/22/2016  . Mild dementia [F03.90] 10/22/2016  . Paroxysmal atrial fibrillation ([place]) [I48.0] 02/21/2016  . A-fib ([place]) [I48.91] 02/12/2016  . Centrilobular emphysema ([place]) [J43.2] 12/27/2015  . B12 deficiency [E53.8] 04/12/2015  . Atherosclerosis of aorta ([place]) [I70.0] 10/23/2014  . 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 Has a tremor [R25.1] 07/17/2014  . Type 2 diabetes mellitus ([place]) [E11.9] 07/17/2014  . Edema [R60.9] 07/17/2014  . Billowing mitral valve [I34.1] 07/12/2014  . BP (high blood pressure) [I10] 07/12/2014  . Carotid artery narrowing [I65.29] 07/12/2014  . Impaired renal function [N28.9] 06/07/2014  . 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 Malignant neoplasm of prostate ([place]) [C61] 06/07/2014  . 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 Arthritis, degenerative [M19.90] 06/07/2014  . Disorder of peripheral nervous system ([place]) [G64] 06/07/2014   . Gout [M10.9] 06/07/2014  . 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 DD (diverticular disease) [K57.90] 06/07/2014  . Disease of rectum [K62.9] 06/07/2014  . Diabetes mellitus ([place]) [E11.9] 06/07/2014  . Malignant neoplasm of urinary bladder ([facility]) [C67.9] 06/07/2014    Total Time spent with patient: 1 hour  Subjective:   Dean Garcia is a 83 y.o. male patient admitted with "I tried to kill myself".  HPI:  Patient interviewed. Chart reviewed. Patient's wife interviewed as well. 47 year old man tried to kill himself by overdose of insulin the other night. He said he did this because he was having severe leg cramps. The main stress he complains of is that his leg cramps which have been going on for years have gotten much worse in the last couple months. They happen much of the day and are extremely painful and have kept him from sleeping well at night. Patient admits that his mood has continued to be somewhat depressed at times. Energy level and motivation of been low. He denies however that he had had regular suicidal thoughts and says that this attempt was a fairly spontaneous thing. He denies having any hallucinations or psychosis. He says he had been taking the mirtazapine that we started back in November. Patient now denies any wish to kill himself or plan to try to harm himself.  Social history: Patient and his wife live together. Several adult children but none of them in the immediate area. Sounds like their life which used to be very active has gotten quite constricted in recent years.  Medical history: Diabetes with chronic foot and leg pain. Now having leg cramps that are causing him severe pain much of the time.  Substance abuse history: Patient used to drink  a fair bit at my suggestion he says that he cut back on his alcohol in November and claims to no longer be drinking any alcohol at all.  Past Psychiatric History: I saw this patient in November in  the emergency room and at that time thought that he was depressed and had started him on a low dose of mirtazapine. No history of psychiatric hospitalization and prior to this no history of suicide attempts  Risk to Self: Is patient at risk for suicide?:  (Pt. is not expressing any suicide ideation right now) Risk to Others:   Prior Inpatient Therapy:   Prior Outpatient Therapy:    Past Medical History:  Past Medical History:  Diagnosis Date  . Atrial fibrillation ([place])   . Bladder cancer ([place])   . Bundle branch block, right   . Carotid stenosis   . Colon polyp   . CVA (cerebral infarction)   . Diabetes ([place])   . Gout   . High cholesterol   . Hypertension   . Mitral valve prolapse   . Prostate cancer ([place])   . Tremor     Past Surgical History:  Procedure Laterality Date  . APPENDECTOMY    . CAROTID ARTERY ANGIOPLASTY    . CHOLECYSTECTOMY     Family History:  Family History  Problem Relation Age of Onset  . Hypertension Father   . Stroke Father   . Diabetes Mother   . Prostate cancer Neg Hx   . Kidney cancer Neg Hx   . Kidney disease Neg Hx    Family Psychiatric  History: None known Social History:  History  Alcohol Use  . Yes    Comment: 1 beer a day     History  Drug Use No    Social History   Social History  . Marital status: Married    Spouse name: N/A  . Number of children: N/A  . Years of education: N/A   Social History Main Topics  . Smoking status: Former Smoker    Types: Pipe    Quit date: 10/29/2010  . Smokeless tobacco: Never Used  . Alcohol use Yes     Comment: 1 beer a day  . Drug use: No  . Sexual activity: Not Asked   Other Topics Concern  . None   Social History Narrative  . None   Additional Social History:    Allergies:   Allergies  Allergen Reactions  . Demerol [Meperidine] Nausea And Vomiting and Nausea Only    Other reaction(s): Nausea And Vomiting, Vomiting Nausea/vomiting    Labs:  Results for orders placed or  performed during the hospital encounter of 01/19/17 (from the past 48 hour(s))  Glucose, capillary     Status: None   Collection Time: 01/19/17  7:45 PM  Result Value Ref Range   Glucose-Capillary 92 65 - 99 mg/dL  Glucose, capillary     Status: Abnormal   Collection Time: 01/19/17  9:05 PM  Result Value Ref Range   Glucose-Capillary 103 (H) 65 - 99 mg/dL  Glucose, capillary     Status: Abnormal   Collection Time: 01/19/17 10:02 PM  Result Value Ref Range   Glucose-Capillary 158 (H) 65 - 99 mg/dL  Glucose, capillary     Status: Abnormal   Collection Time: 01/20/17 12:12 AM  Result Value Ref Range   Glucose-Capillary 137 (H) 65 - 99 mg/dL   Comment 1 Notify RN    Comment 2 Document in Chart   Glucose,  capillary     Status: Abnormal   Collection Time: 01/20/17  2:59 AM  Result Value Ref Range   Glucose-Capillary 121 (H) 65 - 99 mg/dL   Comment 1 Notify RN    Comment 2 Document in Chart   Comprehensive metabolic panel     Status: Abnormal   Collection Time: 01/20/17  4:20 AM  Result Value Ref Range   Sodium 138 135 - 145 mmol/L   Potassium 3.9 3.5 - 5.1 mmol/L   Chloride 106 101 - 111 mmol/L   CO2 27 22 - 32 mmol/L   Glucose, Bld 114 (H) 65 - 99 mg/dL   BUN 31 (H) 6 - 20 mg/dL   Creatinine, Ser 1.79 (H) 0.61 - 1.24 mg/dL   Calcium 7.7 (L) 8.9 - 10.3 mg/dL   Total Protein 4.9 (L) 6.5 - 8.1 g/dL   Albumin 2.5 (L) 3.5 - 5.0 g/dL   AST 18 15 - 41 U/L   ALT 9 (L) 17 - 63 U/L   Alkaline Phosphatase 51 38 - 126 U/L   Total Bilirubin 0.3 0.3 - 1.2 mg/dL   GFR calc non Af Amer 33 (L) >60 mL/min   GFR calc Af Amer 38 (L) >60 mL/min    Comment: (NOTE) The eGFR has been calculated using the CKD EPI equation. This calculation has not been validated in all clinical situations. eGFR's persistently <60 mL/min signify possible Chronic Kidney Disease.    Anion gap 5 5 - 15  CBC     Status: Abnormal   Collection Time: 01/20/17  4:20 AM  Result Value Ref Range   WBC 7.1 3.8 - 10.6 K/uL    RBC 3.58 (L) 4.40 - 5.90 MIL/uL   Hemoglobin 10.7 (L) 13.0 - 18.0 g/dL   HCT 32.0 (L) 40.0 - 52.0 %   MCV 89.4 80.0 - 100.0 fL   MCH 30.0 26.0 - 34.0 pg   MCHC 33.6 32.0 - 36.0 g/dL   RDW 15.4 (H) 11.5 - 14.5 %   Platelets 241 150 - 440 K/uL  Glucose, capillary     Status: Abnormal   Collection Time: 01/20/17  4:26 AM  Result Value Ref Range   Glucose-Capillary 120 (H) 65 - 99 mg/dL  Glucose, capillary     Status: Abnormal   Collection Time: 01/20/17  6:49 AM  Result Value Ref Range   Glucose-Capillary 126 (H) 65 - 99 mg/dL   Comment 1 Notify RN    Comment 2 Document in Chart   Glucose, capillary     Status: Abnormal   Collection Time: 01/20/17  8:57 AM  Result Value Ref Range   Glucose-Capillary 140 (H) 65 - 99 mg/dL  Glucose, capillary     Status: Abnormal   Collection Time: 01/20/17 10:31 AM  Result Value Ref Range   Glucose-Capillary 181 (H) 65 - 99 mg/dL  Glucose, capillary     Status: Abnormal   Collection Time: 01/20/17 12:35 PM  Result Value Ref Range   Glucose-Capillary 150 (H) 65 - 99 mg/dL  Glucose, capillary     Status: Abnormal   Collection Time: 01/20/17  2:23 PM  Result Value Ref Range   Glucose-Capillary 162 (H) 65 - 99 mg/dL  Glucose, capillary     Status: Abnormal   Collection Time: 01/20/17  4:46 PM  Result Value Ref Range   Glucose-Capillary 128 (H) 65 - 99 mg/dL  Glucose, capillary     Status: Abnormal   Collection Time: 01/20/17  7:50 PM  Result Value Ref Range   Glucose-Capillary 114 (H) 65 - 99 mg/dL   Comment 1 Notify RN    Comment 2 Document in Chart   Glucose, capillary     Status: Abnormal   Collection Time: 01/21/17 12:06 AM  Result Value Ref Range   Glucose-Capillary 132 (H) 65 - 99 mg/dL  Glucose, capillary     Status: Abnormal   Collection Time: 01/21/17  3:44 AM  Result Value Ref Range   Glucose-Capillary 110 (H) 65 - 99 mg/dL  Glucose, capillary     Status: Abnormal   Collection Time: 01/21/17  8:51 AM  Result Value Ref Range    Glucose-Capillary 104 (H) 65 - 99 mg/dL  Glucose, capillary     Status: Abnormal   Collection Time: 01/21/17 12:22 PM  Result Value Ref Range   Glucose-Capillary 136 (H) 65 - 99 mg/dL  Glucose, capillary     Status: Abnormal   Collection Time: 01/21/17  5:00 PM  Result Value Ref Range   Glucose-Capillary 111 (H) 65 - 99 mg/dL    Current Facility-Administered Medications  Medication Dose Route Frequency Provider Last Rate Last Dose  . acetaminophen (TYLENOL) tablet 650 mg  650 mg Oral Q6H PRN Idelle Crouch, MD       Or  . acetaminophen (TYLENOL) suppository 650 mg  650 mg Rectal Q6H PRN Idelle Crouch, MD      . allopurinol (ZYLOPRIM) tablet 100 mg  100 mg Oral Daily Idelle Crouch, MD   100 mg at 01/21/17 0909  . amLODipine (NORVASC) tablet 2.5 mg  2.5 mg Oral Daily Idelle Crouch, MD   2.5 mg at 01/21/17 0909  . apixaban (ELIQUIS) tablet 2.5 mg  2.5 mg Oral BID Idelle Crouch, MD   2.5 mg at 01/21/17 0909  . bisacodyl (DULCOLAX) suppository 10 mg  10 mg Rectal Daily PRN Idelle Crouch, MD      . docusate sodium (COLACE) capsule 100 mg  100 mg Oral BID Idelle Crouch, MD   100 mg at 01/21/17 0909  . DULoxetine (CYMBALTA) DR capsule 30 mg  30 mg Oral Daily Idelle Crouch, MD   30 mg at 01/21/17 0909  . HYDROcodone-acetaminophen (NORCO) 10-325 MG per tablet 1 tablet  1 tablet Oral Q4H PRN Idelle Crouch, MD   1 tablet at 01/20/17 1336  . insulin aspart (novoLOG) injection 0-9 Units  0-9 Units Subcutaneous TID WC Idelle Crouch, MD   1 Units at 01/21/17 1227  . mirtazapine (REMERON) tablet 30 mg  30 mg Oral QHS Gonzella Lex, MD      . mupirocin cream (BACTROBAN) 2 %   Topical Daily Vaughan Basta, MD      . ondansetron (ZOFRAN) tablet 4 mg  4 mg Oral Q6H PRN Idelle Crouch, MD       Or  . ondansetron [facility]) injection 4 mg  4 mg Intravenous Q6H PRN Idelle Crouch, MD      . pantoprazole (PROTONIX) EC tablet 40 mg  40 mg Oral QAC breakfast Idelle Crouch, MD   40 mg at 01/21/17 2620  . pregabalin (LYRICA) capsule 50 mg  50 mg Oral BID Idelle Crouch, MD   50 mg at 01/21/17 0908  . primidone (MYSOLINE) tablet 50 mg  50 mg Oral BID Idelle Crouch, MD   50 mg at 01/21/17 0908  . sodium chloride flush (NS) 0.9 % injection 3 mL  3 mL Intravenous Q12H Idelle Crouch, MD   3 mL at 01/21/17 0910  . tamsulosin (FLOMAX) capsule 0.4 mg  0.4 mg Oral Daily Idelle Crouch, MD   0.4 mg at 01/21/17 0909  . torsemide (DEMADEX) tablet 20 mg  20 mg Oral BID Vaughan Basta, MD   20 mg at 01/21/17 1700  . traZODone (DESYREL) tablet 50 mg  50 mg Oral QHS PRN Vaughan Basta, MD        Musculoskeletal: Strength & Muscle Tone: decreased Gait & Station: unable to stand Patient leans: N/A  Psychiatric Specialty Exam: Physical Exam  Nursing note and vitals reviewed. Constitutional: He appears well-developed and well-nourished.  HENT:  Head: Normocephalic and atraumatic.  Eyes: Conjunctivae are normal. Pupils are equal, round, and reactive to light.  Neck: Normal range of motion.  Cardiovascular: Normal heart sounds.   Respiratory: Effort normal.  GI: Soft.  Musculoskeletal: Normal range of motion. He exhibits edema.  Neurological: He is alert.  Skin: Skin is warm and dry.  Psychiatric: His affect is blunt. His speech is delayed. He is slowed. Thought content is not paranoid. Cognition and memory are normal. He expresses impulsivity. He expresses no homicidal and no suicidal ideation.    Review of Systems  Constitutional: Negative.   HENT: Negative.   Eyes: Negative.   Respiratory: Negative.   Cardiovascular: Negative.   Gastrointestinal: Negative.   Musculoskeletal: Positive for myalgias.  Skin: Negative.   Neurological: Negative.   Psychiatric/Behavioral: Positive for depression. Negative for hallucinations, memory loss, substance abuse and suicidal ideas. The patient is nervous/anxious and has insomnia.     Blood pressure  (!) 146/41, pulse (!) 52, temperature 98.4 F (36.9 C), temperature source Oral, resp. rate 18, height 6' 1"  (1.854 m), weight 74.5 kg (164 lb 3.2 oz), SpO2 91 %.Body mass index is 21.66 kg/m.  General Appearance: Casual  Eye Contact:  Fair  Speech:  Slow  Volume:  Decreased  Mood:  Dysphoric  Affect:  Congruent  Thought Process:  Goal Directed  Orientation:  Full (Time, Place, and Person)  Thought Content:  Tangential  Suicidal Thoughts:  No  Homicidal Thoughts:  No  Memory:  Immediate;   Good Recent;   Poor Remote;   Fair  Judgement:  Impaired  Insight:  Shallow  Psychomotor Activity:  Decreased  Concentration:  Concentration: Fair  Recall:  [employer] of Knowledge:  Fair  Language:  Fair  Akathisia:  No  Handed:  Right  AIMS (if indicated):     Assets:  Communication Skills Desire for Improvement Housing Social Support  ADL's:  Impaired  Cognition:  Impaired,  Mild  Sleep:        Treatment Plan Summary: Daily contact with patient to assess and evaluate symptoms and progress in treatment, Medication management and Plan 83 year old man who attempted to kill himself by overdosing on insulin. He notified his wife as soon as he did it and was cooperative with coming into the hospital. Nevertheless both the patient and his wife seem to have taken the whole event rather lightly despite the patient being very clear that he wanted to kill himself at the time. Patient says he is feeling better now and denies any suicidal ideation. He does say that his main issue is trying to get his pain under good control. I am undecided as to whether we should be referring him to a geriatric psychiatry unit. I am not going to discontinue the IVC yet and I am  not going to cancel the sitter. Patient does have risk factors for suicide however I'm not sure how beneficial it would be to go through the whole process of referring him to [facility] or similar facility. I think going to increase his Remeron  to 30 mg at night. Possibly if a better plan is in place for controlling his pain it may be safe for him to go home. Wife is concerned because he seems a little bit more confused today which could be related to some of the medicines that were added. I will follow-up tomorrow.  Disposition: Supportive therapy provided about ongoing stressors. Discussed crisis plan, support from social network, calling 911, coming to the Emergency Department, and calling Suicide Hotline.  Alethia Berthold, MD 01/21/2017 7:21 PM

## 2017-01-22 LAB — URINALYSIS, COMPLETE (UACMP) WITH MICROSCOPIC
BILIRUBIN URINE: NEGATIVE
Bacteria, UA: NONE SEEN
GLUCOSE, UA: NEGATIVE mg/dL
HGB URINE DIPSTICK: NEGATIVE
Ketones, ur: NEGATIVE mg/dL
LEUKOCYTES UA: NEGATIVE
NITRITE: NEGATIVE
Protein, ur: NEGATIVE mg/dL
SPECIFIC GRAVITY, URINE: 1.011 (ref 1.005–1.030)
SQUAMOUS EPITHELIAL / LPF: NONE SEEN
pH: 6 (ref 5.0–8.0)

## 2017-01-22 LAB — GLUCOSE, CAPILLARY
GLUCOSE-CAPILLARY: 103 mg/dL — AB (ref 65–99)
GLUCOSE-CAPILLARY: 127 mg/dL — AB (ref 65–99)
Glucose-Capillary: 97 mg/dL (ref 65–99)

## 2017-01-22 LAB — MAGNESIUM: Magnesium: 2.1 mg/dL (ref 1.7–2.4)

## 2017-01-22 MED ORDER — AMLODIPINE BESYLATE 5 MG PO TABS
5.0000 mg | ORAL_TABLET | Freq: Every day | ORAL | Status: DC
  Administered 2017-01-23 – 2017-01-24 (×2): 5 mg via ORAL
  Filled 2017-01-22 (×2): qty 1

## 2017-01-22 MED ORDER — POTASSIUM CHLORIDE CRYS ER 10 MEQ PO TBCR
10.0000 meq | EXTENDED_RELEASE_TABLET | Freq: Every day | ORAL | Status: DC
  Administered 2017-01-22 – 2017-01-24 (×3): 10 meq via ORAL
  Filled 2017-01-22 (×3): qty 1

## 2017-01-22 MED ORDER — AMLODIPINE BESYLATE 5 MG PO TABS
2.5000 mg | ORAL_TABLET | Freq: Once | ORAL | Status: AC
  Administered 2017-01-22: 2.5 mg via ORAL
  Filled 2017-01-22: qty 1

## 2017-01-22 NOTE — Consult Note (Signed)
Dean Garcia   Reason for Garcia:  Garcia for 83 year old man who came to the hospital after attempting suicide by overdose on insulin Referring Physician:  Anselm Jungling Patient Identification: Dean Garcia:  [id number] Principal Diagnosis: Moderate major depression, single episode [facility]) Diagnosis:   Patient Active Problem List   Diagnosis Date Noted  . Hypoglycemia [E16.2] 01/19/2017  . Depression [F32.9] 01/19/2017  . Hypoglycemia due to insulin [E16.0, T38.3X5A] 01/19/2017  . Muscle weakness (generalized) [M62.81]   . SOB (shortness of breath) [R06.02]   . Palliative care by specialist [Z51.5]   . HCAP (healthcare-associated pneumonia) [J18.9] 11/29/2016  . Pressure injury of skin [L89.90] 11/20/2016  . Palliative care encounter [Z51.5]   . Goals of care, counseling/discussion [Z71.89]   . Acute renal failure superimposed on chronic kidney disease ([place]) [N17.9, N18.9]   . ARF (acute renal failure) ([place]) [N17.9] 11/19/2016  . Moderate major depression, single episode ([place]) [F32.1] 10/22/2016  . Mild dementia [F03.90] 10/22/2016  . Paroxysmal atrial fibrillation ([place]) [I48.0] 02/21/2016  . A-fib ([place]) [I48.91] 02/12/2016  . Centrilobular emphysema ([place]) [J43.2] 12/27/2015  . B12 deficiency [E53.8] 04/12/2015  . Atherosclerosis of aorta ([place]) [I70.0] 10/23/2014  . 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 Has a tremor [R25.1] 07/17/2014  . Type 2 diabetes mellitus ([place]) [E11.9] 07/17/2014  . Edema [R60.9] 07/17/2014  . Billowing mitral valve [I34.1] 07/12/2014  . BP (high blood pressure) [I10] 07/12/2014  . Carotid artery narrowing [I65.29] 07/12/2014  . Impaired renal function [N28.9] 06/07/2014  . 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 Malignant neoplasm of prostate ([place]) [C61] 06/07/2014  . 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 Arthritis, degenerative [M19.90] 06/07/2014  . Disorder of peripheral nervous system ([place]) [G64] 06/07/2014   . Gout [M10.9] 06/07/2014  . 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 DD (diverticular disease) [K57.90] 06/07/2014  . Disease of rectum [K62.9] 06/07/2014  . Diabetes mellitus ([place]) [E11.9] 06/07/2014  . Malignant neoplasm of urinary bladder ([facility]) [C67.9] 06/07/2014    Total Time spent with patient: 20 minutes  Subjective:   Yidel Dean Garcia is a 83 y.o. male patient admitted with "I tried to kill myself".  Follow-up for Tuesday the 20th. Patient seen. Chart reviewed. Spoke with the patient and with his wife by telephone. Patient is feeling much better. He says the pain is a lot better than it was yesterday. He totally denies any suicidal thoughts at all. Says that he feels more confident and safe going home if he has some pain control. Wife is concerned that she thinks that the Lyrica made the patient more "spacey" and she is worried about that. I also noticed that he seemed to be a little more cognitively slow yesterday in the Lyrica could be a cause of that.  HPI:  Patient interviewed. Chart reviewed. Patient's wife interviewed as well. 15 year old man tried to kill himself by overdose of insulin the other night. He said he did this because he was having severe leg cramps. The main stress he complains of is that his leg cramps which have been going on for years have gotten much worse in the last couple months. They happen much of the day and are extremely painful and have kept him from sleeping well at night. Patient admits that his mood has continued to be somewhat depressed at times. Energy level and motivation of been low. He denies however that he had had regular suicidal thoughts and says that this attempt was a fairly spontaneous thing. He  denies having any hallucinations or psychosis. He says he had been taking the mirtazapine that we started back in November. Patient now denies any wish to kill himself or plan to try to harm himself.  Social history: Patient  and his wife live together. Several adult children but none of them in the immediate area. Sounds like their life which used to be very active has gotten quite constricted in recent years.  Medical history: Diabetes with chronic foot and leg pain. Now having leg cramps that are causing him severe pain much of the time.  Substance abuse history: Patient used to drink a fair bit at my suggestion he says that he cut back on his alcohol in November and claims to no longer be drinking any alcohol at all.  Past Psychiatric History: I saw this patient in November in the emergency room and at that time thought that he was depressed and had started him on a low dose of mirtazapine. No history of psychiatric hospitalization and prior to this no history of suicide attempts  Risk to Self: Is patient at risk for suicide?:  (Pt. is not expressing any suicide ideation right now) Risk to Others:   Prior Inpatient Therapy:   Prior Outpatient Therapy:    Past Medical History:  Past Medical History:  Diagnosis Date  . Atrial fibrillation ([place])   . Bladder cancer ([place])   . Bundle branch block, right   . Carotid stenosis   . Colon polyp   . CVA (cerebral infarction)   . Diabetes ([place])   . Gout   . High cholesterol   . Hypertension   . Mitral valve prolapse   . Prostate cancer ([place])   . Tremor     Past Surgical History:  Procedure Laterality Date  . APPENDECTOMY    . CAROTID ARTERY ANGIOPLASTY    . CHOLECYSTECTOMY     Family History:  Family History  Problem Relation Age of Onset  . Hypertension Father   . Stroke Father   . Diabetes Mother   . Prostate cancer Neg Hx   . Kidney cancer Neg Hx   . Kidney disease Neg Hx    Family Psychiatric  History: None known Social History:  History  Alcohol Use  . Yes    Comment: 1 beer a day     History  Drug Use No    Social History   Social History  . Marital status: Married    Spouse name: N/A  . Number of children: N/A  . Years of  education: N/A   Social History Main Topics  . Smoking status: Former Smoker    Types: Pipe    Quit date: 10/29/2010  . Smokeless tobacco: Never Used  . Alcohol use Yes     Comment: 1 beer a day  . Drug use: No  . Sexual activity: Not Asked   Other Topics Concern  . None   Social History Narrative  . None   Additional Social History:    Allergies:   Allergies  Allergen Reactions  . Demerol [Meperidine] Nausea And Vomiting and Nausea Only    Other reaction(s): Nausea And Vomiting, Vomiting Nausea/vomiting    Labs:  Results for orders placed or performed during the hospital encounter of 01/19/17 (from the past 48 hour(s))  Glucose, capillary     Status: Abnormal   Collection Time: 01/21/17 12:06 AM  Result Value Ref Range   Glucose-Capillary 132 (H) 65 - 99 mg/dL  Glucose, capillary  Status: Abnormal   Collection Time: 01/21/17  3:44 AM  Result Value Ref Range   Glucose-Capillary 110 (H) 65 - 99 mg/dL  Glucose, capillary     Status: Abnormal   Collection Time: 01/21/17  8:51 AM  Result Value Ref Range   Glucose-Capillary 104 (H) 65 - 99 mg/dL  Glucose, capillary     Status: Abnormal   Collection Time: 01/21/17 12:22 PM  Result Value Ref Range   Glucose-Capillary 136 (H) 65 - 99 mg/dL  Glucose, capillary     Status: Abnormal   Collection Time: 01/21/17  5:00 PM  Result Value Ref Range   Glucose-Capillary 111 (H) 65 - 99 mg/dL  Glucose, capillary     Status: Abnormal   Collection Time: 01/21/17  8:45 PM  Result Value Ref Range   Glucose-Capillary 118 (H) 65 - 99 mg/dL  Magnesium     Status: None   Collection Time: 01/22/17  5:39 AM  Result Value Ref Range   Magnesium 2.1 1.7 - 2.4 mg/dL  Glucose, capillary     Status: Abnormal   Collection Time: 01/22/17  8:10 AM  Result Value Ref Range   Glucose-Capillary 103 (H) 65 - 99 mg/dL  Urinalysis, Complete w Microscopic     Status: Abnormal   Collection Time: 01/22/17 11:28 AM  Result Value Ref Range   Color,  Urine YELLOW (A) YELLOW   APPearance CLEAR (A) CLEAR   Specific Gravity, Urine 1.011 1.005 - 1.030   pH 6.0 5.0 - 8.0   Glucose, UA NEGATIVE NEGATIVE mg/dL   Hgb urine dipstick NEGATIVE NEGATIVE   Bilirubin Urine NEGATIVE NEGATIVE   Ketones, ur NEGATIVE NEGATIVE mg/dL   Protein, ur NEGATIVE NEGATIVE mg/dL   Nitrite NEGATIVE NEGATIVE   Leukocytes, UA NEGATIVE NEGATIVE   RBC / HPF 0-5 0 - 5 RBC/hpf   WBC, UA 0-5 0 - 5 WBC/hpf   Bacteria, UA NONE SEEN NONE SEEN   Squamous Epithelial / LPF NONE SEEN NONE SEEN   Mucous PRESENT    Hyaline Casts, UA PRESENT   Glucose, capillary     Status: Abnormal   Collection Time: 01/22/17 12:15 PM  Result Value Ref Range   Glucose-Capillary 127 (H) 65 - 99 mg/dL  Glucose, capillary     Status: None   Collection Time: 01/22/17  5:13 PM  Result Value Ref Range   Glucose-Capillary 97 65 - 99 mg/dL    Current Facility-Administered Medications  Medication Dose Route Frequency Provider Last Rate Last Dose  . acetaminophen (TYLENOL) tablet 650 mg  650 mg Oral Q6H PRN Idelle Crouch, MD       Or  . acetaminophen (TYLENOL) suppository 650 mg  650 mg Rectal Q6H PRN Idelle Crouch, MD      . allopurinol (ZYLOPRIM) tablet 100 mg  100 mg Oral Daily Idelle Crouch, MD   100 mg at 01/22/17 [id number]  . [START ON 01/23/2017] amLODipine (NORVASC) tablet 5 mg  5 mg Oral Daily Vaughan Basta, MD      . apixaban (ELIQUIS) tablet 2.5 mg  2.5 mg Oral BID Idelle Crouch, MD   2.5 mg at 01/22/17 0926  . bisacodyl (DULCOLAX) suppository 10 mg  10 mg Rectal Daily PRN Idelle Crouch, MD      . docusate sodium (COLACE) capsule 100 mg  100 mg Oral BID Idelle Crouch, MD   100 mg at 01/22/17 [id number]  . DULoxetine (CYMBALTA) DR capsule 30 mg  30 mg Oral  Daily Idelle Crouch, MD   30 mg at 01/22/17 [id number]  . HYDROcodone-acetaminophen (NORCO) 10-325 MG per tablet 1 tablet  1 tablet Oral Q4H PRN Idelle Crouch, MD   1 tablet at 01/20/17 1336  . insulin aspart (novoLOG)  injection 0-9 Units  0-9 Units Subcutaneous TID WC Idelle Crouch, MD   1 Units at 01/21/17 1227  . mirtazapine (REMERON) tablet 30 mg  30 mg Oral QHS Gonzella Lex, MD   30 mg at 01/21/17 2236  . mupirocin cream (BACTROBAN) 2 %   Topical Daily Vaughan Basta, MD      . ondansetron [facility]) tablet 4 mg  4 mg Oral Q6H PRN Idelle Crouch, MD       Or  . ondansetron [facility]) injection 4 mg  4 mg Intravenous Q6H PRN Idelle Crouch, MD      . pantoprazole (PROTONIX) EC tablet 40 mg  40 mg Oral QAC breakfast Idelle Crouch, MD   40 mg at 01/22/17 [id number]  . potassium chloride (K-DUR,KLOR-CON) CR tablet 10 mEq  10 mEq Oral Daily Vaughan Basta, MD   10 mEq at 01/22/17 1729  . primidone (MYSOLINE) tablet 50 mg  50 mg Oral BID Idelle Crouch, MD   50 mg at 01/22/17 [id number]  . sodium chloride flush (NS) 0.9 % injection 3 mL  3 mL Intravenous Q12H Idelle Crouch, MD   3 mL at 01/22/17 0926  . tamsulosin (FLOMAX) capsule 0.4 mg  0.4 mg Oral Daily Idelle Crouch, MD   0.4 mg at 01/22/17 [id number]  . torsemide (DEMADEX) tablet 20 mg  20 mg Oral BID Vaughan Basta, MD   20 mg at 01/22/17 0925  . traZODone (DESYREL) tablet 50 mg  50 mg Oral QHS PRN Vaughan Basta, MD        Musculoskeletal: Strength & Muscle Tone: decreased Gait & Station: unable to stand Patient leans: N/A  Psychiatric Specialty Exam: Physical Exam  Nursing note and vitals reviewed. Constitutional: He appears well-developed and well-nourished.  HENT:  Head: Normocephalic and atraumatic.  Eyes: Conjunctivae are normal. Pupils are equal, round, and reactive to light.  Neck: Normal range of motion.  Cardiovascular: Normal heart sounds.   Respiratory: Effort normal.  GI: Soft.  Musculoskeletal: Normal range of motion. He exhibits no edema.  Neurological: He is alert.  Skin: Skin is warm and dry.  Psychiatric: His affect is not blunt. His speech is not delayed. He is slowed. Thought content is not  paranoid. Cognition and memory are normal. He does not express impulsivity. He expresses no homicidal and no suicidal ideation.    Review of Systems  Constitutional: Negative.   HENT: Negative.   Eyes: Negative.   Respiratory: Negative.   Cardiovascular: Negative.   Gastrointestinal: Negative.   Musculoskeletal: Positive for myalgias.  Skin: Negative.   Neurological: Negative.   Psychiatric/Behavioral: Negative for depression, hallucinations, memory loss, substance abuse and suicidal ideas. The patient is nervous/anxious. The patient does not have insomnia.     Blood pressure (!) 154/58, pulse (!) 58, temperature 98.1 F (36.7 C), temperature source Oral, resp. rate 18, height 6\' 1"  (1.854 m), weight 75.7 kg (166 lb 12.8 oz), SpO2 97 %.Body mass index is 22.01 kg/m.  General Appearance: Casual  Eye Contact:  Fair  Speech:  Normal Rate  Volume:  Decreased  Mood:  Euthymic  Affect:  Congruent  Thought Process:  Goal Directed  Orientation:  Full (Time, Place, and Person)  Thought Content:  Tangential  Suicidal Thoughts:  No  Homicidal Thoughts:  No  Memory:  Immediate;   Good Recent;   Poor Remote;   Fair  Judgement:  Impaired  Insight:  Shallow  Psychomotor Activity:  Decreased  Concentration:  Concentration: Fair  Recall:  [employer] of Knowledge:  Fair  Language:  Fair  Akathisia:  No  Handed:  Right  AIMS (if indicated):     Assets:  Communication Skills Desire for Improvement Housing Social Support  ADL's:  Impaired  Cognition:  Impaired,  Mild  Sleep:        Treatment Plan Summary: Daily contact with patient to assess and evaluate symptoms and progress in treatment, Medication management and Plan Patient appears to be much more calm and is consistently without any suicidal thought. On reflection and reevaluation I think he no longer needs the involuntary commitment and does not require a referral to inpatient psychiatric unit. I did increase his mirtazapine dose  and several other changes have been made to address his pain. Based on the wife's concern I am going to discontinue the Lyrica which I don't know if that would be particularly helpful for his muscle spasms anyway. Patient's wife is comfortable with him coming home in his current condition. I will follow-up in the hospital anyway. IVC discontinued.  Disposition: Supportive therapy provided about ongoing stressors. Discussed crisis plan, support from social network, calling 911, coming to the Emergency Department, and calling Suicide Hotline.  Alethia Berthold, MD 01/22/2017 8:19 PM

## 2017-01-22 NOTE — Discharge Instructions (Addendum)
Hypoglycemia  Hypoglycemia is when the sugar (glucose) level in the blood is too low. Symptoms of low blood sugar may include:  Feeling:  Hungry.  Worried or nervous (anxious).  Sweaty and clammy.  Confused.  Dizzy.  Sleepy.  Sick to your stomach (nauseous).  Having:  A fast heartbeat.  A headache.  A change in your vision.  Jerky movements that you cannot control (seizure).  Nightmares.  Tingling or no feeling (numbness) around the mouth, lips, or tongue.  Having trouble with:  Talking.  Paying attention (concentrating).  Moving (coordination).  Sleeping.  Shaking.  Passing out (fainting).  Getting upset easily (irritability). Low blood sugar can happen to people who have diabetes and people who do not have diabetes. Low blood sugar can happen quickly, and it can be an emergency. Treating Low Blood Sugar  Low blood sugar is often treated by eating or drinking something sugary right away. If you can think clearly and swallow safely, follow the 15:15 rule:  Take 15 grams of a fast-acting carb (carbohydrate). Some fast-acting carbs are:  1 tube of glucose gel.  3 sugar tablets (glucose pills).  6-8 pieces of hard candy.  4 oz (120 mL) of fruit juice.  4 oz (120 mL) of regular (not diet) soda.  Check your blood sugar 15 minutes after you take the carb.  If your blood sugar is still at or below 70 mg/dL (3.9 mmol/L), take 15 grams of a carb again.  If your blood sugar does not go above 70 mg/dL (3.9 mmol/L) after 3 tries, get help right away.  After your blood sugar goes back to normal, eat a meal or a snack within 1 hour. Treating Very Low Blood Sugar  If your blood sugar is at or below 54 mg/dL (3 mmol/L), you have very low blood sugar (severe hypoglycemia). This is an emergency. Do not wait to see if the symptoms will go away. Get medical help right away. Call your local emergency services (911 in the [place] Do not drive yourself to the  hospital. If you have very low blood sugar and you cannot eat or drink, you may need a glucagon shot (injection). A family member or friend should learn how to check your blood sugar and how to give you a glucagon shot. Ask your doctor if you need to have a glucagon shot kit at home. Follow these instructions at home: General instructions  Avoid any diets that cause you to not eat enough food. Talk with your doctor before you start any new diet.  Take over-the-counter and prescription medicines only as told by your doctor.  Limit alcohol to no more than 1 drink per day for nonpregnant women and 2 drinks per day for men. One drink equals 12 oz of beer, 5 oz of wine, or 1 oz of hard liquor.  Keep all follow-up visits as told by your doctor. This is important. If You Have Diabetes:   Make sure you know the symptoms of low blood sugar.  Always keep a source of sugar with you, such as:  Sugar.  Sugar tablets.  Glucose gel.  Fruit juice.  Regular soda (not diet soda).  Milk.  Hard candy.  Honey.  Take your medicines as told.  Follow your exercise and meal plan.  Eat on time. Do not skip meals.  Follow your sick day plan when you cannot eat or drink normally. Make this plan ahead of time with your doctor.  Check your blood sugar  as often as told by your doctor. Always check before and after exercise.  Share your diabetes care plan with:  Your work or school.  People you live with.  Check your pee (urine) for ketones:  When you are sick.  As told by your doctor.  Carry a card or wear jewelry that says you have diabetes. If You Have Low Blood Sugar From Other Causes:   Check your blood sugar as often as told by your doctor.  Follow instructions from your doctor about what you cannot eat or drink. Contact a doctor if:  You have trouble keeping your blood sugar in your target range.  You have low blood sugar often. Get help right away if:  You still have  symptoms after you eat or drink something sugary.  Your blood sugar is at or below 54 mg/dL (3 mmol/L).  You have jerky movements that you cannot control.  You pass out. These symptoms may be an emergency. Do not wait to see if the symptoms will go away. Get medical help right away. Call your local emergency services (911 in the [place] Do not drive yourself to the hospital.  This information is not intended to replace advice given to you by your health care provider. Make sure you discuss any questions you have with your health care provider. Document Released: 02/13/2010 Document Revised: 04/26/2016 Document Reviewed: 12/23/2015 Elsevier Interactive Patient Education  2017 [place].   Hypoglycemia  Hypoglycemia is when the sugar (glucose) level in the blood is too low. Symptoms of low blood sugar may include:  Feeling:  Hungry.  Worried or nervous (anxious).  Sweaty and clammy.  Confused.  Dizzy.  Sleepy.  Sick to your stomach (nauseous).  Having:  A fast heartbeat.  A headache.  A change in your vision.  Jerky movements that you cannot control (seizure).  Nightmares.  Tingling or no feeling (numbness) around the mouth, lips, or tongue.  Having trouble with:  Talking.  Paying attention (concentrating).  Moving (coordination).  Sleeping.  Shaking.  Passing out (fainting).  Getting upset easily (irritability). Low blood sugar can happen to people who have diabetes and people who do not have diabetes. Low blood sugar can happen quickly, and it can be an emergency. Treating Low Blood Sugar  Low blood sugar is often treated by eating or drinking something sugary right away. If you can think clearly and swallow safely, follow the 15:15 rule:  Take 15 grams of a fast-acting carb (carbohydrate). Some fast-acting carbs are:  1 tube of glucose gel.  3 sugar tablets (glucose pills).  6-8 pieces of hard candy.  4 oz (120 mL) of fruit juice.  4 oz  (120 mL) of regular (not diet) soda.  Check your blood sugar 15 minutes after you take the carb.  If your blood sugar is still at or below 70 mg/dL (3.9 mmol/L), take 15 grams of a carb again.  If your blood sugar does not go above 70 mg/dL (3.9 mmol/L) after 3 tries, get help right away.  After your blood sugar goes back to normal, eat a meal or a snack within 1 hour. Treating Very Low Blood Sugar  If your blood sugar is at or below 54 mg/dL (3 mmol/L), you have very low blood sugar (severe hypoglycemia). This is an emergency. Do not wait to see if the symptoms will go away. Get medical help right away. Call your local emergency services (911 in the [place] Do not drive yourself to the  hospital. If you have very low blood sugar and you cannot eat or drink, you may need a glucagon shot (injection). A family member or friend should learn how to check your blood sugar and how to give you a glucagon shot. Ask your doctor if you need to have a glucagon shot kit at home. Follow these instructions at home: General instructions  Avoid any diets that cause you to not eat enough food. Talk with your doctor before you start any new diet.  Take over-the-counter and prescription medicines only as told by your doctor.  Limit alcohol to no more than 1 drink per day for nonpregnant women and 2 drinks per day for men. One drink equals 12 oz of beer, 5 oz of wine, or 1 oz of hard liquor.  Keep all follow-up visits as told by your doctor. This is important. If You Have Diabetes:   Make sure you know the symptoms of low blood sugar.  Always keep a source of sugar with you, such as:  Sugar.  Sugar tablets.  Glucose gel.  Fruit juice.  Regular soda (not diet soda).  Milk.  Hard candy.  Honey.  Take your medicines as told.  Follow your exercise and meal plan.  Eat on time. Do not skip meals.  Follow your sick day plan when you cannot eat or drink normally. Make this plan ahead of time  with your doctor.  Check your blood sugar as often as told by your doctor. Always check before and after exercise.  Share your diabetes care plan with:  Your work or school.  People you live with.  Check your pee (urine) for ketones:  When you are sick.  As told by your doctor.  Carry a card or wear jewelry that says you have diabetes. If You Have Low Blood Sugar From Other Causes:   Check your blood sugar as often as told by your doctor.  Follow instructions from your doctor about what you cannot eat or drink. Contact a doctor if:  You have trouble keeping your blood sugar in your target range.  You have low blood sugar often. Get help right away if:  You still have symptoms after you eat or drink something sugary.  Your blood sugar is at or below 54 mg/dL (3 mmol/L).  You have jerky movements that you cannot control.  You pass out. These symptoms may be an emergency. Do not wait to see if the symptoms will go away. Get medical help right away. Call your local emergency services (911 in the [place] Do not drive yourself to the hospital.  This information is not intended to replace advice given to you by your health care provider. Make sure you discuss any questions you have with your health care provider. Document Released: 02/13/2010 Document Revised: 04/26/2016 Document Reviewed: 12/23/2015 Elsevier Interactive Patient Education  2017 Elsevier Inc.   Acute Kidney Injury, Adult Acute kidney injury (AKI) occurs when there is sudden (acute) damage to the kidneys.A small amount of kidney damage may not cause problems, but a large amount of damage may make it difficult or impossible for the kidneys to work the way they should. AKI may develop into long-lasting (chronic) kidney disease. Early detection and treatment of AKI may prevent kidney damage from becoming permanent or getting worse. What are the causes? Common causes of this condition include:  A problem with blood  flow to the kidneys. This may be caused by:  Blood loss.  Heart and blood vessel (  cardiovascular) disease.  Severe burns.  Liver disease.  Direct damage to the kidneys. This may be caused by:  Some medicines.  A kidney infection.  Poisoning.  Being around or in contact with poisonous (toxic) substances.  A surgical wound.  A hard, direct force to the kidney area.  A sudden blockage of urine flow. This may be caused by:  Cancer.  Kidney stones.  Enlarged prostate in males. What are the signs or symptoms? Symptoms develop slowly and may not be obvious until the kidney damage becomes severe. It is possible to have AKI for years without showing any symptoms. Symptoms of this condition can include:  Swelling (edema) of the face, legs, ankles, or feet.  Numbness, tingling, or loss of feeling (sensation) in the hands or feet.  Tiredness (lethargy).  Nausea or vomiting.  Confusion or trouble concentrating.  Problems with urination, such as:  Painful or burning feeling during urination.  Decreased urine production.  Frequent urination, especially at night.  Bloody urine.  Muscle twitches and cramps, especially in the legs.  Shortness of breath.  Weakness.  Constant itchiness.  Loss of appetite.  Metallic taste in the mouth.  Trouble sleeping.  Pale lining of the eyelids and surface of the eye (conjunctiva). How is this diagnosed? This condition may be diagnosed with various tests. Tests may include:  Blood tests.  Urine tests.  Imaging tests.  A test in which a sample of tissue is removed from the kidneys to be looked at under a microscope (kidney biopsy). How is this treated? Treatment of AKIvaries depending on the cause and severity of the kidney damage. In mild cases, treatment may not be needed. The kidneys may heal on their own. If AKI is more severe, your health care provider will treat the cause of the kidney damage, help the kidneys  heal, and prevent problems from occurring. Severe cases may require a procedure to remove toxic wastes from the body (dialysis) or surgery to repair kidney damage. Surgery may involve:  Repair of a torn kidney.  Removal of a urine flow obstruction. Follow these instructions at home:  Follow your prescribed diet.  Take over-the-counter and prescription medicines only as told by your health care provider.  Do not take any new medicines unless approved by your health care provider. Many medicines can worsen your kidney damage.  Do not take any vitamin and mineral supplements unless approved by your health care provider. Many nutritional supplements can worsen your kidney damage.  The dose of some medicines that you take may need to be adjusted.  Do not use any tobacco products, such as cigarettes, chewing tobacco, and e-cigarettes. If you need help quitting, ask your health care provider.  Keep all follow-up visits as told by your health care provider. This is important.  Keep track of your blood pressure. Report changes in your blood pressure as told by your health care provider.  Achieve and maintain a healthy weight. If you need help with this, ask your health care provider.  Start or continue an exercise plan. Try to exercise at least 30 minutes a day, 5 days a week.  Stay current with immunizations as told by your health care provider. Where to find more information:  American Association of Kidney Patients: BombTimer.gl  National Kidney Foundation: www.kidney.[place]: https://mathis.com/  [facility]: www.lifeoptions.org and www.kidneyschool.org Contact a health care provider if:  Your symptoms get worse.  You develop new symptoms. Get help right away if:  You develop symptoms of end-stage kidney disease, which include:  Headaches.  Abnormally dark or light skin.  Numbness in the hands or feet.  Easy bruising.  Frequent  hiccups.  Chest pain.  Shortness of breath.  End of menstruation in women.  You have a fever.  You have decreased urine production.  You have pain or bleeding when you urinate. This information is not intended to replace advice given to you by your health care provider. Make sure you discuss any questions you have with your health care provider. Document Released: 06/04/2011 Document Revised: 06/28/2016 Document Reviewed: 07/18/2012 Elsevier Interactive Patient Education  2017 [place] on my medicine - ELIQUIS (apixaban)  This medication education was reviewed with me or my healthcare representative as part of my discharge preparation.  The pharmacist that spoke with me during my hospital stay was:  Ramond Dial, [facility]  Why was Eliquis prescribed for you? Eliquis was prescribed for you to reduce the risk of a blood clot forming that can cause a stroke if you have a medical condition called atrial fibrillation (a type of irregular heartbeat).  What do You need to know about Eliquis ? Take your Eliquis TWICE DAILY - one tablet in the morning and one tablet in the evening with or without food. If you have difficulty swallowing the tablet whole please discuss with your pharmacist how to take the medication safely.  Take Eliquis exactly as prescribed by your doctor and DO NOT stop taking Eliquis without talking to the doctor who prescribed the medication.  Stopping may increase your risk of developing a stroke.  Refill your prescription before you run out.  After discharge, you should have regular check-up appointments with your healthcare provider that is prescribing your Eliquis.  In the future your dose may need to be changed if your kidney function or weight changes by a significant amount or as you get older.  What do you do if you miss a dose? If you miss a dose, take it as soon as you remember on the same day and resume taking twice daily.  Do not take  more than one dose of ELIQUIS at the same time to make up a missed dose.  Important Safety Information A possible side effect of Eliquis is bleeding. You should call your healthcare provider right away if you experience any of the following: ? Bleeding from an injury or your nose that does not stop. ? Unusual colored urine (red or dark brown) or unusual colored stools (red or black). ? Unusual bruising for unknown reasons. ? A serious fall or if you hit your head (even if there is no bleeding).  Some medicines may interact with Eliquis and might increase your risk of bleeding or clotting while on Eliquis. To help avoid this, consult your healthcare provider or pharmacist prior to using any new prescription or non-prescription medications, including herbals, vitamins, non-steroidal anti-inflammatory drugs (NSAIDs) and supplements.  This website has more information on Eliquis (apixaban): http://www.eliquis.com/eliquis/home

## 2017-01-22 NOTE — Plan of Care (Signed)
Problem: Metabolic: Goal: Ability to maintain appropriate glucose levels will improve Outcome: Progressing No episodes of hyopglycemia this shift.

## 2017-01-22 NOTE — Plan of Care (Signed)
Problem: Safety: Goal: Ability to remain free from injury will improve Outcome: Progressing Sitter at bedside. Intermittent agitation per sitter.  IV pulled out by patient per sitter.  Cooperative with new IV insertion. No suicide ideations voiced this shift.

## 2017-01-22 NOTE — Care Management Important Message (Signed)
Important Message  Patient Details  Name: Dean Garcia MRN: [id number] Date of Birth: 1927/12/21   Medicare Important Message Given:  Yes  Initial signed IM printed from [place] and given to patient/ wife.     Katrina Stack, RN 01/22/2017, 12:57 PM

## 2017-01-23 LAB — GLUCOSE, CAPILLARY
GLUCOSE-CAPILLARY: 78 mg/dL (ref 65–99)
GLUCOSE-CAPILLARY: 122 mg/dL — AB (ref 65–99)
Glucose-Capillary: 105 mg/dL — ABNORMAL HIGH (ref 65–99)
Glucose-Capillary: 114 mg/dL — ABNORMAL HIGH (ref 65–99)
Glucose-Capillary: 120 mg/dL — ABNORMAL HIGH (ref 65–99)

## 2017-01-23 NOTE — Progress Notes (Signed)
[place] [facility] [place] NAME: Dean Garcia    MR#:  [id number]  DATE OF BIRTH:  Jan 08, 1928  SUBJECTIVE:  CHIEF COMPLAINT:   Chief Complaint  Patient presents with  . Hypoglycemia    Patient is here because of hypoglycemia secondary to intentional overdose of insulin with suicidal ideation.   patient is suffering from recurrent leg cramps for last 1-2 years and that was the reason he tried to hurt himself with high-dose of insulin.   Still confused. Said SCD help. Pain is little better under control now.   REVIEW OF SYSTEMS:  CONSTITUTIONAL: No fever, fatigue or weakness.  EYES: No blurred or double vision.  EARS, NOSE, AND THROAT: No tinnitus or ear pain.  RESPIRATORY: No cough, shortness of breath, wheezing or hemoptysis.  CARDIOVASCULAR: No chest pain, orthopnea, edema.  GASTROINTESTINAL: No nausea, vomiting, diarrhea or abdominal pain.  GENITOURINARY: No dysuria, hematuria.  ENDOCRINE: No polyuria, nocturia,  HEMATOLOGY: No anemia, easy bruising or bleeding SKIN: No rash or lesion. MUSCULOSKELETAL: No joint pain or arthritis.   NEUROLOGIC: No tingling, numbness, weakness.  PSYCHIATRY: No anxiety or depression.   ROS  DRUG ALLERGIES:   Allergies  Allergen Reactions  . Demerol [Meperidine] Nausea And Vomiting and Nausea Only    Other reaction(s): Nausea And Vomiting, Vomiting Nausea/vomiting    VITALS:  Blood pressure (!) 167/68, pulse (!) 58, temperature 98.4 F (36.9 C), temperature source Oral, resp. rate 18, height 6\' 1"  (1.854 m), weight 69.7 kg (153 lb 9.6 oz), SpO2 94 %.  PHYSICAL EXAMINATION:  GENERAL:  83 y.o.-year-old patient lying in the bed with no acute distress.  EYES: Pupils equal, round, reactive to light and accommodation. No scleral icterus. Extraocular muscles intact.  HEENT: Head atraumatic, normocephalic. Oropharynx and nasopharynx clear.  NECK:  Supple, no jugular venous distention. No thyroid enlargement,  no tenderness.  LUNGS: Normal breath sounds bilaterally, no wheezing, rales,rhonchi or crepitation. No use of accessory muscles of respiration.  CARDIOVASCULAR: S1, S2 normal. No murmurs, rubs, or gallops.  ABDOMEN: Soft, nontender, nondistended. Bowel sounds present. No organomegaly or mass.  EXTREMITIES: No pedal edema, cyanosis, or clubbing.  NEUROLOGIC: Cranial nerves II through XII are intact. Muscle strength 4/5 in all extremities. Sensation intact. Gait not checked.  PSYCHIATRIC: The patient is alert and oriented x 1.  SKIN: No obvious rash, lesion, or ulcer.   Physical Exam LABORATORY PANEL:   CBC  Recent Labs Lab 01/20/17 0420  WBC 7.1  HGB 10.7*  HCT 32.0*  PLT 241   ------------------------------------------------------------------------------------------------------------------  Chemistries   Recent Labs Lab 01/20/17 0420 01/22/17 0539  NA 138  --   K 3.9  --   CL 106  --   CO2 27  --   GLUCOSE 114*  --   BUN 31*  --   CREATININE 1.79*  --   CALCIUM 7.7*  --   MG  --  2.1  AST 18  --   ALT 9*  --   ALKPHOS 51  --   BILITOT 0.3  --    ------------------------------------------------------------------------------------------------------------------  Cardiac Enzymes  Recent Labs Lab 01/19/17 1153  TROPONINI <0.03   ------------------------------------------------------------------------------------------------------------------  RADIOLOGY:  No results found.  ASSESSMENT AND PLAN:   Principal Problem:   Moderate major depression, single episode (HCC) Active Problems:   Peripheral nerve disease (HCC)   Type 2 diabetes mellitus ([place])   Hypoglycemia   Depression   Hypoglycemia due to insulin  * Hypoglycemia due  to intentional overdose of insulin secondary to suicidal tendency   Currently on involuntary commitment, operated psychiatric consult.   IV fluid dextrose was given initially at 125 ML per hour and as blood sugar was stable, decreased  to 50 mL per hour  earlier and then discontinue and monitor him overnight.   He is tolerating his diet.   Blood sugar stayed stable without supplemental IV fluids were requiring insulin.  * Hypertension   Continue amlodipine  * Atrial fibrillation   Continue amiodarone, Apixaban  * Depression   Continue Remeron and Cymbalta   Psychiatric consult is appreciated.  * Leg cramps   As per wife for last 1-2 years his primary care physician has tried multiple different medications and also send him to vascular studies but no clear reason was found.   give him SCDs to help with that.  Seems SCDs are helping.   On trazodone and psych meds.   Medically stable, still awaited evaluation by psychiatric team.  All the records are reviewed and case discussed with Care Management/Social Workerr. Management plans discussed with the patient, family and they are in agreement.  CODE STATUS: Full  TOTAL TIME TAKING CARE OF THIS PATIENT: 35 minutes.    POSSIBLE D/C IN 1-2 DAYS, DEPENDING ON CLINICAL CONDITION. May need rehab but it may be difficult with psych issues. I spoke to his wife on phone today.  Vaughan Basta M.D on 01/23/2017   Between 7am to 6pm - Pager - [phone]  After 6pm go to [email or web address] - password [email or web address] [facility]  Office  [phone]  CC: Primary care physician; Kirk Ruths., MD  Note: This dictation was prepared with Dragon dictation along with smaller phrase technology. Any transcriptional errors that result from this process are unintentional.

## 2017-01-23 NOTE — Progress Notes (Signed)
PASARR has been received [id number] E, expires 02/22/2017. [facility] admissions coordinator at [facility] is aware of above.   [employer], LCSW [phone]

## 2017-01-23 NOTE — Clinical Social Work Placement (Signed)
   CLINICAL SOCIAL WORK PLACEMENT  NOTE  Date:  01/23/2017  Patient Details  Name: Dean Garcia MRN: [id number] Date of Birth: 1928-11-24  Clinical Social Work is seeking post-discharge placement for this patient at the [place] level of care (*CSW will initial, date and re-position this form in  chart as items are completed):  Yes   Patient/family provided with [place] Work Department's list of facilities offering this level of care within the geographic area requested by the patient (or if unable, by the patient's family).  Yes   Patient/family informed of their freedom to choose among providers that offer the needed level of care, that participate in Medicare, Medicaid or managed care program needed by the patient, have an available bed and are willing to accept the patient.  Yes   Patient/family informed of [facility]'s ownership interest in [facility] and [facility], as well as of the fact that they are under no obligation to receive care at these facilities.  PASRR submitted to EDS on 01/23/17     PASRR number received on       Existing PASRR number confirmed on       FL2 transmitted to all facilities in geographic area requested by pt/family on 01/23/17     FL2 transmitted to all facilities within larger geographic area on       Patient informed that his/her managed care company has contracts with or will negotiate with certain facilities, including the following:        Yes   Patient/family informed of bed offers received.  Patient chooses bed at  [facility] )     Physician recommends and patient chooses bed at      Patient to be transferred to   on  .  Patient to be transferred to facility by       Patient family notified on   of transfer.  Name of family member notified:        PHYSICIAN       Additional Comment:    _______________________________________________ Shawnie Nicole, [name], LCSW 01/23/2017, 3:42 PM

## 2017-01-23 NOTE — Progress Notes (Signed)
Dean Garcia at [place] NAME: Dean Garcia    MR#:  [id number]  DATE OF BIRTH:  05-Jun-1928  SUBJECTIVE:  CHIEF COMPLAINT:   Chief Complaint  Patient presents with  . Hypoglycemia    Patient is here because of hypoglycemia secondary to intentional overdose of insulin with suicidal ideation.  Initially in 2 on dextrose IV drip now blood sugar is staying stable so we are able to cut down the IV rate and he is tolerating oral diet. Though he appears somewhat confused or demented and he is not able to give me a clear history that while he is here. His wife was present in the room today and she was able to tell me that patient is suffering from recurrent leg cramps for last 1-2 years and that was the reason he tried to calm himself with high-dose of insulin.   Still confused. Said SCD help.  REVIEW OF SYSTEMS:  CONSTITUTIONAL: No fever, fatigue or weakness.  EYES: No blurred or double vision.  EARS, NOSE, AND THROAT: No tinnitus or ear pain.  RESPIRATORY: No cough, shortness of breath, wheezing or hemoptysis.  CARDIOVASCULAR: No chest pain, orthopnea, edema.  GASTROINTESTINAL: No nausea, vomiting, diarrhea or abdominal pain.  GENITOURINARY: No dysuria, hematuria.  ENDOCRINE: No polyuria, nocturia,  HEMATOLOGY: No anemia, easy bruising or bleeding SKIN: No rash or lesion. MUSCULOSKELETAL: No joint pain or arthritis.   NEUROLOGIC: No tingling, numbness, weakness.  PSYCHIATRY: No anxiety or depression.   ROS  DRUG ALLERGIES:   Allergies  Allergen Reactions  . Demerol [Meperidine] Nausea And Vomiting and Nausea Only    Other reaction(s): Nausea And Vomiting, Vomiting Nausea/vomiting    VITALS:  Blood pressure (!) 167/68, pulse (!) 58, temperature 98.4 F (36.9 C), temperature source Oral, resp. rate 18, height 6\' 1"  (1.854 m), weight 69.7 kg (153 lb 9.6 oz), SpO2 94 %.  PHYSICAL EXAMINATION:  GENERAL:  83 y.o.-year-old patient lying in the  bed with no acute distress.  EYES: Pupils equal, round, reactive to light and accommodation. No scleral icterus. Extraocular muscles intact.  HEENT: Head atraumatic, normocephalic. Oropharynx and nasopharynx clear.  NECK:  Supple, no jugular venous distention. No thyroid enlargement, no tenderness.  LUNGS: Normal breath sounds bilaterally, no wheezing, rales,rhonchi or crepitation. No use of accessory muscles of respiration.  CARDIOVASCULAR: S1, S2 normal. No murmurs, rubs, or gallops.  ABDOMEN: Soft, nontender, nondistended. Bowel sounds present. No organomegaly or mass.  EXTREMITIES: No pedal edema, cyanosis, or clubbing.  NEUROLOGIC: Cranial nerves II through XII are intact. Muscle strength 4/5 in all extremities. Sensation intact. Gait not checked.  PSYCHIATRIC: The patient is alert and oriented x 1.  SKIN: No obvious rash, lesion, or ulcer.   Physical Exam LABORATORY PANEL:   CBC  Recent Labs Lab 01/20/17 0420  WBC 7.1  HGB 10.7*  HCT 32.0*  PLT 241   ------------------------------------------------------------------------------------------------------------------  Chemistries   Recent Labs Lab 01/20/17 0420 01/22/17 0539  NA 138  --   K 3.9  --   CL 106  --   CO2 27  --   GLUCOSE 114*  --   BUN 31*  --   CREATININE 1.79*  --   CALCIUM 7.7*  --   MG  --  2.1  AST 18  --   ALT 9*  --   ALKPHOS 51  --   BILITOT 0.3  --    ------------------------------------------------------------------------------------------------------------------  Cardiac Enzymes  Recent Labs Lab 01/19/17 1153  TROPONINI <0.03   ------------------------------------------------------------------------------------------------------------------  RADIOLOGY:  No results found.  ASSESSMENT AND PLAN:   Principal Problem:   Moderate major depression, single episode (HCC) Active Problems:   Peripheral nerve disease (HCC)   Type 2 diabetes mellitus ([place])   Hypoglycemia   Depression    Hypoglycemia due to insulin  * Hypoglycemia due to intentional overdose of insulin secondary to suicidal tendency   Currently on involuntary commitment, operated psychiatric consult.   IV fluid dextrose was given initially at 125 ML per hour and as blood sugar was stable, decreased to 50 mL per hour  earlier and then discontinue and monitor him overnight.   He is tolerating his diet.   Blood sugar stayed stable without supplemental IV fluids were requiring insulin.  * Hypertension   Continue amlodipine  * Atrial fibrillation   Continue amiodarone, Apixaban  * Depression   Continue Remeron and Cymbalta   Psychiatric consult is appreciated.  * Leg cramps   As per wife for last 1-2 years his primary care physician has tried multiple different medications and also send him to vascular studies but no clear reason was found.   We will start him on Lyrica, and give him SCDs to help with that.  Seems SCDs are helping.   Medically stable, still awaited evaluation by psychiatric team.  All the records are reviewed and case discussed with Care Management/Social Workerr. Management plans discussed with the patient, family and they are in agreement.  CODE STATUS: Full  TOTAL TIME TAKING CARE OF THIS PATIENT: 35 minutes.    POSSIBLE D/C IN 1-2 DAYS, DEPENDING ON CLINICAL CONDITION.   Dean Garcia M.D on 01/23/2017   Between 7am to 6pm - Pager - [phone]  After 6pm go to [email or web address] - password [email or web address] [facility]  Office  [phone]  CC: Primary care physician; Kirk Ruths., MD  Note: This dictation was prepared with Dragon dictation along with smaller phrase technology. Any transcriptional errors that result from this process are unintentional.

## 2017-01-23 NOTE — Clinical Social Work Note (Signed)
Clinical Social Work Assessment  Patient Details  Name: Dean Garcia MRN: [id number] Date of Birth: 20-Feb-1928  Date of referral:  01/23/17               Reason for consult:  Facility Placement                Permission sought to share information with:  Chartered certified accountant granted to share information::  Yes, Verbal Permission Granted  Name::      Retail buyer::   [place]   Relationship::     Contact Information:     Housing/Transportation Living arrangements for the past 2 months:  [place], Charity fundraiser of Information:  Patient, Spouse Patient Interpreter Needed:  None Criminal Activity/Legal Involvement Pertinent to Current Situation/Hospitalization:  No - Comment as needed Significant Relationships:  Spouse Lives with:  Spouse Do you feel safe going back to the place where you live?  Yes Need for family participation in patient care:  Yes (Comment)  Care giving concerns:  Patient lives with his wife Dean Garcia in independent living at [facility].    Social Worker assessment / plan:  Holiday representative (CSW) received verbal consult from PT that recommendation is SNF. Patient came into [facility] for "taking overdose of insulin as a suicide attempt." Psych has cleared patient and discontinued IVC. Per psych note patient "does not require a referral to inpatient psychiatric unit." CSW met with patient and his wife Dean Garcia was at bedside. CSW introduced self and explained role of CSW department. CSW explained that PT is recommending SNF. Per patient he just got out of SNF at [facility]. Per wife he has used 21 days at [facility]. CSW explained that patient will go back to [facility] on day 22 and medicare will cover at 80% now. Per wife patient has a supplement that will pick up the 20% that medicare does not cover. Patient and wife are agreeable for patient to return to [facility]. Per Seth Bake admissions  coordinator at [facility] patient can return to the SNF sections. FL2 complete, PASARR is pending. CSW will continue to follow and assist as needed.    Employment status:  Retired Forensic scientist:  Medicare PT Recommendations:  [place] / Referral to community resources:  [place]  Patient/Family's Response to care:  Patient and wife are agreeable for patient to go to [facility].   Patient/Family's Understanding of and Emotional Response to Diagnosis, Current Treatment, and Prognosis:  Patient and wife were pleasant and thanked CSW for assistance.   Emotional Assessment Appearance:  Appears stated age Attitude/Demeanor/Rapport:    Affect (typically observed):  Accepting, Adaptable, Pleasant Orientation:  Oriented to Self, Oriented to Place, Oriented to Situation, Fluctuating Orientation (Suspected and/or reported Sundowners) Alcohol / Substance use:  Not Applicable Psych involvement (Current and /or in the community):  Yes (Comment)  Discharge Needs  Concerns to be addressed:  Discharge Planning Concerns Readmission within the last 30 days:  No Current discharge risk:  Dependent with Mobility Barriers to Discharge:  Continued Medical Work up   [employer], [name], LCSW 01/23/2017, 3:43 PM

## 2017-01-23 NOTE — Evaluation (Signed)
Physical Therapy Evaluation Patient Details Name: Dean Garcia MRN: [id number] DOB: 17-May-1928 Today's Date: 01/23/2017   History of Present Illness  Pt is an 83 y.o. male presenting to hospital with hypoglycemia.  Pt admitted with overdose of insulin as suicide attempt d/t severe leg cramps.  PMH includes a-fib, DM, CVA, htn, bladder and prostate CA, severe neuropathy, mitral valve prolapse, tremor, L heel wound.  Clinical Impression  Prior to hospital admission, pt was modified independent with functional mobility ambulating with rollator and had assist 3x/week for 2 hours each time for showers and ex's.  Pt lives with his wife in [place] at [facility].  Currently pt requires increased assist with bed mobility, transfers, and ambulating 35 feet with RW (see below for details).  Pt's B knees flexing with distance requiring assist from therapists to maintain upright and for safety; pt also requiring vc's to stay closer to RW and to take longer step lengths.  Pt also demonstrating confusion during session (pt trying to button up pullover shirt that did not have any buttons).  Pt would benefit from skilled PT to address noted impairments and functional limitations.  Recommend pt discharge to STR when medically appropriate.    Follow Up Recommendations SNF    Equipment Recommendations  Rolling walker with 5" wheels    Recommendations for Other Services OT consult     Precautions / Restrictions Precautions Precautions: Fall Precaution Comments: L heel wound covered in dressing Restrictions Weight Bearing Restrictions: No      Mobility  Bed Mobility Overal bed mobility: Needs Assistance Bed Mobility: Supine to Sit;Sit to Supine     Supine to sit: Mod assist;HOB elevated Sit to supine: Min assist;Mod assist;+2 for physical assistance   General bed mobility comments: assist for trunk supine to sit; assist for trunk and LE's sit to supine and to boost up in bed (pt did not  follow directions well to scoot up in bed on own)  Transfers Overall transfer level: Needs assistance Equipment used: Rolling walker (2 wheeled) Transfers: Sit to/from [facility] Sit to Stand: Min assist;Mod assist Stand pivot transfers: Min assist;+2 physical assistance (stand step turn bed to/from bedside commode)       General transfer comment: 1st trial pt mod to max assist x1 to stand; 2nd trial min assist x2; 3rd trial mod assist to stand from bed (CGA of 2nd); 4th trial pt mod assist to stand from commode (CGA of 2nd); vc's required to scoot forward on edge of bed and for hand and feet placement  Ambulation/Gait Ambulation/Gait assistance: Min assist;Mod assist;+2 physical assistance Ambulation Distance (Feet): 35 Feet Assistive device: Rolling walker (2 wheeled)       General Gait Details: decreased B step length; decreased B foot clearance; decreased cadence; B knees with increasing flexion with distance and fatigue requiring increased assist from therapists for support; pt requiring vc's to stay closer to RW and for longer step lengths  Stairs            Wheelchair Mobility    Modified Rankin (Stroke Patients Only)       Balance Overall balance assessment: Needs assistance Sitting-balance support: Bilateral upper extremity supported;Feet supported Sitting balance-Leahy Scale: Fair Sitting balance - Comments: static sitting   Standing balance support: Bilateral upper extremity supported (on RW) Standing balance-Leahy Scale: Fair Standing balance comment: static standing  Pertinent Vitals/Pain Pain Assessment: Faces Pain Score: 3  (pt reporting minimal LE pain with activities (pt reports walking helps pain)) Pain Location: LE's Pain Descriptors / Indicators: Cramping Pain Intervention(s): Limited activity within patient's tolerance;Monitored during session;Repositioned    Home Living Family/patient  expects to be discharged to:: Private residence Living Arrangements: Spouse/significant other Available Help at Discharge: Family;Home health Type of Home: Independent living facility [facility]) Home Access: Stairs to enter Entrance Stairs-Rails: Left Entrance Stairs-Number of Steps: 2 Home Layout: One level Home Equipment: Walker - 4 wheels;Grab bars - toilet (transport chair)      Prior Function Level of Independence: Independent with assistive device(s)         Comments: (-) driving (spouse drives); uses rollator in home and community (has one in house and one in trunk of car); denies any falls in past 6 months; reports having home health aid (vs OT?) that helps with showers and ex's 3x/week for 2 hours each time; pt reports chronic LE painful spasms.     Hand Dominance        Extremity/Trunk Assessment   Upper Extremity Assessment Upper Extremity Assessment: Generalized weakness    Lower Extremity Assessment Lower Extremity Assessment: Generalized weakness       Communication   Communication: No difficulties  Cognition Arousal/Alertness: Awake/alert Behavior During Therapy: WFL for tasks assessed/performed Overall Cognitive Status: No family/caregiver present to determine baseline cognitive functioning (Oriented to person, place, and situation; appeared confused in general.)                 General Comments: Pt trying to button up shirt that had no buttons (was a pullover shirt)    General Comments General comments (skin integrity, edema, etc.): Pt requesting to toilet during session.  Nursing cleared pt for participation in physical therapy.  Pt agreeable to PT session.  Pt reports LE pain 95% gone with use of SCD's.    Exercises  Transfer and gait training.   Assessment/Plan    PT Assessment Patient needs continued PT services  PT Problem List Decreased strength;Decreased activity tolerance;Decreased balance;Decreased mobility;Decreased  cognition;Decreased knowledge of use of DME;Decreased safety awareness;Decreased knowledge of precautions       PT Treatment Interventions DME instruction;Gait training;Stair training;Functional mobility training;Therapeutic activities;Therapeutic exercise;Balance training;Patient/family education    PT Goals (Current goals can be found in the Care Plan section)  Acute Rehab PT Goals Patient Stated Goal: to get stronger PT Goal Formulation: With patient Time For Goal Achievement: 02/06/17 Potential to Achieve Goals: Good    Frequency Min 2X/week   Barriers to discharge Decreased caregiver support      Co-evaluation               End of Session Equipment Utilized During Treatment: Gait belt Activity Tolerance: Patient limited by fatigue Patient left: in bed;with call bell/phone within reach;with bed alarm set;with SCD's reapplied (B heels elevated via pillow) Nurse Communication: Mobility status;Precautions PT Visit Diagnosis: Muscle weakness (generalized) (M62.81);Difficulty in walking, not elsewhere classified (R26.2)         Time: 1343-1430 PT Time Calculation (min) (ACUTE ONLY): 47 min   Charges:   PT Evaluation $PT Eval Low Complexity: 1 Procedure PT Treatments $Gait Training: 8-22 mins $Therapeutic Activity: 8-22 mins   PT G CodesLeitha Bleak, PT 01/23/17, 3:25 PM [phone]

## 2017-01-23 NOTE — [place] FL2 (Signed)
[place] LEVEL OF CARE SCREENING TOOL     IDENTIFICATION  Patient Name: Dean Garcia Birthdate: [birth year] Sex: male Admission Date (Current Location): 01/19/2017  [place] and [place] Number:  Engineering geologist and Address:  [facility], [address], [place], [place] [address]      Provider Number: [id number]  Attending Physician Name and Address:  Vaughan Basta, MD  Relative Name and Phone Number:       Current Level of Care: Hospital Recommended Level of Care: [place] Prior Approval Number:    Date Approved/Denied:   PASRR Number:   Discharge Plan: SNF    Current Diagnoses: Patient Active Problem List   Diagnosis Date Noted  . Hypoglycemia 01/19/2017  . Depression 01/19/2017  . Hypoglycemia due to insulin 01/19/2017  . Muscle weakness (generalized)   . SOB (shortness of breath)   . Palliative care by specialist   . HCAP (healthcare-associated pneumonia) 11/29/2016  . Pressure injury of skin 11/20/2016  . Palliative care encounter   . Goals of care, counseling/discussion   . Acute renal failure superimposed on chronic kidney disease ([place])   . ARF (acute renal failure) ([place]) 11/19/2016  . Moderate major depression, single episode ([place]) 10/22/2016  . Mild dementia 10/22/2016  . Paroxysmal atrial fibrillation ([place]) 02/21/2016  . A-fib ([place]) 02/12/2016  . Centrilobular emphysema ([place]) 12/27/2015  . B12 deficiency 04/12/2015  . Atherosclerosis of aorta ([place]) 10/23/2014  . 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 Has a tremor 07/17/2014  . Type 2 diabetes mellitus ([place]) 07/17/2014  . Edema 07/17/2014  . Billowing mitral valve 07/12/2014  . BP (high blood pressure) 07/12/2014  . Carotid artery narrowing 07/12/2014  . Impaired renal function 06/07/2014  . Proctitis, radiation 06/07/2014  . Malignant neoplasm of prostate ([place]) 06/07/2014  . Peripheral nerve disease ([place]) 06/07/2014  . Arthritis,  degenerative 06/07/2014  . Disorder of peripheral nervous system ([place]) 06/07/2014  . Gout 06/07/2014  . 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 DD (diverticular disease) 06/07/2014  . Disease of rectum 06/07/2014  . Diabetes mellitus ([place]) 06/07/2014  . Malignant neoplasm of urinary bladder (HCC) 06/07/2014    Orientation RESPIRATION BLADDER Height & Weight     Self, Place, Situation  Normal Incontinent Weight: 153 lb 9.6 oz (69.7 kg) Height:  6\' 1"  (185.4 cm)  BEHAVIORAL SYMPTOMS/MOOD NEUROLOGICAL BOWEL NUTRITION STATUS   (none)  (none) Incontinent Diet (Diet: Carb Modified )  AMBULATORY STATUS COMMUNICATION OF NEEDS Skin   Extensive Assist Verbally PU Stage and Appropriate Care (Pressure Ulcer Stage 1 on sacrum. Pressure Ulcer stage 2 on left heel. )                       Personal Care Assistance Level of Assistance  Bathing, Feeding, Dressing Bathing Assistance: Limited assistance Feeding assistance: Independent Dressing Assistance: Limited assistance     Functional Limitations Info  Sight, Hearing, Speech Sight Info: Adequate Hearing Info: Impaired Speech Info: Adequate    SPECIAL CARE FACTORS FREQUENCY  PT (By licensed PT), OT (By licensed OT)     PT Frequency:  (5) OT Frequency:  (5)            Contractures      Additional Factors Info  Code Status, Allergies, Insulin Sliding Scale Code Status Info:  (Full Code. ) Allergies Info:  (Demerol Meperidine)   Insulin Sliding Scale Info:  (NovoLog Insulin Injections. )       Current  Medications (01/23/2017):  This is the current hospital active medication list Current Facility-Administered Medications  Medication Dose Route Frequency Provider Last Rate Last Dose  . acetaminophen (TYLENOL) tablet 650 mg  650 mg Oral Q6H PRN Idelle Crouch, MD       Or  . acetaminophen (TYLENOL) suppository 650 mg  650 mg Rectal Q6H PRN Idelle Crouch, MD      . allopurinol (ZYLOPRIM) tablet  100 mg  100 mg Oral Daily Idelle Crouch, MD   100 mg at 01/23/17 1007  . amLODipine (NORVASC) tablet 5 mg  5 mg Oral Daily Vaughan Basta, MD   5 mg at 01/23/17 1007  . apixaban (ELIQUIS) tablet 2.5 mg  2.5 mg Oral BID Idelle Crouch, MD   2.5 mg at 01/23/17 1007  . bisacodyl (DULCOLAX) suppository 10 mg  10 mg Rectal Daily PRN Idelle Crouch, MD      . docusate sodium (COLACE) capsule 100 mg  100 mg Oral BID Idelle Crouch, MD   100 mg at 01/23/17 1007  . DULoxetine (CYMBALTA) DR capsule 30 mg  30 mg Oral Daily Idelle Crouch, MD   30 mg at 01/23/17 1007  . HYDROcodone-acetaminophen (NORCO) 10-325 MG per tablet 1 tablet  1 tablet Oral Q4H PRN Idelle Crouch, MD   1 tablet at 01/23/17 1019  . insulin aspart (novoLOG) injection 0-9 Units  0-9 Units Subcutaneous TID WC Idelle Crouch, MD   1 Units at 01/21/17 1227  . mirtazapine (REMERON) tablet 30 mg  30 mg Oral QHS Gonzella Lex, MD   30 mg at 01/22/17 2129  . mupirocin cream (BACTROBAN) 2 %   Topical Daily Vaughan Basta, MD      . ondansetron [facility]) tablet 4 mg  4 mg Oral Q6H PRN Idelle Crouch, MD       Or  . ondansetron [facility]) injection 4 mg  4 mg Intravenous Q6H PRN Idelle Crouch, MD      . pantoprazole (PROTONIX) EC tablet 40 mg  40 mg Oral QAC breakfast Idelle Crouch, MD   40 mg at 01/23/17 1007  . potassium chloride (K-DUR,KLOR-CON) CR tablet 10 mEq  10 mEq Oral Daily Vaughan Basta, MD   10 mEq at 01/23/17 1007  . primidone (MYSOLINE) tablet 50 mg  50 mg Oral BID Idelle Crouch, MD   50 mg at 01/23/17 1007  . sodium chloride flush (NS) 0.9 % injection 3 mL  3 mL Intravenous Q12H Idelle Crouch, MD   3 mL at 01/23/17 1019  . tamsulosin (FLOMAX) capsule 0.4 mg  0.4 mg Oral Daily Idelle Crouch, MD   0.4 mg at 01/23/17 1007  . torsemide (DEMADEX) tablet 20 mg  20 mg Oral BID Vaughan Basta, MD   20 mg at 01/23/17 1007  . traZODone (DESYREL) tablet 50 mg  50 mg Oral QHS PRN  Vaughan Basta, MD         Discharge Medications: Please see discharge summary for a list of discharge medications.  Relevant Imaging Results:  Relevant Lab Results:   Additional Information  (SSN: [id number])  Sample, Dean Beets, LCSW

## 2017-01-24 LAB — BASIC METABOLIC PANEL
ANION GAP: 9 (ref 5–15)
BUN: 35 mg/dL — ABNORMAL HIGH (ref 6–20)
CO2: 27 mmol/L (ref 22–32)
Calcium: 8.1 mg/dL — ABNORMAL LOW (ref 8.9–10.3)
Chloride: 105 mmol/L (ref 101–111)
Creatinine, Ser: 2.18 mg/dL — ABNORMAL HIGH (ref 0.61–1.24)
GFR, EST AFRICAN AMERICAN: 30 mL/min — AB (ref >60)
GFR, EST NON AFRICAN AMERICAN: 26 mL/min — AB (ref >60)
GLUCOSE: 91 mg/dL (ref 65–99)
POTASSIUM: 3.8 mmol/L (ref 3.5–5.1)
Sodium: 141 mmol/L (ref 135–145)

## 2017-01-24 LAB — CBC
HEMATOCRIT: 35 % — AB (ref 40.0–52.0)
HEMOGLOBIN: 11.4 g/dL — AB (ref 13.0–18.0)
MCH: 29.4 pg (ref 26.0–34.0)
MCHC: 32.6 g/dL (ref 32.0–36.0)
MCV: 90.4 fL (ref 80.0–100.0)
Platelets: 238 10*3/uL (ref 150–440)
RBC: 3.87 MIL/uL — AB (ref 4.40–5.90)
RDW: 15.6 % — ABNORMAL HIGH (ref 11.5–14.5)
WBC: 7.7 10*3/uL (ref 3.8–10.6)

## 2017-01-24 LAB — GLUCOSE, CAPILLARY
Glucose-Capillary: 88 mg/dL (ref 65–99)
Glucose-Capillary: 117 mg/dL — ABNORMAL HIGH (ref 65–99)

## 2017-01-24 MED ORDER — AMLODIPINE BESYLATE 5 MG PO TABS: 5.0000 mg | ORAL_TABLET | Freq: Every day | ORAL | 0 refills | Status: DC

## 2017-01-24 MED ORDER — MIRTAZAPINE 30 MG PO TABS: 30.0000 mg | ORAL_TABLET | Freq: Every day | ORAL | 0 refills | Status: DC

## 2017-01-24 MED ORDER — DULOXETINE HCL 30 MG PO CPEP: 30.0000 mg | ORAL_CAPSULE | Freq: Every day | ORAL | 0 refills | Status: DC

## 2017-01-24 MED ORDER — DOCUSATE SODIUM 100 MG PO CAPS: 100.0000 mg | ORAL_CAPSULE | Freq: Two times a day (BID) | ORAL | 0 refills | Status: DC

## 2017-01-24 MED ORDER — HYDROCODONE-ACETAMINOPHEN 10-325 MG PO TABS: 1.0000 | ORAL_TABLET | Freq: Four times a day (QID) | ORAL | 0 refills | Status: DC | PRN

## 2017-01-24 NOTE — Clinical Social Work Placement (Signed)
   CLINICAL SOCIAL WORK PLACEMENT  NOTE  Date:  01/24/2017  Patient Details  Name: Dean Garcia MRN: [id number] Date of Birth: February 24, 1928  Clinical Social Work is seeking post-discharge placement for this patient at the [place] level of care (*CSW will initial, date and re-position this form in  chart as items are completed):  Yes   Patient/family provided with [place] Work Department's list of facilities offering this level of care within the geographic area requested by the patient (or if unable, by the patient's family).  Yes   Patient/family informed of their freedom to choose among providers that offer the needed level of care, that participate in Medicare, Medicaid or managed care program needed by the patient, have an available bed and are willing to accept the patient.  Yes   Patient/family informed of [facility]'s ownership interest in [facility] and [facility], as well as of the fact that they are under no obligation to receive care at these facilities.  PASRR submitted to EDS on 01/23/17     PASRR number received on 01/23/17     Existing PASRR number confirmed on       FL2 transmitted to all facilities in geographic area requested by pt/family on 01/23/17     FL2 transmitted to all facilities within larger geographic area on       Patient informed that his/her managed care company has contracts with or will negotiate with certain facilities, including the following:        Yes   Patient/family informed of bed offers received.  Patient chooses bed at [facility] )     Physician recommends and patient chooses bed at      Patient to be transferred to   on 01/24/17.  Patient to be transferred to facility by [facility]     Patient family notified on 01/24/17 of transfer.  Name of family member notified:  Pt's wife     PHYSICIAN Please prepare priority discharge summary, including medications      Additional Comment:    _______________________________________________ Darden Dates, LCSW 01/24/2017, 12:15 PM

## 2017-01-24 NOTE — Discharge Summary (Addendum)
[place] [facility] [place] NAME: Dean Garcia    MR#:  [id number]  DATE OF BIRTH:  1928-04-27  DATE OF ADMISSION:  01/19/2017 ADMITTING PHYSICIAN: Idelle Crouch, MD  DATE OF DISCHARGE: 01/24/2017  PRIMARY CARE PHYSICIAN: Kirk Ruths., MD    ADMISSION DIAGNOSIS:  Hypoglycemia [E16.2] AKI (acute kidney injury) ([place]) [N17.9] Overdose of insulin, intentional self-harm, initial encounter ([place]) [T38.3X2A]  DISCHARGE DIAGNOSIS:  Principal Problem:   Moderate major depression, single episode ([place]) Active Problems:   Peripheral nerve disease ([place])   Type 2 diabetes mellitus ([place])   Hypoglycemia   Depression   Hypoglycemia due to insulin   SECONDARY DIAGNOSIS:   Past Medical History:  Diagnosis Date  . Atrial fibrillation ([place])   . Bladder cancer ([place])   . Bundle branch block, right   . Carotid stenosis   . Colon polyp   . CVA (cerebral infarction)   . Diabetes ([place])   . Gout   . High cholesterol   . Hypertension   . Mitral valve prolapse   . Prostate cancer ([place])   . Tremor     HOSPITAL COURSE:   * Hypoglycemia due to intentional overdose of insulin secondary to suicidal tendency   Currently on involuntary commitment, operated psychiatric consult.   IV fluid dextrose was given initially at 125 ML per hour and as blood sugar was stable, decreased to 50 mL per hour  earlier and then discontinue and monitor him overnight.   He is tolerating his diet.   Blood sugar stayed stable without supplemental IV fluids were requiring insulin.  * Hypertension   Continue amlodipine  * Atrial fibrillation   Continue amiodarone, Apixaban  * Depression   Continue Remeron and Cymbalta   Psychiatric consult is appreciated.  * Leg cramps   As per wife for last 1-2 years his primary care physician has tried multiple different medications and also send him to vascular studies but no clear reason was found.   give him SCDs to  help with that.  Seems SCDs are helping.   On trazodone and psych meds.   He need sequential compressive device on both legs every night and as needed in day time.  DISCHARGE CONDITIONS:   Stable.  CONSULTS OBTAINED:  Treatment Team:  Gonzella Lex, MD  DRUG ALLERGIES:   Allergies  Allergen Reactions  . Demerol [Meperidine] Nausea And Vomiting and Nausea Only    Other reaction(s): Nausea And Vomiting, Vomiting Nausea/vomiting    DISCHARGE MEDICATIONS:   Current Discharge Medication List    START taking these medications   Details  docusate sodium (COLACE) 100 MG capsule Take 1 capsule (100 mg total) by mouth 2 (two) times daily. Qty: 10 capsule, Refills: 0    DULoxetine (CYMBALTA) 30 MG capsule Take 1 capsule (30 mg total) by mouth daily. Qty: 30 capsule, Refills: 0    HYDROcodone-acetaminophen (NORCO) 10-325 MG tablet Take 1 tablet by mouth every 6 (six) hours as needed for moderate pain. Qty: 20 tablet, Refills: 0      CONTINUE these medications which have CHANGED   Details  amLODipine (NORVASC) 5 MG tablet Take 1 tablet (5 mg total) by mouth daily. Qty: 30 tablet, Refills: 0    !! mirtazapine (REMERON) 30 MG tablet Take 1 tablet (30 mg total) by mouth at bedtime. Qty: 30 tablet, Refills: 0     !! - Potential duplicate medications found. Please discuss with provider.    CONTINUE  these medications which have NOT CHANGED   Details  allopurinol (ZYLOPRIM) 100 MG tablet Take 100 mg by mouth daily.    apixaban (ELIQUIS) 2.5 MG TABS tablet Take 2.5 mg by mouth 2 (two) times daily.     cyanocobalamin (,VITAMIN B-12,) 1000 MCG/ML injection Inject 1,000 mcg into the muscle every 30 (thirty) days. 8th of month    !! mirtazapine (REMERON) 15 MG tablet Take 1 tablet (15 mg total) by mouth at bedtime. Qty: 30 tablet, Refills: 0    omeprazole (PRILOSEC) 20 MG capsule Take 20 mg by mouth daily.     ondansetron (ZOFRAN-ODT) 4 MG disintegrating tablet Take 4 mg by mouth  every 8 (eight) hours as needed for nausea or vomiting.     potassium chloride (K-DUR) 10 MEQ tablet Take 10 mEq by mouth daily.    primidone (MYSOLINE) 50 MG tablet Take 50 mg by mouth 2 (two) times daily.     ranitidine (ZANTAC) 150 MG tablet Take 300 mg by mouth every evening.     sitaGLIPtin (JANUVIA) 25 MG tablet Take 25 mg by mouth daily.     tamsulosin (FLOMAX) 0.4 MG CAPS capsule Take 1 capsule (0.4 mg total) by mouth daily. Qty: 90 capsule, Refills: 3    torsemide (DEMADEX) 20 MG tablet Take 20 mg by mouth 2 (two) times daily.    traZODone (DESYREL) 50 MG tablet Take 50 mg by mouth at bedtime.    glucose blood (ACCU-CHEK COMPACT PLUS) test strip     Insulin Pen Needle (B-D ULTRAFINE III SHORT PEN) 31G X 8 MM MISC USE AS DIRECTED     !! - Potential duplicate medications found. Please discuss with provider.    STOP taking these medications     cyclobenzaprine (FLEXERIL) 10 MG tablet      fexofenadine (ALLEGRA) 180 MG tablet          DISCHARGE INSTRUCTIONS:    Follow with PMD in 1 week.  If you experience worsening of your admission symptoms, develop shortness of breath, life threatening emergency, suicidal or homicidal thoughts you must seek medical attention immediately by calling 911 or calling your MD immediately  if symptoms less severe.  You Must read complete instructions/literature along with all the possible adverse reactions/side effects for all the Medicines you take and that have been prescribed to you. Take any new Medicines after you have completely understood and accept all the possible adverse reactions/side effects.   Please note  You were cared for by a hospitalist during your hospital stay. If you have any questions about your discharge medications or the care you received while you were in the hospital after you are discharged, you can call the unit and asked to speak with the hospitalist on call if the hospitalist that took care of you is not  available. Once you are discharged, your primary care physician will handle any further medical issues. Please note that NO REFILLS for any discharge medications will be authorized once you are discharged, as it is imperative that you return to your primary care physician (or establish a relationship with a primary care physician if you do not have one) for your aftercare needs so that they can reassess your need for medications and monitor your lab values.    Today   CHIEF COMPLAINT:   Chief Complaint  Patient presents with  . Hypoglycemia    HISTORY OF PRESENT ILLNESS:  Dean Garcia  is a 83 y.o. male with a known history of  A-fib, CKD, PVD, and chronic LE pain now with hypoglycemia after taking overdose of insulin as a suicide attempt. Pain is "tired" of his chronic pain and "cannot go on like this any longer". In ER, pt given D50  With improvement of sugars but they remain low. He is now admitted. No fever. Denies CP or SOB.   VITAL SIGNS:  Blood pressure (!) 180/63, pulse 62, temperature 98.2 F (36.8 C), temperature source Oral, resp. rate 16, height 6\' 1"  (1.854 m), weight 75.9 kg (167 lb 4.8 oz), SpO2 99 %.  I/O:   Intake/Output Summary (Last 24 hours) at 01/24/17 0918 Last data filed at 01/23/17 1830  Gross per 24 hour  Intake              240 ml  Output                0 ml  Net              240 ml    PHYSICAL EXAMINATION:   GENERAL:  83 y.o.-year-old patient lying in the bed with no acute distress.  EYES: Pupils equal, round, reactive to light and accommodation. No scleral icterus. Extraocular muscles intact.  HEENT: Head atraumatic, normocephalic. Oropharynx and nasopharynx clear.  NECK:  Supple, no jugular venous distention. No thyroid enlargement, no tenderness.  LUNGS: Normal breath sounds bilaterally, no wheezing, rales,rhonchi or crepitation. No use of accessory muscles of respiration.  CARDIOVASCULAR: S1, S2 normal. No murmurs, rubs, or gallops.  ABDOMEN: Soft,  nontender, nondistended. Bowel sounds present. No organomegaly or mass.  EXTREMITIES: No pedal edema, cyanosis, or clubbing.  NEUROLOGIC: Cranial nerves II through XII are intact. Muscle strength 4/5 in all extremities. Sensation intact. Gait not checked.  PSYCHIATRIC: The patient is alert and oriented x 1.  SKIN: No obvious rash, lesion, or ulcer.    DATA REVIEW:   CBC  Recent Labs Lab 01/24/17 0509  WBC 7.7  HGB 11.4*  HCT 35.0*  PLT 238    Chemistries   Recent Labs Lab 01/20/17 0420 01/22/17 0539 01/24/17 0509  NA 138  --  141  K 3.9  --  3.8  CL 106  --  105  CO2 27  --  27  GLUCOSE 114*  --  91  BUN 31*  --  35*  CREATININE 1.79*  --  2.18*  CALCIUM 7.7*  --  8.1*  MG  --  2.1  --   AST 18  --   --   ALT 9*  --   --   ALKPHOS 51  --   --   BILITOT 0.3  --   --     Cardiac Enzymes  Recent Labs Lab 01/19/17 1153  TROPONINI <0.03    Microbiology Results  Results for orders placed or performed during the hospital encounter of 03/02/15  Culture, blood (single)     Status: None   Collection Time: 03/02/15  4:25 PM  Result Value Ref Range Status   Micro Text Report   Final       COMMENT                   NO GROWTH AEROBICALLY/ANAEROBICALLY IN 5 DAYS   ANTIBIOTIC  Culture, blood (single)     Status: None   Collection Time: 03/02/15  4:42 PM  Result Value Ref Range Status   Micro Text Report   Final       COMMENT                   NO GROWTH AEROBICALLY/ANAEROBICALLY IN 5 DAYS   ANTIBIOTIC                                                        RADIOLOGY:  No results found.  EKG:   Orders placed or performed during the hospital encounter of 01/19/17  . ED EKG  . ED EKG  . EKG 12-Lead  . EKG 12-Lead      Management plans discussed with the patient, family and they are in agreement.  CODE STATUS: DNR    Code Status Orders        Start     Ordered   01/20/17 0011  DNR   Continuous     01/20/17 0010    Code Status History    Date Active Date Inactive Code Status Order ID Comments User Context   01/19/2017  9:54 PM 01/20/2017 12:10 AM DNR [id number]  Harvie Bridge, DO ED   11/29/2016  5:16 AM 12/04/2016  8:01 PM DNR [id number]  Harrie Foreman, MD Inpatient   11/19/2016  7:00 PM 11/23/2016  8:20 PM DNR [id number]  Gladstone Lighter, MD Inpatient   02/12/2016  8:30 PM 02/14/2016  6:23 PM DNR [id number]  Hillary Bow, MD ED    Advance Directive Documentation   Flowsheet Row Most Recent Value  Type of Advance Directive  Out of facility DNR (pink MOST or yellow form)  Pre-existing out of facility DNR order (yellow form or pink MOST form)  -- [Pt was ask to bring a copy]  "MOST" Form in Place?  No data      TOTAL TIME TAKING CARE OF THIS PATIENT: 35 minutes.    Vaughan Basta M.D on 01/24/2017 at 9:18 AM  Between 7am to 6pm - Pager - [phone]  After 6pm go to [email or web address] - password [email or web address] [facility]  Office  [phone]  CC: Primary care physician; Kirk Ruths., MD   Note: This dictation was prepared with Dragon dictation along with smaller phrase technology. Any transcriptional errors that result from this process are unintentional.

## 2017-01-24 NOTE — Progress Notes (Signed)
MD making rounds. Order received to discharge to [facility]. IV removed. CSW facilitating transfer to facility. Report called to [place], Therapist, sports. EMS paged for transport. Awaiting EMS.

## 2017-01-24 NOTE — Plan of Care (Signed)
EMS on Unit for transport. Discharged via stretcher by EMS. Belongings sent with family.

## 2017-01-24 NOTE — Clinical Social Work Note (Signed)
Pt is ready for discharge today and will go [facility]. Facility is ready to accept pt as they received discharge information. Pt's wife is aware and agreeable to discharge plan. RN called report. [facility] will provide transportation. CSW is signing off as no further needs identified.   Darden Dates, MSW, LCSW  Clinical Social Worker  [phone]

## 2017-01-24 NOTE — Plan of Care (Signed)
Problem: Skin Integrity: Goal: Risk for impaired skin integrity will decrease Outcome: Not Progressing Pressure injury to left heel with daily dressing changes.Sacral area with intact foam dressing.

## 2017-01-28 DIAGNOSIS — I5032 Chronic diastolic (congestive) heart failure: Secondary | ICD-10-CM

## 2017-01-28 DIAGNOSIS — I480 Paroxysmal atrial fibrillation: Secondary | ICD-10-CM | POA: Diagnosis not present

## 2017-01-28 DIAGNOSIS — E11621 Type 2 diabetes mellitus with foot ulcer: Secondary | ICD-10-CM

## 2017-01-28 DIAGNOSIS — N184 Chronic kidney disease, stage 4 (severe): Secondary | ICD-10-CM

## 2017-01-28 DIAGNOSIS — F332 Major depressive disorder, recurrent severe without psychotic features: Secondary | ICD-10-CM | POA: Diagnosis not present

## 2017-02-07 ENCOUNTER — Ambulatory Visit (INDEPENDENT_AMBULATORY_CARE_PROVIDER_SITE_OTHER): Payer: Medicare Other | Admitting: Vascular Surgery

## 2017-02-07 ENCOUNTER — Encounter (INDEPENDENT_AMBULATORY_CARE_PROVIDER_SITE_OTHER): Payer: Self-pay | Admitting: Vascular Surgery

## 2017-02-07 VITALS — BP 127/61 | HR 69 | Resp 16 | Wt 163.0 lb

## 2017-02-07 DIAGNOSIS — G64 Other disorders of peripheral nervous system: Secondary | ICD-10-CM

## 2017-02-07 DIAGNOSIS — M79604 Pain in right leg: Secondary | ICD-10-CM

## 2017-02-07 DIAGNOSIS — M79605 Pain in left leg: Secondary | ICD-10-CM | POA: Diagnosis not present

## 2017-02-07 DIAGNOSIS — M159 Polyosteoarthritis, unspecified: Secondary | ICD-10-CM

## 2017-02-07 DIAGNOSIS — I700 Atherosclerosis of aorta: Secondary | ICD-10-CM | POA: Diagnosis not present

## 2017-02-07 DIAGNOSIS — G629 Polyneuropathy, unspecified: Secondary | ICD-10-CM

## 2017-02-07 DIAGNOSIS — M150 Primary generalized (osteo)arthritis: Secondary | ICD-10-CM | POA: Diagnosis not present

## 2017-02-07 DIAGNOSIS — M79609 Pain in unspecified limb: Secondary | ICD-10-CM | POA: Insufficient documentation

## 2017-02-07 DIAGNOSIS — I10 Essential (primary) hypertension: Secondary | ICD-10-CM

## 2017-02-07 NOTE — Progress Notes (Signed)
MRN : [id number]  Dean Garcia is a 83 y.o. (1928/06/16) male who presents with chief complaint of  Chief Complaint  Patient presents with  . Follow-up  .  History of Present Illness: The patient is seen for evaluation of painful lower extremities. Patient notes the pain is variable and not always associated with activity.  The pain is somewhat consistent day to day occurring on most days. The patient notes the pain also occurs with standing and routinely seems worse as the day wears on. The pain has been progressive over the past several years. The patient states these symptoms are causing  a profound negative impact on quality of life and daily activities.  The patient denies rest pain or dangling of an extremity off the side of the bed during the night for relief. No open wounds or sores at this time. No history of DVT or phlebitis. No prior interventions or surgeries.  There is a  history of back problems and DJD of the lumbar and sacral spine.   Current Meds  Medication Sig  . allopurinol (ZYLOPRIM) 100 MG tablet Take 100 mg by mouth daily.  Marland Kitchen amLODipine (NORVASC) 5 MG tablet Take 1 tablet (5 mg total) by mouth daily.  Marland Kitchen apixaban (ELIQUIS) 2.5 MG TABS tablet Take 2.5 mg by mouth 2 (two) times daily.   . cyanocobalamin (,VITAMIN B-12,) 1000 MCG/ML injection Inject 1,000 mcg into the muscle every 30 (thirty) days. 8th of month  . docusate sodium (COLACE) 100 MG capsule Take 1 capsule (100 mg total) by mouth 2 (two) times daily.  . DULoxetine (CYMBALTA) 30 MG capsule Take 1 capsule (30 mg total) by mouth daily.  Marland Kitchen glucose blood (ACCU-CHEK COMPACT PLUS) test strip   . HYDROcodone-acetaminophen (NORCO) 10-325 MG tablet Take 1 tablet by mouth every 6 (six) hours as needed for moderate pain.  . Insulin Pen Needle (B-D ULTRAFINE III SHORT PEN) 31G X 8 MM MISC USE AS DIRECTED  . mirtazapine (REMERON) 30 MG tablet Take 1 tablet (30 mg total) by mouth at bedtime.  Marland Kitchen omeprazole (PRILOSEC) 20  MG capsule Take 20 mg by mouth daily.   . ondansetron (ZOFRAN-ODT) 4 MG disintegrating tablet Take 4 mg by mouth every 8 (eight) hours as needed for nausea or vomiting.   . potassium chloride (K-DUR) 10 MEQ tablet Take 10 mEq by mouth daily.  . primidone (MYSOLINE) 50 MG tablet Take 50 mg by mouth 2 (two) times daily.   . ranitidine (ZANTAC) 150 MG tablet Take 300 mg by mouth every evening.   . sitaGLIPtin (JANUVIA) 25 MG tablet Take 25 mg by mouth daily.   . tamsulosin (FLOMAX) 0.4 MG CAPS capsule Take 1 capsule (0.4 mg total) by mouth daily.  Marland Kitchen torsemide (DEMADEX) 20 MG tablet Take 20 mg by mouth 2 (two) times daily.  . traZODone (DESYREL) 50 MG tablet Take 50 mg by mouth at bedtime.  . Wound Dressings (ALLANTOIN) gel Apply topically as needed for wound care.    Past Medical History:  Diagnosis Date  . Atrial fibrillation ([place])   . Bladder cancer ([place])   . Bundle branch block, right   . Carotid stenosis   . Colon polyp   . CVA (cerebral infarction)   . Diabetes ([place])   . Gout   . High cholesterol   . Hypertension   . Mitral valve prolapse   . Prostate cancer ([place])   . Tremor     Past Surgical History:  Procedure Laterality  Date  . APPENDECTOMY    . CAROTID ARTERY ANGIOPLASTY    . CHOLECYSTECTOMY      Social History Social History  Substance Use Topics  . Smoking status: Former Smoker    Types: Pipe    Quit date: 10/29/2010  . Smokeless tobacco: Never Used  . Alcohol use Yes     Comment: 1 beer a day    Family History Family History  Problem Relation Age of Onset  . Hypertension Father   . Stroke Father   . Diabetes Mother   . Prostate cancer Neg Hx   . Kidney cancer Neg Hx   . Kidney disease Neg Hx   No family history of bleeding/clotting disorders, porphyria or autoimmune disease   Allergies  Allergen Reactions  . Demerol [Meperidine] Nausea And Vomiting and Nausea Only    Other reaction(s): Nausea And Vomiting, Vomiting Nausea/vomiting     REVIEW  OF SYSTEMS (Negative unless checked)  Constitutional: [] Weight loss  [] Fever  [] Chills Cardiac: [] Chest pain   [] Chest pressure   [] Palpitations   [] Shortness of breath when laying flat   [] Shortness of breath with exertion. Vascular:  [] Pain in legs with walking   [x] Pain in legs at rest  [] History of DVT   [] Phlebitis   [x] Swelling in legs   [] Varicose veins   [] Non-healing ulcers Pulmonary:   [] Uses home oxygen   [] Productive cough   [] Hemoptysis   [] Wheeze  [] COPD   [] Asthma Neurologic:  [] Dizziness   [] Seizures   [] History of stroke   [] History of TIA  [] Aphasia   [] Vissual changes   [] Weakness or numbness in arm   [] Weakness or numbness in leg Musculoskeletal:   [] Joint swelling   [] Joint pain   [] Low back pain Hematologic:  [] Easy bruising  [] Easy bleeding   [] Hypercoagulable state   [] Anemic Gastrointestinal:  [x] fecal incntinence   [] Vomiting  [] Gastroesophageal reflux/heartburn   [] Difficulty swallowing. Genitourinary:  [] Chronic kidney disease   [] Difficult urination  [] Frequent urination   [] Blood in urine Skin:  [] Rashes   [] Ulcers  Psychological:  [] History of anxiety   []  History of major depression.  Physical Examination  Vitals:   02/07/17 0940  BP: 127/61  Pulse: 69  Resp: 16  Weight: 163 lb (73.9 kg)   Body mass index is 21.51 kg/m. Gen: frail and very thin debilitated appearing/WN, NAD Head: [place]/AT, No temporalis wasting.  Ear/Nose/Throat: Hearing grossly intact, nares w/o erythema or drainage, poor dentition Eyes: PER, EOMI, sclera nonicteric.  Neck: Supple, no masses.  No bruit or JVD.  Pulmonary:  Good air movement, clear to auscultation bilaterally, no use of accessory muscles.  Cardiac: RRR, normal S1, S2, no Murmurs. Vascular: legs with 2+ edema and moderate venous insufficiency Vessel Right Left  Radial Palpable Palpable  Ulnar Palpable Palpable  Brachial Palpable Palpable  Carotid Palpable Palpable  Femoral Palpable Palpable  Popliteal Trace Palpable  Not Palpable  PT Trace Palpable Trace Palpable  DP Trace Palpable Trace Palpable   Gastrointestinal: soft, non-distended. No guarding/no peritoneal signs.  Musculoskeletal: M/S 5/5 throughout.  No deformity or atrophy. Walks with a walker Neurologic: CN 2-12 intact. Pain and light touch intact in extremities.  Symmetrical.  Speech is fluent. Motor exam as listed above. Psychiatric: Judgment intact, Mood & affect appropriate for pt's clinical situation. Dermatologic: No rashes or ulcers noted.  No changes consistent with cellulitis. Lymph : No Cervical lymphadenopathy, no lichenification or skin changes of chronic lymphedema.  CBC Lab Results  Component Value Date  WBC 7.7 01/24/2017   HGB 11.4 (L) 01/24/2017   HCT 35.0 (L) 01/24/2017   MCV 90.4 01/24/2017   PLT 238 01/24/2017    BMET    Component Value Date/Time   NA 141 01/24/2017 0509   NA 140 03/02/2015 2019   K 3.8 01/24/2017 0509   K 3.1 (L) 03/02/2015 2019   CL 105 01/24/2017 0509   CL 98 (L) 03/02/2015 2019   CO2 27 01/24/2017 0509   CO2 29 03/02/2015 2019   GLUCOSE 91 01/24/2017 0509   GLUCOSE 149 (H) 03/02/2015 2019   BUN 35 (H) 01/24/2017 0509   BUN 62 (H) 03/02/2015 2019   CREATININE 2.18 (H) 01/24/2017 0509   CREATININE 2.14 (H) 03/02/2015 2019   CALCIUM 8.1 (L) 01/24/2017 0509   CALCIUM 8.1 (L) 03/02/2015 2019   GFRNONAA 26 (L) 01/24/2017 0509   GFRNONAA 27 (L) 03/02/2015 2019   GFRAA 30 (L) 01/24/2017 0509   GFRAA 32 (L) 03/02/2015 2019   Estimated Creatinine Clearance: 25.4 mL/min (by C-G formula based on SCr of 2.18 mg/dL (H)).  COAG Lab Results  Component Value Date   INR 1.06 02/14/2016   INR 1.01 02/12/2016   INR 1.2 02/26/2013    Radiology Dg Chest Portable 1 View  Result Date: 01/19/2017 CLINICAL DATA:  Pt presents to ED via [facility] with c/o hypoglycemia. Per EMS, EMS was dispatched to patient's house this morning and CBG was 26, pt received 1amp D50 at home then refused transport and ate  breakfast, EMS also states they were called back out, CBG was 46, 12.5 grams D50 given en route to hospital, last CBG obtained by EMS 119. On patient arrival to ER CBG 27, MD notified, received VORB for 1amp D50. Hx of Afib, CVA, diabetes, HTN, mitral valve prolapse. EXAM: PORTABLE CHEST 1 VIEW COMPARISON:  11/29/2016 FINDINGS: Cardiac silhouette is normal in size. No mediastinal or hilar masses. Small to moderate hiatal hernia. No evidence of adenopathy. Lungs are mildly hyperexpanded. There are increased markings in the bases, left greater than right. This may reflect combination scarring atelectasis. Pneumonia should be considered in the proper clinical setting. Stable granuloma in the left upper lobe. No pleural effusion.  No pneumothorax. Skeletal structures are demineralized. Old healed rib fractures on the left. IMPRESSION: 1. No convincing acute cardiopulmonary disease. 2. There is left greater than right lung base opacity has likely scarring, atelectasis or a combination, similar to the prior study. Consider pneumonia if there are consistent clinical symptoms. Electronically Signed   By: Lajean Manes M.D.   On: 01/19/2017 12:20    Assessment/Plan 1. Pain in both lower extremities Recommend:  I do not find evidence of Vascular pathology that would explain the patient's symptoms  The patient has atypical pain symptoms for vascular disease  Noninvasive studies including venous ultrasound of the legs do not identify vascular problems  The patient should continue walking and begin a more formal exercise program. The patient should continue his antiplatelet therapy and aggressive treatment of the lipid abnormalities. The patient should begin wearing graduated compression socks 15-20 mmHg strength to control her mild edema.  Patient will follow-up with me on an annual basis with ABI's  Further work-up of his lower extremity pain is deferred to the primary service    - VAS [place] ABI WITH/WO TBI;  Future  2. Atherosclerosis of aorta ([place]) See #1  3. Essential hypertension Continue antihypertensive medications as already ordered, these medications have been reviewed and there are no changes  at this time.   4. Peripheral nerve disease ([employer]) Work up per primary service  5. Primary osteoarthritis involving multiple joints Continue NSAID medications as already ordered, these medications have been reviewed and there are no changes at this time.     Hortencia Pilar, MD  02/07/2017 11:23 AM

## 2017-02-20 ENCOUNTER — Emergency Department: Payer: Medicare Other

## 2017-02-20 ENCOUNTER — Emergency Department
Admission: EM | Admit: 2017-02-20 | Discharge: 2017-02-20 | Disposition: A | Discharge status: Home / Self Care | Payer: Medicare Other | Source: Home / Self Care | Attending: Emergency Medicine | Admitting: Emergency Medicine

## 2017-02-20 ENCOUNTER — Encounter: Payer: Self-pay | Admitting: Intensive Care

## 2017-02-20 DIAGNOSIS — N390 Urinary tract infection, site not specified: Secondary | ICD-10-CM

## 2017-02-20 DIAGNOSIS — N50819 Testicular pain, unspecified: Secondary | ICD-10-CM

## 2017-02-20 DIAGNOSIS — Z79899 Other long term (current) drug therapy: Secondary | ICD-10-CM

## 2017-02-20 DIAGNOSIS — E119 Type 2 diabetes mellitus without complications: Secondary | ICD-10-CM

## 2017-02-20 DIAGNOSIS — I10 Essential (primary) hypertension: Secondary | ICD-10-CM

## 2017-02-20 DIAGNOSIS — Z87891 Personal history of nicotine dependence: Secondary | ICD-10-CM | POA: Insufficient documentation

## 2017-02-20 DIAGNOSIS — N451 Epididymitis: Secondary | ICD-10-CM | POA: Diagnosis not present

## 2017-02-20 DIAGNOSIS — R339 Retention of urine, unspecified: Secondary | ICD-10-CM

## 2017-02-20 LAB — URINALYSIS, ROUTINE W REFLEX MICROSCOPIC
Bilirubin Urine: NEGATIVE
GLUCOSE, UA: NEGATIVE mg/dL
Ketones, ur: NEGATIVE mg/dL
Nitrite: NEGATIVE
PROTEIN: 100 mg/dL — AB
SPECIFIC GRAVITY, URINE: 1.011 (ref 1.005–1.030)
Squamous Epithelial / LPF: NONE SEEN
pH: 8 (ref 5.0–8.0)

## 2017-02-20 LAB — CBC
HCT: 33.7 % — ABNORMAL LOW (ref 40.0–52.0)
Hemoglobin: 11.2 g/dL — ABNORMAL LOW (ref 13.0–18.0)
MCH: 29 pg (ref 26.0–34.0)
MCHC: 33.1 g/dL (ref 32.0–36.0)
MCV: 87.6 fL (ref 80.0–100.0)
PLATELETS: 220 10*3/uL (ref 150–440)
RBC: 3.84 MIL/uL — ABNORMAL LOW (ref 4.40–5.90)
RDW: 15.8 % — ABNORMAL HIGH (ref 11.5–14.5)
WBC: 9.7 10*3/uL (ref 3.8–10.6)

## 2017-02-20 LAB — BASIC METABOLIC PANEL
ANION GAP: 8 (ref 5–15)
BUN: 49 mg/dL — ABNORMAL HIGH (ref 6–20)
CO2: 30 mmol/L (ref 22–32)
Calcium: 8.3 mg/dL — ABNORMAL LOW (ref 8.9–10.3)
Chloride: 101 mmol/L (ref 101–111)
Creatinine, Ser: 2.48 mg/dL — ABNORMAL HIGH (ref 0.61–1.24)
GFR calc Af Amer: 25 mL/min — ABNORMAL LOW (ref >60)
GFR, EST NON AFRICAN AMERICAN: 22 mL/min — AB (ref >60)
Glucose, Bld: 179 mg/dL — ABNORMAL HIGH (ref 65–99)
POTASSIUM: 4.3 mmol/L (ref 3.5–5.1)
SODIUM: 139 mmol/L (ref 135–145)

## 2017-02-20 MED ORDER — ONDANSETRON HCL 4 MG/2ML IJ SOLN
4.0000 mg | Freq: Once | INTRAMUSCULAR | Status: AC
  Administered 2017-02-20: 4 mg via INTRAVENOUS
  Filled 2017-02-20: qty 2

## 2017-02-20 MED ORDER — CEPHALEXIN 250 MG PO CAPS: 250.0000 mg | ORAL_CAPSULE | Freq: Two times a day (BID) | ORAL | 0 refills | Status: DC

## 2017-02-20 MED ORDER — FENTANYL CITRATE (PF) 100 MCG/2ML IJ SOLN
50.0000 ug | Freq: Once | INTRAMUSCULAR | Status: AC
  Administered 2017-02-20: 50 ug via INTRAVENOUS
  Filled 2017-02-20: qty 2

## 2017-02-20 MED ORDER — CEFTRIAXONE SODIUM-DEXTROSE 1-3.74 GM-% IV SOLR
INTRAVENOUS | Status: AC
  Administered 2017-02-20: 1 g via INTRAVENOUS
  Filled 2017-02-20: qty 50

## 2017-02-20 MED ORDER — FENTANYL CITRATE (PF) 100 MCG/2ML IJ SOLN
INTRAMUSCULAR | Status: AC
  Administered 2017-02-20: 100 ug via INTRAVENOUS
  Filled 2017-02-20: qty 2

## 2017-02-20 MED ORDER — FENTANYL CITRATE (PF) 100 MCG/2ML IJ SOLN
100.0000 ug | Freq: Once | INTRAMUSCULAR | Status: AC
  Administered 2017-02-20: 100 ug via INTRAVENOUS

## 2017-02-20 MED ORDER — DEXTROSE 5 % IV SOLN: 1.0000 g | Freq: Once | INTRAVENOUS | Status: DC

## 2017-02-20 MED ORDER — CEFTRIAXONE SODIUM-DEXTROSE 1-3.74 GM-% IV SOLR
1.0000 g | Freq: Once | INTRAVENOUS | Status: AC
  Administered 2017-02-20: 1 g via INTRAVENOUS

## 2017-02-20 NOTE — ED Provider Notes (Signed)
[facility] Emergency Department Provider Note  Time seen: 7:23 AM  I have reviewed the triage vital signs and the nursing notes.   HISTORY  Chief Complaint Testicle Pain and Foot Pain    HPI Dean Garcia is a 83 y.o. male with a past medical history of atrial fibrillation on a blood thinner, diabetes, hypertension, hyperlipidemia, presents to the emergency department for scrotal pain. According to the patient he has a long history of foot pain which she describes his foot spasms. Patient states this comes and goes and is fairly chronic for him. States some foot discomfort today. Patient's main concern today however as he awoke with pain in his testicles. Patient cannot describe which testicle is hurting but states this awoke him from his sleep approximately 1 hour ago. Has not urinated but denies any known dysuria denies any recent hematuria. Denies any diarrhea nausea vomiting or fever.  Past Medical History:  Diagnosis Date  . Atrial fibrillation ([place])   . Bladder cancer ([place])   . Bundle branch block, right   . Carotid stenosis   . Colon polyp   . CVA (cerebral infarction)   . Diabetes ([place])   . Gout   . High cholesterol   . Hypertension   . Mitral valve prolapse   . Prostate cancer ([place])   . Tremor     Patient Active Problem List   Diagnosis Date Noted  . Pain in limb 02/07/2017  . Hypoglycemia 01/19/2017  . Depression 01/19/2017  . Hypoglycemia due to insulin 01/19/2017  . Muscle weakness (generalized)   . SOB (shortness of breath)   . Palliative care by specialist   . HCAP (healthcare-associated pneumonia) 11/29/2016  . Pressure injury of skin 11/20/2016  . Palliative care encounter   . Goals of care, counseling/discussion   . Acute renal failure superimposed on chronic kidney disease ([place])   . ARF (acute renal failure) ([place]) 11/19/2016  . Moderate major depression, single episode ([place]) 10/22/2016  . Mild dementia 10/22/2016  . Paroxysmal  atrial fibrillation ([place]) 02/21/2016  . A-fib ([place]) 02/12/2016  . Centrilobular emphysema ([place]) 12/27/2015  . B12 deficiency 04/12/2015  . Atherosclerosis of aorta ([place]) 10/23/2014  . 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 Has a tremor 07/17/2014  . Type 2 diabetes mellitus ([place]) 07/17/2014  . Edema 07/17/2014  . Billowing mitral valve 07/12/2014  . BP (high blood pressure) 07/12/2014  . Carotid artery narrowing 07/12/2014  . Impaired renal function 06/07/2014  . Proctitis, radiation 06/07/2014  . Malignant neoplasm of prostate ([place]) 06/07/2014  . Peripheral nerve disease ([place]) 06/07/2014  . DJD (degenerative joint disease) 06/07/2014  . Disorder of peripheral nervous system ([place]) 06/07/2014  . Gout 06/07/2014  . 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 DD (diverticular disease) 06/07/2014  . Disease of rectum 06/07/2014  . Diabetes mellitus ([place]) 06/07/2014  . Malignant neoplasm of urinary bladder ([place]) 06/07/2014    Past Surgical History:  Procedure Laterality Date  . APPENDECTOMY    . CAROTID ARTERY ANGIOPLASTY    . CHOLECYSTECTOMY      Prior to Admission medications   Medication Sig Start Date End Date Taking? Authorizing Provider  allopurinol (ZYLOPRIM) 100 MG tablet Take 100 mg by mouth daily.    Historical Provider, MD  amLODipine (NORVASC) 5 MG tablet Take 1 tablet (5 mg total) by mouth daily. 01/25/17   Vaughan Basta, MD  apixaban (ELIQUIS) 2.5 MG TABS tablet Take 2.5 mg by mouth 2 (two) times daily.  02/21/16  Historical Provider, MD  cyanocobalamin (,VITAMIN B-12,) 1000 MCG/ML injection Inject 1,000 mcg into the muscle every 30 (thirty) days. 8th of month 10/31/15   Historical Provider, MD  docusate sodium (COLACE) 100 MG capsule Take 1 capsule (100 mg total) by mouth 2 (two) times daily. 01/24/17   Vaughan Basta, MD  DULoxetine (CYMBALTA) 30 MG capsule Take 1 capsule (30 mg total) by mouth daily. 01/25/17   Vaughan Basta, MD   glucose blood (ACCU-CHEK COMPACT PLUS) test strip  12/07/15   Historical Provider, MD  HYDROcodone-acetaminophen (NORCO) 10-325 MG tablet Take 1 tablet by mouth every 6 (six) hours as needed for moderate pain. 01/24/17   Vaughan Basta, MD  Insulin Pen Needle (B-D ULTRAFINE III SHORT PEN) 31G X 8 MM MISC USE AS DIRECTED 12/24/16   Historical Provider, MD  mirtazapine (REMERON) 15 MG tablet Take 1 tablet (15 mg total) by mouth at bedtime. Patient not taking: Reported on 02/07/2017 10/22/16   Gonzella Lex, MD  mirtazapine (REMERON) 30 MG tablet Take 1 tablet (30 mg total) by mouth at bedtime. 01/24/17   Vaughan Basta, MD  omeprazole (PRILOSEC) 20 MG capsule Take 20 mg by mouth daily.  03/09/16 03/09/17  Historical Provider, MD  ondansetron (ZOFRAN-ODT) 4 MG disintegrating tablet Take 4 mg by mouth every 8 (eight) hours as needed for nausea or vomiting.     Historical Provider, MD  potassium chloride (K-DUR) 10 MEQ tablet Take 10 mEq by mouth daily.    Historical Provider, MD  primidone (MYSOLINE) 50 MG tablet Take 50 mg by mouth 2 (two) times daily.     Historical Provider, MD  ranitidine (ZANTAC) 150 MG tablet Take 300 mg by mouth every evening.     Historical Provider, MD  sitaGLIPtin (JANUVIA) 25 MG tablet Take 25 mg by mouth daily.     Historical Provider, MD  tamsulosin (FLOMAX) 0.4 MG CAPS capsule Take 1 capsule (0.4 mg total) by mouth daily. 01/07/17   Nori Riis, PA-C  torsemide (DEMADEX) 20 MG tablet Take 20 mg by mouth 2 (two) times daily.    Historical Provider, MD  traZODone (DESYREL) 50 MG tablet Take 50 mg by mouth at bedtime.    Historical Provider, MD  Wound Dressings (ALLANTOIN) gel Apply topically as needed for wound care.    Historical Provider, MD    Allergies  Allergen Reactions  . Demerol [Meperidine] Nausea And Vomiting and Nausea Only    Other reaction(s): Nausea And Vomiting, Vomiting Nausea/vomiting    Family History  Problem Relation Age of Onset  .  Hypertension Father   . Stroke Father   . Diabetes Mother   . Prostate cancer Neg Hx   . Kidney cancer Neg Hx   . Kidney disease Neg Hx     Social History Social History  Substance Use Topics  . Smoking status: Former Smoker    Types: Pipe    Quit date: 10/29/2010  . Smokeless tobacco: Never Used  . Alcohol use Yes     Comment: 1 beer a day    Review of Systems Constitutional: Negative for fever Cardiovascular: Negative for chest pain. Respiratory: Negative for shortness of breath. Gastrointestinal: Negative for abdominal pain, vomiting and diarrhea. Genitourinary: Negative for dysuria.Negative for hematuria. Positive for testicular pain. Musculoskeletal: Negative for back pain. Positive for bilateral foot pain which is chronic. Neurological: Negative for headache 10-point ROS otherwise negative.  ____________________________________________   PHYSICAL EXAM:  VITAL SIGNS: ED Triage Vitals  Enc Vitals Group  BP 02/20/17 0716 (!) 179/68     Pulse Rate 02/20/17 0716 65     Resp 02/20/17 0716 18     Temp 02/20/17 0716 99.3 F (37.4 C)     Temp Source 02/20/17 0716 Oral     SpO2 02/20/17 0716 96 %     Weight 02/20/17 0716 163 lb (73.9 kg)     Height 02/20/17 0716 6\' 1"  (1.854 m)     Head Circumference --      Peak Flow --      Pain Score 02/20/17 0717 10     Pain Loc --      Pain Edu? --      Excl. in [place]? --     Constitutional: Alert. Well appearing and in no distress.Answers questions accurately, follows all commands appropriately. Eyes: Normal exam ENT   Head: Normocephalic and atraumatic.   Mouth/Throat: Mucous membranes are moist. Cardiovascular: Normal rate, regular rhythm. No murmur Respiratory: Normal respiratory effort without tachypnea nor retractions Gastrointestinal: Soft and nontender. No distention.  There is no CVA tenderness. Genitourinary: Normal testicular and penile exam. No swelling noted. No erythema or skin color changes.  Nontender to palpation. No signs of hernia mass. Normal perineum. Musculoskeletal: Nontender with normal range of motion in all extremities. No lower extremity tenderness or edema. Warm extremities with intact sensation and good movement bilaterally. Neurologic:  Normal speech and language. No gross focal neurologic deficits Skin:  Skin is warm, dry and intact.  Psychiatric: Mood and affect are normal.  ____________________________________________     RADIOLOGY   CLINICAL DATA: Bilateral testicular pain  EXAM: SCROTAL ULTRASOUND  DOPPLER ULTRASOUND OF THE TESTICLES  TECHNIQUE: Complete ultrasound examination of the testicles, epididymis, and other scrotal structures was performed. Color and spectral Doppler ultrasound were also utilized to evaluate blood flow to the testicles.  COMPARISON: None.  FINDINGS: Right testicle  Measurements: 2.9 x 1.8 x 2.3 cm. No mass or microlithiasis visualized.  Left testicle  Measurements: 2.9 x 2.0 x 2.0 cm. No mass or microlithiasis visualized.  Right epididymis: Normal in size and appearance.  Left epididymis: Normal in size and appearance.  Hydrocele: Moderate bilateral  Varicocele: None visualized.  Pulsed Doppler interrogation of both testes demonstrates normal low resistance arterial and venous waveforms bilaterally.  IMPRESSION: No evidence of testicular mass or torsion.  Moderate bilateral hydroceles.       ____________________________________________   INITIAL IMPRESSION / ASSESSMENT AND PLAN / ED COURSE  Pertinent labs & imaging results that were available during my care of the patient were reviewed by me and considered in my medical decision making (see chart for details).  The patient presents the emergency department foot pain which is chronic and with testicular pain which started this morning. Patient's GU exam is normal, no abnormalities identified. Patient cannot tell me exactly where the pain  is. He is in no distress with no apparent discomfort at this time however he is asking for pain medication. Patient took hydrocodone this morning which she is prescribed chronically which did not help his pain. We will obtain an ultrasound of the testicles further evaluate as well as blood work and urinalysis.  Patient's urinalysis consistent with significant urinary tract infection. Patient's blood work is largely at his baseline including chronic renal insufficiency. Patient continues to have occasional pain which now appears to be more consistent with suprapubic pain and then testicular pain. Bladder scan shows greater than 750 cc in the bladder. A Foley catheter has been placed with  complete resolution of discomfort. Patient will be discharged with a Foley catheter in place, leg bag, antibiotics and primary care follow-up in 2 days for recheck. A urine culture has been sent.  ____________________________________________   FINAL CLINICAL IMPRESSION(S) / ED DIAGNOSES  Urinary tract infection  Acute urinary retention   Harvest Dark, MD 02/20/17 1014

## 2017-02-20 NOTE — ED Triage Notes (Signed)
Pt presents to ED from [facility] by EMS with c/o testicular pain and bilateral foot pain. Pt states his pain increases in his feet when his feet come off the ground while walking. Pt took hydrocodone at home last night and again this morning with no relief.

## 2017-02-20 NOTE — ED Notes (Signed)
Bladder scan [phone] 

## 2017-02-20 NOTE — ED Notes (Signed)
ED Provider at bedside. 

## 2017-02-20 NOTE — Discharge Instructions (Signed)
Please take your antibiotics as prescribed for their entire course. Please follow-up with her primary care doctor in 2 days for recheck/reevaluation. The Foley catheter should be removed in approximately 7-10 days. Return to the emergency department for any fever, increased pain, if you are unable to keep down your antibiotics due to vomiting, or any other symptom personally concerning to yourself.

## 2017-02-22 ENCOUNTER — Inpatient Hospital Stay
Admission: EM | Admit: 2017-02-22 | Discharge: 2017-02-24 | DRG: 728 | Disposition: A | Discharge status: Home Health | Payer: Medicare Other | Attending: Specialist | Admitting: Specialist

## 2017-02-22 ENCOUNTER — Encounter: Payer: Self-pay | Admitting: Emergency Medicine

## 2017-02-22 DIAGNOSIS — Z8551 Personal history of malignant neoplasm of bladder: Secondary | ICD-10-CM | POA: Diagnosis not present

## 2017-02-22 DIAGNOSIS — Z66 Do not resuscitate: Secondary | ICD-10-CM | POA: Diagnosis present

## 2017-02-22 DIAGNOSIS — R251 Tremor, unspecified: Secondary | ICD-10-CM | POA: Diagnosis present

## 2017-02-22 DIAGNOSIS — R197 Diarrhea, unspecified: Secondary | ICD-10-CM | POA: Diagnosis not present

## 2017-02-22 DIAGNOSIS — Z8546 Personal history of malignant neoplasm of prostate: Secondary | ICD-10-CM | POA: Diagnosis not present

## 2017-02-22 DIAGNOSIS — I341 Nonrheumatic mitral (valve) prolapse: Secondary | ICD-10-CM | POA: Diagnosis present

## 2017-02-22 DIAGNOSIS — N183 Chronic kidney disease, stage 3 (moderate): Secondary | ICD-10-CM | POA: Diagnosis present

## 2017-02-22 DIAGNOSIS — N179 Acute kidney failure, unspecified: Secondary | ICD-10-CM

## 2017-02-22 DIAGNOSIS — N410 Acute prostatitis: Secondary | ICD-10-CM | POA: Diagnosis present

## 2017-02-22 DIAGNOSIS — I4891 Unspecified atrial fibrillation: Secondary | ICD-10-CM | POA: Diagnosis present

## 2017-02-22 DIAGNOSIS — I129 Hypertensive chronic kidney disease with stage 1 through stage 4 chronic kidney disease, or unspecified chronic kidney disease: Secondary | ICD-10-CM | POA: Diagnosis present

## 2017-02-22 DIAGNOSIS — Z86010 Personal history of colonic polyps: Secondary | ICD-10-CM

## 2017-02-22 DIAGNOSIS — Z87891 Personal history of nicotine dependence: Secondary | ICD-10-CM

## 2017-02-22 DIAGNOSIS — Z923 Personal history of irradiation: Secondary | ICD-10-CM

## 2017-02-22 DIAGNOSIS — R001 Bradycardia, unspecified: Secondary | ICD-10-CM | POA: Diagnosis present

## 2017-02-22 DIAGNOSIS — N3001 Acute cystitis with hematuria: Secondary | ICD-10-CM | POA: Diagnosis present

## 2017-02-22 DIAGNOSIS — E7800 Pure hypercholesterolemia, unspecified: Secondary | ICD-10-CM | POA: Diagnosis present

## 2017-02-22 DIAGNOSIS — Z8673 Personal history of transient ischemic attack (TIA), and cerebral infarction without residual deficits: Secondary | ICD-10-CM

## 2017-02-22 DIAGNOSIS — N451 Epididymitis: Secondary | ICD-10-CM | POA: Diagnosis present

## 2017-02-22 DIAGNOSIS — E1122 Type 2 diabetes mellitus with diabetic chronic kidney disease: Secondary | ICD-10-CM | POA: Diagnosis present

## 2017-02-22 DIAGNOSIS — M109 Gout, unspecified: Secondary | ICD-10-CM | POA: Diagnosis present

## 2017-02-22 DIAGNOSIS — B9620 Unspecified Escherichia coli [E. coli] as the cause of diseases classified elsewhere: Secondary | ICD-10-CM | POA: Diagnosis present

## 2017-02-22 DIAGNOSIS — Z885 Allergy status to narcotic agent status: Secondary | ICD-10-CM

## 2017-02-22 DIAGNOSIS — R338 Other retention of urine: Secondary | ICD-10-CM | POA: Diagnosis not present

## 2017-02-22 DIAGNOSIS — N50819 Testicular pain, unspecified: Secondary | ICD-10-CM | POA: Diagnosis present

## 2017-02-22 DIAGNOSIS — I454 Nonspecific intraventricular block: Secondary | ICD-10-CM | POA: Diagnosis present

## 2017-02-22 DIAGNOSIS — E119 Type 2 diabetes mellitus without complications: Secondary | ICD-10-CM

## 2017-02-22 DIAGNOSIS — R310 Gross hematuria: Secondary | ICD-10-CM

## 2017-02-22 DIAGNOSIS — Z9221 Personal history of antineoplastic chemotherapy: Secondary | ICD-10-CM

## 2017-02-22 DIAGNOSIS — K219 Gastro-esophageal reflux disease without esophagitis: Secondary | ICD-10-CM | POA: Diagnosis present

## 2017-02-22 DIAGNOSIS — Z7901 Long term (current) use of anticoagulants: Secondary | ICD-10-CM

## 2017-02-22 DIAGNOSIS — Z8249 Family history of ischemic heart disease and other diseases of the circulatory system: Secondary | ICD-10-CM

## 2017-02-22 DIAGNOSIS — Z79899 Other long term (current) drug therapy: Secondary | ICD-10-CM

## 2017-02-22 DIAGNOSIS — Z833 Family history of diabetes mellitus: Secondary | ICD-10-CM

## 2017-02-22 DIAGNOSIS — E785 Hyperlipidemia, unspecified: Secondary | ICD-10-CM | POA: Diagnosis present

## 2017-02-22 DIAGNOSIS — Z823 Family history of stroke: Secondary | ICD-10-CM

## 2017-02-22 LAB — CBC WITH DIFFERENTIAL/PLATELET
BASOS PCT: 0 %
Basophils Absolute: 0 10*3/uL (ref 0–0.1)
EOS ABS: 0.1 10*3/uL (ref 0–0.7)
Eosinophils Relative: 1 %
HCT: 30.9 % — ABNORMAL LOW (ref 40.0–52.0)
HEMOGLOBIN: 10.2 g/dL — AB (ref 13.0–18.0)
Lymphocytes Relative: 4 %
Lymphs Abs: 0.4 10*3/uL — ABNORMAL LOW (ref 1.0–3.6)
MCH: 29 pg (ref 26.0–34.0)
MCHC: 32.9 g/dL (ref 32.0–36.0)
MCV: 88 fL (ref 80.0–100.0)
Monocytes Absolute: 0.8 10*3/uL (ref 0.2–1.0)
Monocytes Relative: 10 %
Neutro Abs: 7.2 10*3/uL — ABNORMAL HIGH (ref 1.4–6.5)
Neutrophils Relative %: 85 %
Platelets: 225 10*3/uL (ref 150–440)
RBC: 3.52 MIL/uL — ABNORMAL LOW (ref 4.40–5.90)
RDW: 16 % — ABNORMAL HIGH (ref 11.5–14.5)
WBC: 8.5 10*3/uL (ref 3.8–10.6)

## 2017-02-22 LAB — BASIC METABOLIC PANEL
Anion gap: 8 (ref 5–15)
BUN: 45 mg/dL — ABNORMAL HIGH (ref 6–20)
CALCIUM: 8.3 mg/dL — AB (ref 8.9–10.3)
CO2: 29 mmol/L (ref 22–32)
CREATININE: 2.48 mg/dL — AB (ref 0.61–1.24)
Chloride: 102 mmol/L (ref 101–111)
GFR, EST AFRICAN AMERICAN: 25 mL/min — AB (ref >60)
GFR, EST NON AFRICAN AMERICAN: 22 mL/min — AB (ref >60)
Glucose, Bld: 180 mg/dL — ABNORMAL HIGH (ref 65–99)
Potassium: 4.7 mmol/L (ref 3.5–5.1)
SODIUM: 139 mmol/L (ref 135–145)

## 2017-02-22 LAB — GLUCOSE, CAPILLARY
GLUCOSE-CAPILLARY: 177 mg/dL — AB (ref 65–99)
Glucose-Capillary: 157 mg/dL — ABNORMAL HIGH (ref 65–99)

## 2017-02-22 LAB — PROTIME-INR
INR: 1
PROTHROMBIN TIME: 13.2 s (ref 11.4–15.2)

## 2017-02-22 LAB — URINE CULTURE: Culture: >=100000 — AB

## 2017-02-22 LAB — APTT: aPTT: 31 seconds (ref 24–36)

## 2017-02-22 MED ORDER — OXYCODONE HCL 5 MG PO TABS
5.0000 mg | ORAL_TABLET | ORAL | Status: DC | PRN
  Administered 2017-02-24: 02:00:00 5 mg via ORAL
  Filled 2017-02-22: qty 1

## 2017-02-22 MED ORDER — LEVOFLOXACIN IN D5W 750 MG/150ML IV SOLN: 750.0000 mg | INTRAVENOUS | Status: DC

## 2017-02-22 MED ORDER — DOCUSATE SODIUM 100 MG PO CAPS
100.0000 mg | ORAL_CAPSULE | Freq: Two times a day (BID) | ORAL | Status: DC
  Administered 2017-02-23: 08:00:00 100 mg via ORAL
  Filled 2017-02-22: qty 1

## 2017-02-22 MED ORDER — ACETAMINOPHEN 650 MG RE SUPP: 650.0000 mg | Freq: Four times a day (QID) | RECTAL | Status: DC | PRN

## 2017-02-22 MED ORDER — ONDANSETRON 4 MG PO TBDP
4.0000 mg | ORAL_TABLET | Freq: Three times a day (TID) | ORAL | Status: DC | PRN
  Filled 2017-02-22: qty 1

## 2017-02-22 MED ORDER — DULOXETINE HCL 30 MG PO CPEP
30.0000 mg | ORAL_CAPSULE | Freq: Every day | ORAL | Status: DC
  Administered 2017-02-23 – 2017-02-24 (×2): 30 mg via ORAL
  Filled 2017-02-22 (×2): qty 1

## 2017-02-22 MED ORDER — MIRTAZAPINE 15 MG PO TABS
30.0000 mg | ORAL_TABLET | Freq: Every day | ORAL | Status: DC
  Administered 2017-02-22 – 2017-02-23 (×2): 30 mg via ORAL
  Filled 2017-02-22 (×2): qty 2

## 2017-02-22 MED ORDER — INSULIN ASPART 100 UNIT/ML ~~LOC~~ SOLN
0.0000 [IU] | Freq: Three times a day (TID) | SUBCUTANEOUS | Status: DC
Start: 2017-02-22 — End: 2017-02-24
  Administered 2017-02-22: 18:00:00 2 [IU] via SUBCUTANEOUS
  Administered 2017-02-23: 12:00:00 1 [IU] via SUBCUTANEOUS
  Filled 2017-02-22: qty 1
  Filled 2017-02-22: qty 2

## 2017-02-22 MED ORDER — AMLODIPINE BESYLATE 5 MG PO TABS
5.0000 mg | ORAL_TABLET | Freq: Every day | ORAL | Status: DC
  Administered 2017-02-23 – 2017-02-24 (×2): 5 mg via ORAL
  Filled 2017-02-22 (×2): qty 1

## 2017-02-22 MED ORDER — FAMOTIDINE 20 MG PO TABS
20.0000 mg | ORAL_TABLET | Freq: Every day | ORAL | Status: DC
  Administered 2017-02-22 – 2017-02-23 (×2): 20 mg via ORAL
  Filled 2017-02-22 (×2): qty 1

## 2017-02-22 MED ORDER — LEVOFLOXACIN IN D5W 750 MG/150ML IV SOLN
750.0000 mg | Freq: Once | INTRAVENOUS | Status: AC
  Administered 2017-02-22: 750 mg via INTRAVENOUS
  Filled 2017-02-22: qty 150

## 2017-02-22 MED ORDER — TAMSULOSIN HCL 0.4 MG PO CAPS
ORAL_CAPSULE | ORAL | Status: AC
  Filled 2017-02-22: qty 1

## 2017-02-22 MED ORDER — PANTOPRAZOLE SODIUM 40 MG PO TBEC
40.0000 mg | DELAYED_RELEASE_TABLET | Freq: Every day | ORAL | Status: DC
Start: 2017-02-23 — End: 2017-02-24
  Administered 2017-02-23 – 2017-02-24 (×2): 40 mg via ORAL
  Filled 2017-02-22 (×2): qty 1

## 2017-02-22 MED ORDER — DEXTROSE 5 % IV SOLN
1.0000 g | INTRAVENOUS | Status: DC
  Administered 2017-02-23 – 2017-02-24 (×2): 1 g via INTRAVENOUS
  Filled 2017-02-22 (×2): qty 10

## 2017-02-22 MED ORDER — INSULIN ASPART 100 UNIT/ML ~~LOC~~ SOLN: 0.0000 [IU] | Freq: Every day | SUBCUTANEOUS | Status: DC

## 2017-02-22 MED ORDER — PRIMIDONE 50 MG PO TABS
50.0000 mg | ORAL_TABLET | Freq: Two times a day (BID) | ORAL | Status: DC
  Administered 2017-02-22 – 2017-02-24 (×4): 50 mg via ORAL
  Filled 2017-02-22 (×4): qty 1

## 2017-02-22 MED ORDER — ALLOPURINOL 100 MG PO TABS
100.0000 mg | ORAL_TABLET | Freq: Every day | ORAL | Status: DC
  Administered 2017-02-23 – 2017-02-24 (×2): 100 mg via ORAL
  Filled 2017-02-22 (×2): qty 1

## 2017-02-22 MED ORDER — ACETAMINOPHEN 325 MG PO TABS: 650.0000 mg | ORAL_TABLET | Freq: Four times a day (QID) | ORAL | Status: DC | PRN

## 2017-02-22 MED ORDER — TRAZODONE HCL 50 MG PO TABS
50.0000 mg | ORAL_TABLET | Freq: Every day | ORAL | Status: DC
  Administered 2017-02-22 – 2017-02-23 (×2): 50 mg via ORAL
  Filled 2017-02-22 (×2): qty 1

## 2017-02-22 MED ORDER — SODIUM CHLORIDE 0.9 % IV BOLUS (SEPSIS)
1000.0000 mL | Freq: Once | INTRAVENOUS | Status: AC
  Administered 2017-02-22: 1000 mL via INTRAVENOUS

## 2017-02-22 MED ORDER — FENTANYL CITRATE (PF) 100 MCG/2ML IJ SOLN
50.0000 ug | Freq: Once | INTRAMUSCULAR | Status: AC
  Administered 2017-02-22: 50 ug via INTRAVENOUS
  Filled 2017-02-22: qty 2

## 2017-02-22 MED ORDER — TAMSULOSIN HCL 0.4 MG PO CAPS
0.4000 mg | ORAL_CAPSULE | Freq: Every day | ORAL | Status: DC
  Administered 2017-02-23 – 2017-02-24 (×2): 0.4 mg via ORAL
  Filled 2017-02-22 (×2): qty 1

## 2017-02-22 MED ORDER — DOCUSATE SODIUM 100 MG PO CAPS
ORAL_CAPSULE | ORAL | Status: AC
Start: 2017-02-22 — End: 2017-02-23
  Filled 2017-02-22: qty 1

## 2017-02-22 NOTE — H&P (Signed)
[facility] NAME: Dean Garcia    MR#:  [id number]  DATE OF BIRTH:  Jun 03, 1928  DATE OF ADMISSION:  02/22/2017  PRIMARY CARE PHYSICIAN: Kirk Ruths., MD   REQUESTING/REFERRING PHYSICIAN: DR Gonzella Lex  CHIEF COMPLAINT:   Chief Complaint  Patient presents with  . Testicle Pain  . Urinary Tract Infection    HISTORY OF PRESENT ILLNESS:  Dean Garcia  is a 83 y.o. male presents back to the ER with testicle pain. He was in the ER couple days ago and was sent home with pain medications antibiotics and a Foley catheter. They noticed some blood in the Foley catheter in the Eliquis was held. The pain in his testicle had returned 8 out of 10 in intensity. They came back to the ER for further evaluation. The urine culture from the other day is growing Escherichia coli. The ER physician felt that he had a prostatitis.  PAST MEDICAL HISTORY:   Past Medical History:  Diagnosis Date  . Atrial fibrillation ([place])   . Bladder cancer ([place])   . Bundle branch block, right   . Carotid stenosis   . Colon polyp   . CVA (cerebral infarction)   . Diabetes ([place])   . Gout   . High cholesterol   . Hypertension   . Mitral valve prolapse   . Prostate cancer ([place])   . Tremor     PAST SURGICAL HISTORY:   Past Surgical History:  Procedure Laterality Date  . APPENDECTOMY    . CAROTID ARTERY ANGIOPLASTY    . CHOLECYSTECTOMY      SOCIAL HISTORY:   Social History  Substance Use Topics  . Smoking status: Former Smoker    Types: Pipe    Quit date: 10/29/2010  . Smokeless tobacco: Never Used  . Alcohol use Yes     Comment: 1 beer a day    FAMILY HISTORY:   Family History  Problem Relation Age of Onset  . Hypertension Father   . Stroke Father   . Diabetes Mother   . Prostate cancer Neg Hx   . Kidney cancer Neg Hx   . Kidney disease Neg Hx     DRUG ALLERGIES:   Allergies  Allergen Reactions  . Demerol  [Meperidine] Nausea And Vomiting and Nausea Only    Other reaction(s): Nausea And Vomiting, Vomiting Nausea/vomiting    REVIEW OF SYSTEMS:  CONSTITUTIONAL: No fever, fatigue or weakness. Positive for weight loss 20 pounds over the last few months EYES: No blurred or double vision. Wears glasses EARS, NOSE, AND THROAT: No tinnitus or ear pain. No sore throat. Wears hearing aids RESPIRATORY: No cough, occasional shortness of breath. No wheezing or hemoptysis.  CARDIOVASCULAR: No chest pain, orthopnea, edema.  GASTROINTESTINAL: No nausea, vomiting, or abdominal pain. No blood in bowel movements. Positive fecal incontinence with soft stool GENITOURINARY: Blood in the Foley catheter ENDOCRINE: No polyuria, nocturia,  HEMATOLOGY: No anemia, easy bruising or bleeding SKIN: No rash or lesion. MUSCULOSKELETAL: No joint pain or arthritis.  Pain and spasms of his feet NEUROLOGIC: Pain in his feet and spasms in his feet PSYCHIATRY: Positive for anxiety.   MEDICATIONS AT HOME:   Prior to Admission medications   Medication Sig Start Date End Date Taking? Authorizing Provider  allopurinol (ZYLOPRIM) 100 MG tablet Take 100 mg by mouth daily.   Yes Historical Provider, MD  amLODipine (NORVASC) 5 MG tablet Take 1 tablet (5 mg total) by mouth daily.  01/25/17  Yes Vaughan Basta, MD  cephALEXin (KEFLEX) 250 MG capsule Take 1 capsule (250 mg total) by mouth 2 (two) times daily. 02/20/17  Yes Harvest Dark, MD  cyanocobalamin (,VITAMIN B-12,) 1000 MCG/ML injection Inject 1,000 mcg into the muscle every 30 (thirty) days. 8th of month 10/31/15  Yes Historical Provider, MD  docusate sodium (COLACE) 100 MG capsule Take 1 capsule (100 mg total) by mouth 2 (two) times daily. 01/24/17  Yes Vaughan Basta, MD  DULoxetine (CYMBALTA) 30 MG capsule Take 1 capsule (30 mg total) by mouth daily. 01/25/17  Yes Vaughan Basta, MD  HYDROcodone-acetaminophen (NORCO) 10-325 MG tablet Take 1 tablet by mouth  every 6 (six) hours as needed for moderate pain. 01/24/17  Yes Vaughan Basta, MD  mirtazapine (REMERON) 30 MG tablet Take 1 tablet (30 mg total) by mouth at bedtime. 01/24/17  Yes Vaughan Basta, MD  omeprazole (PRILOSEC) 20 MG capsule Take 20 mg by mouth daily.  03/09/16 03/09/17 Yes Historical Provider, MD  ondansetron (ZOFRAN-ODT) 4 MG disintegrating tablet Take 4 mg by mouth every 8 (eight) hours as needed for nausea or vomiting.    Yes Historical Provider, MD  potassium chloride (K-DUR) 10 MEQ tablet Take 10 mEq by mouth daily.   Yes Historical Provider, MD  primidone (MYSOLINE) 50 MG tablet Take 50 mg by mouth 2 (two) times daily.    Yes Historical Provider, MD  ranitidine (ZANTAC) 150 MG tablet Take 300 mg by mouth every evening.    Yes Historical Provider, MD  sitaGLIPtin (JANUVIA) 25 MG tablet Take 25 mg by mouth daily.    Yes Historical Provider, MD  tamsulosin (FLOMAX) 0.4 MG CAPS capsule Take 1 capsule (0.4 mg total) by mouth daily. 01/07/17  Yes Shannon A McGowan, PA-C  torsemide (DEMADEX) 20 MG tablet Take 20 mg by mouth 2 (two) times daily.   Yes Historical Provider, MD  traZODone (DESYREL) 50 MG tablet Take 50 mg by mouth at bedtime.   Yes Historical Provider, MD  apixaban (ELIQUIS) 2.5 MG TABS tablet Take 2.5 mg by mouth 2 (two) times daily.  02/21/16   Historical Provider, MD  glucose blood (ACCU-CHEK COMPACT PLUS) test strip  12/07/15   Historical Provider, MD  Insulin Pen Needle (B-D ULTRAFINE III SHORT PEN) 31G X 8 MM MISC USE AS DIRECTED 12/24/16   Historical Provider, MD  Wound Dressings (ALLANTOIN) gel Apply topically as needed for wound care.    Historical Provider, MD      VITAL SIGNS:  Blood pressure (!) 164/64, pulse (!) 50, temperature 97.5 F (36.4 C), temperature source Oral, resp. rate 16, height 6\' 1"  (1.854 m), weight 73.9 kg (163 lb), SpO2 100 %.  PHYSICAL EXAMINATION:  GENERAL:  83 y.o.-year-old patient lying in the bed with no acute distress.  EYES:  Pupils equal, round, reactive to light and accommodation. No scleral icterus. Extraocular muscles intact.  HEENT: Head atraumatic, normocephalic. Oropharynx and nasopharynx clear.  NECK:  Supple, no jugular venous distention. No thyroid enlargement, no tenderness.  LUNGS: Normal breath sounds bilaterally, no wheezing, rales,rhonchi or crepitation. No use of accessory muscles of respiration.  CARDIOVASCULAR: S1, S2 normal. No murmurs, rubs, or gallops.  ABDOMEN: Soft, nontender, nondistended. Bowel sounds present. No organomegaly or mass.  Genitourinary: Epididymis painful on the right testicle. Left testicle atrophied EXTREMITIES: Trace edema, no cyanosis, or clubbing.  NEUROLOGIC: Cranial nerves II through XII are intact. Muscle strength 5/5 in all extremities. Sensation intact. Gait not checked.  PSYCHIATRIC: The patient is alert  and oriented x 3.  SKIN: No rash, lesion, or ulcer.   LABORATORY PANEL:   CBC  Recent Labs Lab 02/22/17 1325  WBC 8.5  HGB 10.2*  HCT 30.9*  PLT 225   ------------------------------------------------------------------------------------------------------------------  Chemistries   Recent Labs Lab 02/22/17 1325  NA 139  K 4.7  CL 102  CO2 29  GLUCOSE 180*  BUN 45*  CREATININE 2.48*  CALCIUM 8.3*   ------------------------------------------------------------------------------------------------------------------    IMPRESSION AND PLAN:   1. Epididymitis with acute cystitis with hematuria secondary to Escherichia coli. ER physician thought there may be a prostatitis and spoke with urology. Switch antibiotics over to Rocephin which would cover all 3. 2. Urinary retention status post Foley catheter placement. On Flomax. Likely will need the Foley catheter in for week. 3. Acute kidney injury on chronic kidney disease stage 4. Gentle IV fluid hydration 4. History of atrial fibrillation, bradycardia. With eliquis on hold stroke risk is higher. 5.  History of gout on allopurinol 6. Type 2 diabetes mellitus hold Januvia and put on sliding scale 7. GERD on PPI 8. Tremor on Mysoline 9. Essential hypertension continue usual medications 10. Weakness we'll get physical therapy evaluation 11. Hyperlipidemia unspecified statin on hold   All the records are reviewed and case discussed with ED provider. Management plans discussed with the patient, family and they are in agreement.  CODE STATUS: DO NOT RESUSCITATE  TOTAL TIME TAKING CARE OF THIS PATIENT: 50 minutes.    Loletha Grayer M.D on 02/22/2017 at 3:37 PM  Between 7am to 6pm - Pager - [phone]  After 6pm call admission pager [phone]  [facility]  [phone]  CC: Primary care physician; Kirk Ruths., MD

## 2017-02-22 NOTE — ED Provider Notes (Signed)
[facility] Emergency Department Provider Note  ____________________________________________  Time seen: Approximately 1:57 PM  I have reviewed the triage vital signs and the nursing notes.   HISTORY  Chief Complaint Testicle Pain and Urinary Tract Infection   HPI Dean Garcia is a 83 y.o. male with a history of atrial of fibrillation on Eliquis, diabetes, prostate cancer and bladder cancer status post radiation and chemotherapy, and hypertension who presents for evaluation of scrotum pain. Patient was seen here 2 days ago for similar complaint with a negative ultrasound. He was found to have a urinary tract infection and urinary retention. He was started on Keflex and a Foley catheter was placed. Yesterday patient noticed gross hematuria in his bag which has been constant up to today. He spoke with his PCP yesterday who told him to hold his Eliquis which patient did.  patient reports that the pain started again today which is sever, located between his rectum and his testicles, sharp, constant, non radiating. NO abdominal Pain, flank pain, nausea, vomiting, chills, fever, dizziness.  Past Medical History:  Diagnosis Date  . Atrial fibrillation ([place])   . Bladder cancer ([place])   . Bundle branch block, right   . Carotid stenosis   . Colon polyp   . CVA (cerebral infarction)   . Diabetes ([place])   . Gout   . High cholesterol   . Hypertension   . Mitral valve prolapse   . Prostate cancer ([place])   . Tremor     Patient Active Problem List   Diagnosis Date Noted  . Pain in limb 02/07/2017  . Hypoglycemia 01/19/2017  . Depression 01/19/2017  . Hypoglycemia due to insulin 01/19/2017  . Muscle weakness (generalized)   . SOB (shortness of breath)   . Palliative care by specialist   . HCAP (healthcare-associated pneumonia) 11/29/2016  . Pressure injury of skin 11/20/2016  . Palliative care encounter   . Goals of care, counseling/discussion   . Acute renal  failure superimposed on chronic kidney disease ([place])   . ARF (acute renal failure) ([place]) 11/19/2016  . Moderate major depression, single episode ([place]) 10/22/2016  . Mild dementia 10/22/2016  . Paroxysmal atrial fibrillation ([place]) 02/21/2016  . A-fib ([place]) 02/12/2016  . Centrilobular emphysema ([place]) 12/27/2015  . B12 deficiency 04/12/2015  . Atherosclerosis of aorta ([place]) 10/23/2014  . 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 Has a tremor 07/17/2014  . Type 2 diabetes mellitus ([place]) 07/17/2014  . Edema 07/17/2014  . Billowing mitral valve 07/12/2014  . BP (high blood pressure) 07/12/2014  . Carotid artery narrowing 07/12/2014  . Impaired renal function 06/07/2014  . Proctitis, radiation 06/07/2014  . Malignant neoplasm of prostate ([place]) 06/07/2014  . Peripheral nerve disease ([place]) 06/07/2014  . DJD (degenerative joint disease) 06/07/2014  . Disorder of peripheral nervous system ([place]) 06/07/2014  . Gout 06/07/2014  . 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 DD (diverticular disease) 06/07/2014  . Disease of rectum 06/07/2014  . Diabetes mellitus ([place]) 06/07/2014  . Malignant neoplasm of urinary bladder ([place]) 06/07/2014    Past Surgical History:  Procedure Laterality Date  . APPENDECTOMY    . CAROTID ARTERY ANGIOPLASTY    . CHOLECYSTECTOMY      Prior to Admission medications   Medication Sig Start Date End Date Taking? Authorizing Provider  allopurinol (ZYLOPRIM) 100 MG tablet Take 100 mg by mouth daily.    Historical Provider, MD  amLODipine (NORVASC) 5 MG tablet Take 1 tablet (5 mg total) by mouth  daily. 01/25/17   Vaughan Basta, MD  apixaban (ELIQUIS) 2.5 MG TABS tablet Take 2.5 mg by mouth 2 (two) times daily.  02/21/16   Historical Provider, MD  cephALEXin (KEFLEX) 250 MG capsule Take 1 capsule (250 mg total) by mouth 2 (two) times daily. 02/20/17   Harvest Dark, MD  cyanocobalamin (,VITAMIN B-12,) 1000 MCG/ML injection Inject 1,000 mcg into the  muscle every 30 (thirty) days. 8th of month 10/31/15   Historical Provider, MD  docusate sodium (COLACE) 100 MG capsule Take 1 capsule (100 mg total) by mouth 2 (two) times daily. 01/24/17   Vaughan Basta, MD  DULoxetine (CYMBALTA) 30 MG capsule Take 1 capsule (30 mg total) by mouth daily. 01/25/17   Vaughan Basta, MD  glucose blood (ACCU-CHEK COMPACT PLUS) test strip  12/07/15   Historical Provider, MD  HYDROcodone-acetaminophen (NORCO) 10-325 MG tablet Take 1 tablet by mouth every 6 (six) hours as needed for moderate pain. 01/24/17   Vaughan Basta, MD  Insulin Pen Needle (B-D ULTRAFINE III SHORT PEN) 31G X 8 MM MISC USE AS DIRECTED 12/24/16   Historical Provider, MD  mirtazapine (REMERON) 15 MG tablet Take 1 tablet (15 mg total) by mouth at bedtime. Patient not taking: Reported on 02/07/2017 10/22/16   Gonzella Lex, MD  mirtazapine (REMERON) 30 MG tablet Take 1 tablet (30 mg total) by mouth at bedtime. 01/24/17   Vaughan Basta, MD  omeprazole (PRILOSEC) 20 MG capsule Take 20 mg by mouth daily.  03/09/16 03/09/17  Historical Provider, MD  ondansetron (ZOFRAN-ODT) 4 MG disintegrating tablet Take 4 mg by mouth every 8 (eight) hours as needed for nausea or vomiting.     Historical Provider, MD  potassium chloride (K-DUR) 10 MEQ tablet Take 10 mEq by mouth daily.    Historical Provider, MD  primidone (MYSOLINE) 50 MG tablet Take 50 mg by mouth 2 (two) times daily.     Historical Provider, MD  ranitidine (ZANTAC) 150 MG tablet Take 300 mg by mouth every evening.     Historical Provider, MD  sitaGLIPtin (JANUVIA) 25 MG tablet Take 25 mg by mouth daily.     Historical Provider, MD  tamsulosin (FLOMAX) 0.4 MG CAPS capsule Take 1 capsule (0.4 mg total) by mouth daily. 01/07/17   Nori Riis, PA-C  torsemide (DEMADEX) 20 MG tablet Take 20 mg by mouth 2 (two) times daily.    Historical Provider, MD  traZODone (DESYREL) 50 MG tablet Take 50 mg by mouth at bedtime.    Historical  Provider, MD  Wound Dressings (ALLANTOIN) gel Apply topically as needed for wound care.    Historical Provider, MD    Allergies Demerol [meperidine]  Family History  Problem Relation Age of Onset  . Hypertension Father   . Stroke Father   . Diabetes Mother   . Prostate cancer Neg Hx   . Kidney cancer Neg Hx   . Kidney disease Neg Hx     Social History Social History  Substance Use Topics  . Smoking status: Former Smoker    Types: Pipe    Quit date: 10/29/2010  . Smokeless tobacco: Never Used  . Alcohol use Yes     Comment: 1 beer a day    Review of Systems  Constitutional: Negative for fever. Eyes: Negative for visual changes. ENT: Negative for sore throat. Neck: No neck pain  Cardiovascular: Negative for chest pain. Respiratory: Negative for shortness of breath. Gastrointestinal: Negative for abdominal pain, vomiting or diarrhea. + pain behind scrotum  Genitourinary: Negative for dysuria. + hematuria Musculoskeletal: Negative for back pain. Skin: Negative for rash. Neurological: Negative for headaches, weakness or numbness. Psych: No SI or HI  ____________________________________________   PHYSICAL EXAM:  VITAL SIGNS: ED Triage Vitals  Enc Vitals Group     BP 02/22/17 1221 (!) 172/81     Pulse Rate 02/22/17 1221 65     Resp 02/22/17 1221 (!) 22     Temp 02/22/17 1233 97.5 F (36.4 C)     Temp Source 02/22/17 1233 Oral     SpO2 02/22/17 1221 100 %     Weight 02/22/17 1222 163 lb (73.9 kg)     Height 02/22/17 1222 6\' 1"  (1.854 m)     Head Circumference --      Peak Flow --      Pain Score 02/22/17 1222 10     Pain Loc --      Pain Edu? --      Excl. in [place]? --     Constitutional: Alert and oriented. Well appearing and in no apparent distress. HEENT:      Head: Normocephalic and atraumatic.         Eyes: Conjunctivae are normal. Sclera is non-icteric. EOMI. PERRL      Mouth/Throat: Mucous membranes are moist.       Neck: Supple with no signs of  meningismus. Cardiovascular: Regular rate and rhythm. No murmurs, gallops, or rubs. 2+ symmetrical distal pulses are present in all extremities. No JVD. Respiratory: Normal respiratory effort. Lungs are clear to auscultation bilaterally. No wheezes, crackles, or rhonchi.  Gastrointestinal: Soft, non tender, and non distended with positive bowel sounds. No rebound or guarding. Genitourinary: No CVA tenderness. Significant ttp with palpation of his prostate. Foley bag with gross hematuria. Bilateral testicles are descended with no tenderness to palpation, bilateral positive cremasteric reflexes are present, no swelling or erythema of the scrotum. No evidence of inguinal hernia. Musculoskeletal: Nontender with normal range of motion in all extremities. No edema, cyanosis, or erythema of extremities. Neurologic: Normal speech and language. Face is symmetric. Moving all extremities. No gross focal neurologic deficits are appreciated. Skin: Skin is warm, dry and intact. No rash noted. Psychiatric: Mood and affect are normal. Speech and behavior are normal.  ____________________________________________   LABS (all labs ordered are listed, but only abnormal results are displayed)  Labs Reviewed  CBC WITH DIFFERENTIAL/PLATELET - Abnormal; Notable for the following:       Result Value   RBC 3.52 (*)    Hemoglobin 10.2 (*)    HCT 30.9 (*)    RDW 16.0 (*)    Neutro Abs 7.2 (*)    Lymphs Abs 0.4 (*)    All other components within normal limits  BASIC METABOLIC PANEL - Abnormal; Notable for the following:    Glucose, Bld 180 (*)    BUN 45 (*)    Creatinine, Ser 2.48 (*)    Calcium 8.3 (*)    GFR calc non Af Amer 22 (*)    GFR calc Af Amer 25 (*)    All other components within normal limits  PROTIME-INR  APTT   ____________________________________________  EKG  none  ____________________________________________  RADIOLOGY  none   ____________________________________________   PROCEDURES  Procedure(s) performed: None Procedures Critical Care performed:  None ____________________________________________   INITIAL IMPRESSION / ASSESSMENT AND PLAN / ED COURSE  83 y.o. male with a history of atrial of fibrillation on Eliquis, diabetes, prostate cancer and bladder cancer status post  radiation and chemotherapy, and hypertension who presents for evaluation of hematuria and pain between his scrotum and rectum. Patient was seen here 2 days ago was diagnosed with urinary retention the setting of a UTI and had a Foley catheter placed. He has been taking Keflex at home. His vital signs are within normal limits and he is well appearing otherwise. Has been holding his anticoagulation since yesterday when the hematuria started. Patient has significant tenderness to palpation on his prostate concerning for prostatitis. Labs showing normal WBC and AKI with creatinine at 2.48 (baseline 1.7). Ucx from 2 days ago growing E.coli sensitive to levaquin/cipro/ceftriaxone. Discussed with Dr. Erlene Quan who recommended inpatient admission. Patient given IV levaquin. Will discuss with hospitalist for admission.      Pertinent labs & imaging results that were available during my care of the patient were reviewed by me and considered in my medical decision making (see chart for details).    ____________________________________________   FINAL CLINICAL IMPRESSION(S) / ED DIAGNOSES  Final diagnoses:  Acute prostatitis  Gross hematuria      NEW MEDICATIONS STARTED DURING THIS VISIT:  New Prescriptions   No medications on file     Note:  This document was prepared using Dragon voice recognition software and may include unintentional dictation errors.    Rudene Re, MD 02/22/17 1407

## 2017-02-22 NOTE — ED Notes (Signed)
This tech transported pt to inpatient room

## 2017-02-22 NOTE — Consult Note (Addendum)
Pharmacy Antibiotic Note  Dean Garcia is a 83 y.o. male admitted on 02/22/2017 with UTI and epididymitis.  Pharmacy has been consulted for ceftriaxone dosing. Patient has been on cephalexin for 2 days for UTI- pt started having hematuria and pain between his scrotum and rectum since yesterday. Ucx from 2 days ago is growing ecoli sensitive to cipro/ceftriaxone/cefazolin.  Plan: Ceftriaxone 1g q 24 hours- will start tomorrow since pt received 1 dose of levofloxacin today which will also cover UTI and epididymis  Height: 6\' 1"  (185.4 cm) Weight: 163 lb (73.9 kg) IBW/kg (Calculated) : 79.9  Temp (24hrs), Avg:97.5 F (36.4 C), Min:97.5 F (36.4 C), Max:97.5 F (36.4 C)   Recent Labs Lab 02/20/17 0726 02/22/17 1325  WBC 9.7 8.5  CREATININE 2.48* 2.48*    Estimated Creatinine Clearance: 22.3 mL/min (A) (by C-G formula based on SCr of 2.48 mg/dL (H)).    Allergies  Allergen Reactions  . Demerol [Meperidine] Nausea And Vomiting and Nausea Only    Other reaction(s): Nausea And Vomiting, Vomiting Nausea/vomiting    Antimicrobials this admission: levofloxacin 3/23 >>3/23 Ceftriaxone 3/24>>    Dose adjustments this admission:   Microbiology results: Recent Results (from the past 240 hour(s))  Urine culture     Status: Abnormal   Collection Time: 02/20/17  7:26 AM  Result Value Ref Range Status   Specimen Description URINE, RANDOM  Final   Special Requests NONE  Final   Culture >=100,000 COLONIES/mL ESCHERICHIA COLI (A)  Final   Report Status 02/22/2017 FINAL  Final   Organism ID, Bacteria ESCHERICHIA COLI (A)  Final      Susceptibility   Escherichia coli - MIC*    AMPICILLIN >=32 RESISTANT Resistant     CEFAZOLIN <=4 SENSITIVE Sensitive     CEFTRIAXONE <=1 SENSITIVE Sensitive     CIPROFLOXACIN <=0.25 SENSITIVE Sensitive     GENTAMICIN <=1 SENSITIVE Sensitive     IMIPENEM <=0.25 SENSITIVE Sensitive     NITROFURANTOIN <=16 SENSITIVE Sensitive     TRIMETH/SULFA >=320  RESISTANT Resistant     AMPICILLIN/SULBACTAM 16 INTERMEDIATE Intermediate     PIP/TAZO <=4 SENSITIVE Sensitive     Extended ESBL NEGATIVE Sensitive     * >=100,000 COLONIES/mL ESCHERICHIA COLI     Thank you for allowing pharmacy to be a part of this patient's care.  Ramond Dial, Pharm.D, BCPS Clinical Pharmacist  02/22/2017 2:53 PM

## 2017-02-22 NOTE — ED Notes (Addendum)
Admitting Provider [place] at bedside.

## 2017-02-22 NOTE — ED Triage Notes (Signed)
Pt presents to ED with c/o pain behind testicles and around catheter.  Patient states the pain started today. Was here on 3/21 and had a catheter placed with a leg bag. Wife states the catheter had blood in it yesterday and was told to stop Eliquis. Last dose of Eliquis was 3/22 in the morning. Takes Eliquis for AFIB

## 2017-02-23 DIAGNOSIS — Z8551 Personal history of malignant neoplasm of bladder: Secondary | ICD-10-CM

## 2017-02-23 DIAGNOSIS — R338 Other retention of urine: Secondary | ICD-10-CM

## 2017-02-23 DIAGNOSIS — N410 Acute prostatitis: Secondary | ICD-10-CM

## 2017-02-23 DIAGNOSIS — Z8546 Personal history of malignant neoplasm of prostate: Secondary | ICD-10-CM

## 2017-02-23 LAB — CREATININE, SERUM
CREATININE: 2.19 mg/dL — AB (ref 0.61–1.24)
GFR calc Af Amer: 30 mL/min — ABNORMAL LOW (ref >60)
GFR calc non Af Amer: 26 mL/min — ABNORMAL LOW (ref >60)

## 2017-02-23 LAB — GLUCOSE, CAPILLARY
GLUCOSE-CAPILLARY: 135 mg/dL — AB (ref 65–99)
GLUCOSE-CAPILLARY: 106 mg/dL — AB (ref 65–99)
GLUCOSE-CAPILLARY: 151 mg/dL — AB (ref 65–99)
Glucose-Capillary: 82 mg/dL (ref 65–99)
Glucose-Capillary: 108 mg/dL — ABNORMAL HIGH (ref 65–99)

## 2017-02-23 MED ORDER — SODIUM CHLORIDE 0.9% FLUSH
3.0000 mL | Freq: Two times a day (BID) | INTRAVENOUS | Status: DC
  Administered 2017-02-23 – 2017-02-24 (×3): 3 mL via INTRAVENOUS

## 2017-02-23 MED ORDER — SODIUM CHLORIDE 0.9% FLUSH
3.0000 mL | INTRAVENOUS | Status: DC | PRN
  Administered 2017-02-23: 3 mL via INTRAVENOUS
  Filled 2017-02-23: qty 3

## 2017-02-23 NOTE — Progress Notes (Signed)
Alert, oriented mostly; had to reorient to day/date and upcoming dates. Denies pain;reports feeling much better. No diarrhea thus far this shift. Wife in and updated. PT consult done. Rocephin IV continued.

## 2017-02-23 NOTE — Clinical Social Work Note (Signed)
CSW received consult that patient is from [facility]. CSW contacted patient who reported that he is from [facility]. CSW not needed for discharge to independent living. CSW signing off, but please consult should any needs arise.  Dean Garcia, MSW, Latanya Presser [phone]

## 2017-02-23 NOTE — Consult Note (Signed)
11:38 AM   Ricci Barker 04/27/1928 [id number]  Referring provider: Dr. Loletha Grayer  Chief Complaint  Patient presents with  . Testicle Pain  . Urinary Tract Infection    HPI: The patient is a 83 year old gentleman with a past medical history of prostate cancer, bladder cancer, urinary retention on Flomax who presented to the hospital with persistent scrotal and perineal pain for which urology was consulted. He was seen and discharged from the emergency department 3 days ago. At that time a Foley catheter was placed for retention of unknown volume. A scrotal ultrasound was unremarkable. A urine culture was obtained and grew Escherichia coli sensitive to Cipro. He was started on this medication discharge. He returned to symptoms are persistent and starting to worsen. His main complaint was pain in his perineum and scrotum. He denies fevers or chills. This pain has resolved and has no complaints at this time.  He does have history of urinary retention requiring catheterization that spontaneously resolved. When he was last seen in our office approximately one month ago his PVR was normal.   Background history He does have a personal history of prostate cancer, dx in 1993 treated EBRT.  He was followed for "many years" without recurrence. He notes that his PSA's were essentially undetectable.   Current PSA <0.1 12/2016.  He also has a history of bladder cancer. He had a TURBT in 1995 at [facility] in [place], [place].  Based on his account, he had 2 small tumors removed from his bladder with no additional treatment. He was followed for 5 years with annual cystoscopy at which time no recurrence was identified.  Most recent cross-sectional imaging in the form of CT abdomen and pelvis without contrast on 03/02/2015 shows no obvious evidence of metastatic disease.    PMH: Past Medical History:  Diagnosis Date  . Atrial fibrillation ([place])   . Bladder cancer ([place])   .  Bundle branch block, right   . Carotid stenosis   . Colon polyp   . CVA (cerebral infarction)   . Diabetes ([place])   . Gout   . High cholesterol   . Hypertension   . Mitral valve prolapse   . Prostate cancer ([place])   . Tremor     Surgical History: Past Surgical History:  Procedure Laterality Date  . APPENDECTOMY    . CAROTID ARTERY ANGIOPLASTY    . CHOLECYSTECTOMY       Allergies:  Allergies  Allergen Reactions  . Demerol [Meperidine] Nausea And Vomiting and Nausea Only    Other reaction(s): Nausea And Vomiting, Vomiting Nausea/vomiting    Family History: Family History  Problem Relation Age of Onset  . Hypertension Father   . Stroke Father   . Diabetes Mother   . Prostate cancer Neg Hx   . Kidney cancer Neg Hx   . Kidney disease Neg Hx     Social History:  reports that he quit smoking about 6 years ago. His smoking use included Pipe. He has never used smokeless tobacco. He reports that he drinks alcohol. He reports that he does not use drugs.  ROS: 12 point ROS negative except for above                                        Physical Exam: BP (!) 168/63 (BP Location: Right Arm)   Pulse (!) 58   Temp 98.2  F (36.8 C)   Resp 20   Ht 6\' 1"  (1.854 m)   Wt 158 lb 4.8 oz (71.8 kg)   SpO2 98%   BMI 20.89 kg/m   Constitutional:  Alert and oriented, No acute distress. HEENT: [place] AT, moist mucus membranes.  Trachea midline, no masses. Cardiovascular: No clubbing, cyanosis, or edema. Respiratory: Normal respiratory effort, no increased work of breathing. GI: Abdomen is soft, nontender, nondistended, no abdominal masses GU: No CVA tenderness. Normal phallus. Foley in place with clear yellow urine. DRE: 2+ boggy consistent with prostatitis. Skin: No rashes, bruises or suspicious lesions. Lymph: No cervical or inguinal adenopathy. Neurologic: Grossly intact, no focal deficits, moving all 4 extremities. Psychiatric: Normal mood and  affect.  Laboratory Data: Lab Results  Component Value Date   WBC 8.5 02/22/2017   HGB 10.2 (L) 02/22/2017   HCT 30.9 (L) 02/22/2017   MCV 88.0 02/22/2017   PLT 225 02/22/2017    Lab Results  Component Value Date   CREATININE 2.19 (H) 02/23/2017    No results found for: PSA  No results found for: TESTOSTERONE  Lab Results  Component Value Date   HGBA1C 6.7 (H) 02/27/2013    Urinalysis    Component Value Date/Time   COLORURINE YELLOW (A) 02/20/2017 0726   APPEARANCEUR TURBID (A) 02/20/2017 0726   APPEARANCEUR Clear 03/02/2015 2019   LABSPEC 1.011 02/20/2017 0726   LABSPEC 1.009 03/02/2015 2019   PHURINE 8.0 02/20/2017 0726   GLUCOSEU NEGATIVE 02/20/2017 0726   GLUCOSEU Negative 03/02/2015 2019   HGBUR SMALL (A) 02/20/2017 0726   BILIRUBINUR NEGATIVE 02/20/2017 0726   BILIRUBINUR Negative 03/02/2015 2019   KETONESUR NEGATIVE 02/20/2017 0726   PROTEINUR 100 (A) 02/20/2017 0726   NITRITE NEGATIVE 02/20/2017 0726   LEUKOCYTESUR MODERATE (A) 02/20/2017 0726   LEUKOCYTESUR Negative 03/02/2015 2019    Pertinent Imaging: Reviewed scrotal u/s  Assessment & Plan:    1. Prostatitis 2. Urinary retention 3. History of bladder cancer 4. History of prostate cancer At this point, I would recommend treating the patient for prostatitis given his positive urine culture and examination findings. At discharge, I would recommend long course of Cipro 500 mg. I would recommend daily dosing to adjust for his renal function. He already has follow-up scheduled for next week for a trial of void. He should keep this appointment. He should also stay on his Flomax at this point. Okay for discharge home from urological standpoint.  Nickie Retort, MD  [facility] [address] [place], [place] [address] [phone]

## 2017-02-23 NOTE — Evaluation (Signed)
Physical Therapy Evaluation Patient Details Name: Dean Garcia MRN: [id number] DOB: [birth year] Today's Date: 02/23/2017   History of Present Illness  Patient is an 83 y/o male that presents with pain in testicular area and blood in catheter, found to have epididymitis.   Clinical Impression  Patient reports he has been receiving PT services at home at [facility], denies any falls. He demonstrates mild trunkal weakness initially with supine to sit transfer and requires use of UEs to perform sit to stand. He is able to ambulate community distance with RW, albeit slowly. No buckling or obvious loss of balance noted. He appears roughly at his baseline, though continuation of therapy services at [facility] is advisable given his need for UEs for transfers.     Follow Up Recommendations Home health PT (He has been receiving therapy at [facility] per patient)    Equipment Recommendations       Recommendations for Other Services       Precautions / Restrictions Precautions Precautions: Fall Restrictions Weight Bearing Restrictions: No      Mobility  Bed Mobility Overal bed mobility: Needs Assistance Bed Mobility: Supine to Sit     Supine to sit: Supervision     General bed mobility comments: Patient requires prolonged time with HOB elevated, no PT physical assistance required.   Transfers Overall transfer level: Needs assistance Equipment used: Rolling walker (2 wheeled) Transfers: Sit to/from Stand Sit to Stand: Supervision         General transfer comment: No loss of balance, use of UEs to complete with prolonged time to complete.   Ambulation/Gait Ambulation/Gait assistance: Supervision Ambulation Distance (Feet): 700 Feet Assistive device: Rolling walker (2 wheeled) Gait Pattern/deviations: Decreased step length - right;Decreased step length - left   Gait velocity interpretation: <1.8 ft/sec, indicative of risk for recurrent falls General Gait Details: Patient with  slow step through gait pattern, no loss of balance, appropriate change of direction.   Stairs            Wheelchair Mobility    Modified Rankin (Stroke Patients Only)       Balance Overall balance assessment: Needs assistance Sitting-balance support: No upper extremity supported Sitting balance-Leahy Scale: Good     Standing balance support: Bilateral upper extremity supported Standing balance-Leahy Scale: Good                               Pertinent Vitals/Pain Pain Assessment: No/denies pain    Home Living Family/patient expects to be discharged to:: Private residence Living Arrangements: Spouse/significant other Available Help at Discharge: Family;Home health Type of Home: Independent living facility [facility]) Home Access: Stairs to enter Entrance Stairs-Rails: Left Entrance Stairs-Number of Steps: 2 Home Layout: One level Home Equipment: Environmental consultant - 4 wheels;Grab bars - toilet (Transport chair)      Prior Function Level of Independence: Independent with assistive device(s)         Comments: (-) driving (spouse drives); uses rollator in home and community (has one in house and one in trunk of car); denies any falls in past 6 months; reports having home health aid (vs OT?) that helps with showers and ex's 3x/week for 2 hours each time; pt reports chronic LE painful spasms.     Hand Dominance        Extremity/Trunk Assessment   Upper Extremity Assessment Upper Extremity Assessment: Overall WFL for tasks assessed    Lower Extremity Assessment Lower Extremity  Assessment: Overall WFL for tasks assessed       Communication   Communication: No difficulties  Cognition Arousal/Alertness: Awake/alert Behavior During Therapy: WFL for tasks assessed/performed Overall Cognitive Status: Within Functional Limits for tasks assessed                                        General Comments      Exercises     Assessment/Plan     PT Assessment Patient needs continued PT services  PT Problem List Decreased strength;Decreased mobility       PT Treatment Interventions Gait training;DME instruction;Therapeutic activities;Therapeutic exercise;Stair training;Functional mobility training;Balance training    PT Goals (Current goals can be found in the Care Plan section)  Acute Rehab PT Goals Patient Stated Goal: To return home  PT Goal Formulation: With patient Time For Goal Achievement: 03/09/17 Potential to Achieve Goals: Good    Frequency Min 2X/week   Barriers to discharge        Co-evaluation               End of Session Equipment Utilized During Treatment: Gait belt Activity Tolerance: Patient tolerated treatment well Patient left: in chair;with chair alarm set;with call bell/phone within reach Nurse Communication: Mobility status PT Visit Diagnosis: Muscle weakness (generalized) (M62.81);Difficulty in walking, not elsewhere classified (R26.2)    Time: 0981-1914 PT Time Calculation (min) (ACUTE ONLY): 28 min   Charges:   PT Evaluation $PT Eval Moderate Complexity: 1 Procedure     PT G Codes:       Royce Macadamia PT, DPT, CSCS     02/23/2017, 1:29 PM

## 2017-02-23 NOTE — Progress Notes (Signed)
[facility] [place] NAME: Dean Garcia    MR#:  [id number]  DATE OF BIRTH:  31-Oct-1928  SUBJECTIVE:   Patient is here due to testicular pain and noted to have urinary tract infection. Also having some diarrhea.  REVIEW OF SYSTEMS:    Review of Systems  Constitutional: Negative for chills and fever.  HENT: Negative for congestion and tinnitus.   Eyes: Negative for blurred vision and double vision.  Respiratory: Negative for cough, shortness of breath and wheezing.   Cardiovascular: Negative for chest pain, orthopnea and PND.  Gastrointestinal: Positive for diarrhea (loose soft stools). Negative for abdominal pain, nausea and vomiting.  Genitourinary: Negative for dysuria and hematuria.  Neurological: Negative for dizziness, sensory change and focal weakness.  All other systems reviewed and are negative.   Nutrition: heart healthy/carb modified Tolerating Diet: Yes Tolerating PT: Ambulatory  DRUG ALLERGIES:   Allergies  Allergen Reactions  . Demerol [Meperidine] Nausea And Vomiting and Nausea Only    Other reaction(s): Nausea And Vomiting, Vomiting Nausea/vomiting    VITALS:  Blood pressure (!) 168/63, pulse (!) 58, temperature 98.2 F (36.8 C), resp. rate 20, height 6\' 1"  (1.854 m), weight 71.8 kg (158 lb 4.8 oz), SpO2 98 %.  PHYSICAL EXAMINATION:   Physical Exam  GENERAL:  83 y.o.-year-old patient lying in bed in no acute distress.  EYES: Pupils equal, round, reactive to light and accommodation. No scleral icterus. Extraocular muscles intact.  HEENT: Head atraumatic, normocephalic. Oropharynx and nasopharynx clear.  NECK:  Supple, no jugular venous distention. No thyroid enlargement, no tenderness.  LUNGS: Normal breath sounds bilaterally, no wheezing, rales, rhonchi. No use of accessory muscles of respiration.  CARDIOVASCULAR: S1, S2 normal. No murmurs, rubs, or gallops.  ABDOMEN: Soft, nontender, nondistended. Bowel sounds  present. No organomegaly or mass.  EXTREMITIES: No cyanosis, clubbing or edema b/l.    NEUROLOGIC: Cranial nerves II through XII are intact. No focal Motor or sensory deficits b/l.   PSYCHIATRIC: The patient is alert and oriented x 3.  SKIN: No obvious rash, lesion, or ulcer.    LABORATORY PANEL:   CBC  Recent Labs Lab 02/22/17 1325  WBC 8.5  HGB 10.2*  HCT 30.9*  PLT 225   ------------------------------------------------------------------------------------------------------------------  Chemistries   Recent Labs Lab 02/22/17 1325 02/23/17 0410  NA 139  --   K 4.7  --   CL 102  --   CO2 29  --   GLUCOSE 180*  --   BUN 45*  --   CREATININE 2.48* 2.19*  CALCIUM 8.3*  --    ------------------------------------------------------------------------------------------------------------------  Cardiac Enzymes No results for input(s): TROPONINI in the last 168 hours. ------------------------------------------------------------------------------------------------------------------  RADIOLOGY:  No results found.   ASSESSMENT AND PLAN:   83 year old male with past medical history of prostate cancer, history of mitral valve prolapse, hypertension, gout, diabetes, history of previous CVA, defibrillation, bladder cancer who presents to the hospital due to testicular pain and noted to have urinary tract infection.  1. Acute prostatitis-this is the cause of patient's testicular pain and mild hematuria. -Appreciate urology input and continue current antibiotics with IV ceftriaxone. Patient will likely need to be discharged on long-term oral ciprofloxacin renally dosed upon discharge. -Clinically improved since yesterday. Next  2. Hx of Urinary retention-cont. Flomax.  - follow up with Urology for voiding trial as outpatient.   3. CKD Stage III - Cr. Close to baseline and will cont. To monitor.   4. Diarrhea/soft stools -  etiology unclear. Wife thinks this is bacterial but pt.  Is afebrile and has had no loose stools since being in the hospital.  - await stool analysis.   5. Essential HTN - cont. Norvasc  6. GERD - cont. Protonix.   7. DM - cont. SSI. BS stable.   Likely d/c home tomorrow if clinically doing well.    All the records are reviewed and case discussed with Care Management/Social Worker. Management plans discussed with the patient, family and they are in agreement.  CODE STATUS: Full code  DVT Prophylaxis: Hep SQ  TOTAL TIME TAKING CARE OF THIS PATIENT: 30 minutes.   POSSIBLE D/C IN 1-2 DAYS, DEPENDING ON CLINICAL CONDITION.   Henreitta Leber M.D on 02/23/2017 at 12:25 PM  Between 7am to 6pm - Pager - [phone]  After 6pm go to [email or web address] - Proofreader  [facility]  Office  [phone]  CC: Primary care physician; Kirk Ruths., MD

## 2017-02-24 LAB — BASIC METABOLIC PANEL
ANION GAP: 5 (ref 5–15)
BUN: 33 mg/dL — AB (ref 6–20)
CO2: 26 mmol/L (ref 22–32)
Calcium: 7.9 mg/dL — ABNORMAL LOW (ref 8.9–10.3)
Chloride: 104 mmol/L (ref 101–111)
Creatinine, Ser: 2.15 mg/dL — ABNORMAL HIGH (ref 0.61–1.24)
GFR calc Af Amer: 30 mL/min — ABNORMAL LOW (ref >60)
GFR calc non Af Amer: 26 mL/min — ABNORMAL LOW (ref >60)
GLUCOSE: 106 mg/dL — AB (ref 65–99)
POTASSIUM: 3.8 mmol/L (ref 3.5–5.1)
Sodium: 135 mmol/L (ref 135–145)

## 2017-02-24 LAB — GLUCOSE, CAPILLARY: Glucose-Capillary: 97 mg/dL (ref 65–99)

## 2017-02-24 MED ORDER — CIPROFLOXACIN HCL 250 MG PO TABS: 500.0000 mg | ORAL_TABLET | Freq: Every day | ORAL | 0 refills | Status: DC

## 2017-02-24 NOTE — Progress Notes (Signed)
Pt reports feels ready to go home. Denies co's/concerns. Foley remains intact/draining. Enteric precautions and stools specimen orders discontinued due to no stool since admission.

## 2017-02-24 NOTE — Progress Notes (Signed)
Discharge home with wife to self care.

## 2017-02-24 NOTE — Progress Notes (Signed)
Oral and written AVS instructions given to wife and pt with verbalizing understanding. Prescription for cipro given to wife. Yellow DNR form in discharge packet.

## 2017-02-25 NOTE — Discharge Summary (Signed)
[facility] NAME: Dean Garcia    MR#:  [id number]  DATE OF BIRTH:  06/15/1928  DATE OF ADMISSION:  02/22/2017 ADMITTING PHYSICIAN: Loletha Grayer, MD  DATE OF DISCHARGE: 02/24/2017 10:45 AM  PRIMARY CARE PHYSICIAN: Kirk Ruths., MD    ADMISSION DIAGNOSIS:  Acute prostatitis [N41.0] Gross hematuria [R31.0] AKI (acute kidney injury) ([place]) [N17.9]  DISCHARGE DIAGNOSIS:  Active Problems:   Epididymitis   SECONDARY DIAGNOSIS:   Past Medical History:  Diagnosis Date  . Atrial fibrillation ([place])   . Bladder cancer ([place])   . Bundle branch block, right   . Carotid stenosis   . Colon polyp   . CVA (cerebral infarction)   . Diabetes ([place])   . Gout   . High cholesterol   . Hypertension   . Mitral valve prolapse   . Prostate cancer ([place])   . Tremor     HOSPITAL COURSE:   83 year old male with past medical history of prostate cancer, history of mitral valve prolapse, hypertension, gout, diabetes, history of previous CVA, defibrillation, bladder cancer who presents to the hospital due to testicular pain and noted to have urinary tract infection.  1. Acute prostatitis-this was the cause of patient's testicular pain and mild hematuria. - pt. Was seen by urology And they recommended continuing IV antibiotics which improved the patient's symptoms. Patient was treated with IV ceftriaxone, and discharged on long-term oral ciprofloxacin. Patient has a follow-up with urology coming up within a week after discharge.   2. Hx of Urinary retention- he will cont. Flomax. She was status post Foley catheter placement and he will be discharged with that and to have a voiding trial done at the urologist office within the next week to 2.  3. CKD Stage III - pt's Cr. Remained closed to baseline and can be further followed as outpatient.    4. Diarrhea/soft stools - patient had no further episodes of diarrhea while in the hospital. He was  placed on enteric precautions and will plan on getting stool cultures but he was not able to give [place] a sample. He is now clinically asymptomatic.  5. Essential HTN - he will cont. Norvasc  6. GERD - he will cont. Protonix.   7. DM -the hospital patient was maintained on sliding scale insulin, not being discharged on his [place]:   Stable.   CONSULTS OBTAINED:  Treatment Team:  Nickie Retort, MD  DRUG ALLERGIES:   Allergies  Allergen Reactions  . Demerol [Meperidine] Nausea And Vomiting and Nausea Only    Other reaction(s): Nausea And Vomiting, Vomiting Nausea/vomiting    DISCHARGE MEDICATIONS:   Allergies as of 02/24/2017      Reactions   Demerol [meperidine] Nausea And Vomiting, Nausea Only   Other reaction(s): Nausea And Vomiting, Vomiting Nausea/vomiting      Medication List    STOP taking these medications   cephALEXin 250 MG capsule Commonly known as:  KEFLEX   ELIQUIS 2.5 MG Tabs tablet Generic drug:  apixaban     TAKE these medications   ACCU-CHEK COMPACT PLUS test strip Generic drug:  glucose blood   allantoin gel Apply topically as needed for wound care.   allopurinol 100 MG tablet Commonly known as:  ZYLOPRIM Take 100 mg by mouth daily.   amLODipine 5 MG tablet Commonly known as:  NORVASC Take 1 tablet (5 mg total) by mouth daily.   B-D ULTRAFINE III SHORT PEN 31G X  8 MM Misc Generic drug:  Insulin Pen Needle USE AS DIRECTED   ciprofloxacin 250 MG tablet Commonly known as:  CIPRO Take 2 tablets (500 mg total) by mouth daily.   cyanocobalamin 1000 MCG/ML injection Commonly known as:  (VITAMIN B-12) Inject 1,000 mcg into the muscle every 30 (thirty) days. 8th of month   docusate sodium 100 MG capsule Commonly known as:  COLACE Take 1 capsule (100 mg total) by mouth 2 (two) times daily.   DULoxetine 30 MG capsule Commonly known as:  CYMBALTA Take 1 capsule (30 mg total) by mouth daily.    HYDROcodone-acetaminophen 10-325 MG tablet Commonly known as:  NORCO Take 1 tablet by mouth every 6 (six) hours as needed for moderate pain.   mirtazapine 30 MG tablet Commonly known as:  REMERON Take 1 tablet (30 mg total) by mouth at bedtime.   omeprazole 20 MG capsule Commonly known as:  PRILOSEC Take 20 mg by mouth daily.   ondansetron 4 MG disintegrating tablet Commonly known as:  ZOFRAN-ODT Take 4 mg by mouth every 8 (eight) hours as needed for nausea or vomiting.   potassium chloride 10 MEQ tablet Commonly known as:  K-DUR Take 10 mEq by mouth daily.   primidone 50 MG tablet Commonly known as:  MYSOLINE Take 50 mg by mouth 2 (two) times daily.   ranitidine 150 MG tablet Commonly known as:  ZANTAC Take 300 mg by mouth every evening.   sitaGLIPtin 25 MG tablet Commonly known as:  JANUVIA Take 25 mg by mouth daily.   tamsulosin 0.4 MG Caps capsule Commonly known as:  FLOMAX Take 1 capsule (0.4 mg total) by mouth daily.   torsemide 20 MG tablet Commonly known as:  DEMADEX Take 20 mg by mouth 2 (two) times daily.   traZODone 50 MG tablet Commonly known as:  DESYREL Take 50 mg by mouth at bedtime.         DISCHARGE INSTRUCTIONS:   DIET:  Cardiac diet and Diabetic diet  DISCHARGE CONDITION:  Stable  ACTIVITY:  Activity as tolerated  OXYGEN:  Home Oxygen: No.   Oxygen Delivery: room air  DISCHARGE LOCATION:  home   If you experience worsening of your admission symptoms, develop shortness of breath, life threatening emergency, suicidal or homicidal thoughts you must seek medical attention immediately by calling 911 or calling your MD immediately  if symptoms less severe.  You Must read complete instructions/literature along with all the possible adverse reactions/side effects for all the Medicines you take and that have been prescribed to you. Take any new Medicines after you have completely understood and accpet all the possible adverse  reactions/side effects.   Please note  You were cared for by a hospitalist during your hospital stay. If you have any questions about your discharge medications or the care you received while you were in the hospital after you are discharged, you can call the unit and asked to speak with the hospitalist on call if the hospitalist that took care of you is not available. Once you are discharged, your primary care physician will handle any further medical issues. Please note that NO REFILLS for any discharge medications will be authorized once you are discharged, as it is imperative that you return to your primary care physician (or establish a relationship with a primary care physician if you do not have one) for your aftercare needs so that they can reassess your need for medications and monitor your lab values.  Today   No further diarrhea or loose stools. No fever. Will d/c home on Oral abx. Today. Testicular pain improved and resolved. No further hematuria.   VITAL SIGNS:  Blood pressure (!) 162/78, pulse (!) 53, temperature 99.3 F (37.4 C), temperature source Oral, resp. rate 20, height 6\' 1"  (1.854 m), weight 71.8 kg (158 lb 4.8 oz), SpO2 96 %.  I/O:  No intake or output data in the 24 hours ending 02/25/17 1536  PHYSICAL EXAMINATION:   GENERAL:  83 y.o.-year-old patient lying in bed in no acute distress.  EYES: Pupils equal, round, reactive to light and accommodation. No scleral icterus. Extraocular muscles intact.  HEENT: Head atraumatic, normocephalic. Oropharynx and nasopharynx clear.  NECK:  Supple, no jugular venous distention. No thyroid enlargement, no tenderness.  LUNGS: Normal breath sounds bilaterally, no wheezing, rales, rhonchi. No use of accessory muscles of respiration.  CARDIOVASCULAR: S1, S2 normal. No murmurs, rubs, or gallops.  ABDOMEN: Soft, nontender, nondistended. Bowel sounds present. No organomegaly or mass.  EXTREMITIES: No cyanosis, clubbing or edema  b/l.    NEUROLOGIC: Cranial nerves II through XII are intact. No focal Motor or sensory deficits b/l.   PSYCHIATRIC: The patient is alert and oriented x 3.  SKIN: No obvious rash, lesion, or ulcer.   Foley cath in place with yellow urine draining.  DATA REVIEW:   CBC  Recent Labs Lab 02/22/17 1325  WBC 8.5  HGB 10.2*  HCT 30.9*  PLT 225    Chemistries   Recent Labs Lab 02/24/17 0327  NA 135  K 3.8  CL 104  CO2 26  GLUCOSE 106*  BUN 33*  CREATININE 2.15*  CALCIUM 7.9*    Cardiac Enzymes No results for input(s): TROPONINI in the last 168 hours.  Microbiology Results  Results for orders placed or performed during the hospital encounter of 02/20/17  Urine culture     Status: Abnormal   Collection Time: 02/20/17  7:26 AM  Result Value Ref Range Status   Specimen Description URINE, RANDOM  Final   Special Requests NONE  Final   Culture >=100,000 COLONIES/mL ESCHERICHIA COLI (A)  Final   Report Status 02/22/2017 FINAL  Final   Organism ID, Bacteria ESCHERICHIA COLI (A)  Final      Susceptibility   Escherichia coli - MIC*    AMPICILLIN >=32 RESISTANT Resistant     CEFAZOLIN <=4 SENSITIVE Sensitive     CEFTRIAXONE <=1 SENSITIVE Sensitive     CIPROFLOXACIN <=0.25 SENSITIVE Sensitive     GENTAMICIN <=1 SENSITIVE Sensitive     IMIPENEM <=0.25 SENSITIVE Sensitive     NITROFURANTOIN <=16 SENSITIVE Sensitive     TRIMETH/SULFA >=320 RESISTANT Resistant     AMPICILLIN/SULBACTAM 16 INTERMEDIATE Intermediate     PIP/TAZO <=4 SENSITIVE Sensitive     Extended ESBL NEGATIVE Sensitive     * >=100,000 COLONIES/mL ESCHERICHIA COLI    RADIOLOGY:  No results found.    Management plans discussed with the patient, family and they are in agreement.  CODE STATUS:  Code Status History    Date Active Date Inactive Code Status Order ID Comments User Context   02/22/2017  3:00 PM 02/24/2017  2:24 PM DNR [id number]  Loletha Grayer, MD ED   01/24/2017 11:23 AM 01/24/2017  5:25 PM DNR  [id number]  Vaughan Basta, MD Inpatient   01/20/2017 12:10 AM 01/24/2017 11:22 AM Full Code [id number]  Idelle Crouch, MD ED   01/19/2017  9:54 PM 01/20/2017 12:10 AM DNR [id number]  [place], DO ED   11/29/2016  5:16 AM 12/04/2016  8:01 PM DNR [id number]  Harrie Foreman, MD Inpatient   11/19/2016  7:00 PM 11/23/2016  8:20 PM DNR [id number]  Gladstone Lighter, MD Inpatient   02/12/2016  8:30 PM 02/14/2016  6:23 PM DNR [id number]  Hillary Bow, MD ED    Questions for Most Recent Historical Code Status (Order [id number])    Question Answer Comment   In the event of cardiac or respiratory ARREST Do not call a "code blue"    In the event of cardiac or respiratory ARREST Do not perform Intubation, CPR, defibrillation or ACLS    In the event of cardiac or respiratory ARREST Use medication by any route, position, wound care, and other measures to relive pain and suffering. May use oxygen, suction and manual treatment of airway obstruction as needed for comfort.    Comments nurse may pronounce         Advance Directive Documentation     Most Recent Value  Type of Advance Directive  Living will, Out of facility DNR (pink MOST or yellow form)  Pre-existing out of facility DNR order (yellow form or pink MOST form)  Yellow form placed in chart (order not valid for inpatient use)  "MOST" Form in Place?  -      TOTAL TIME TAKING CARE OF THIS PATIENT: 40 minutes.    Henreitta Leber M.D on 02/25/2017 at 3:36 PM  Between 7am to 6pm - Pager - [phone]  After 6pm go to [email or web address] - Proofreader  [facility]  Office  [phone]  CC: Primary care physician; Kirk Ruths., MD

## 2017-02-26 ENCOUNTER — Encounter: Payer: Self-pay | Admitting: *Deleted

## 2017-02-26 ENCOUNTER — Emergency Department: Payer: Medicare Other

## 2017-02-26 ENCOUNTER — Telehealth: Payer: Self-pay | Admitting: Urology

## 2017-02-26 ENCOUNTER — Emergency Department
Admission: EM | Admit: 2017-02-26 | Discharge: 2017-02-27 | Disposition: A | Discharge status: Home / Self Care | Payer: Medicare Other | Attending: Emergency Medicine | Admitting: Emergency Medicine

## 2017-02-26 DIAGNOSIS — Z794 Long term (current) use of insulin: Secondary | ICD-10-CM | POA: Insufficient documentation

## 2017-02-26 DIAGNOSIS — Z8551 Personal history of malignant neoplasm of bladder: Secondary | ICD-10-CM | POA: Insufficient documentation

## 2017-02-26 DIAGNOSIS — N419 Inflammatory disease of prostate, unspecified: Secondary | ICD-10-CM | POA: Diagnosis not present

## 2017-02-26 DIAGNOSIS — I129 Hypertensive chronic kidney disease with stage 1 through stage 4 chronic kidney disease, or unspecified chronic kidney disease: Secondary | ICD-10-CM | POA: Insufficient documentation

## 2017-02-26 DIAGNOSIS — E1122 Type 2 diabetes mellitus with diabetic chronic kidney disease: Secondary | ICD-10-CM | POA: Diagnosis not present

## 2017-02-26 DIAGNOSIS — Z87891 Personal history of nicotine dependence: Secondary | ICD-10-CM | POA: Diagnosis not present

## 2017-02-26 DIAGNOSIS — N189 Chronic kidney disease, unspecified: Secondary | ICD-10-CM | POA: Diagnosis not present

## 2017-02-26 DIAGNOSIS — Z79899 Other long term (current) drug therapy: Secondary | ICD-10-CM | POA: Insufficient documentation

## 2017-02-26 DIAGNOSIS — Z8546 Personal history of malignant neoplasm of prostate: Secondary | ICD-10-CM | POA: Insufficient documentation

## 2017-02-26 DIAGNOSIS — R300 Dysuria: Secondary | ICD-10-CM | POA: Diagnosis present

## 2017-02-26 LAB — CBC
HCT: 35.9 % — ABNORMAL LOW (ref 40.0–52.0)
Hemoglobin: 11.6 g/dL — ABNORMAL LOW (ref 13.0–18.0)
MCH: 28.7 pg (ref 26.0–34.0)
MCHC: 32.4 g/dL (ref 32.0–36.0)
MCV: 88.7 fL (ref 80.0–100.0)
PLATELETS: 336 10*3/uL (ref 150–440)
RBC: 4.05 MIL/uL — ABNORMAL LOW (ref 4.40–5.90)
RDW: 15.7 % — AB (ref 11.5–14.5)
WBC: 8.8 10*3/uL (ref 3.8–10.6)

## 2017-02-26 LAB — BASIC METABOLIC PANEL
Anion gap: 9 (ref 5–15)
BUN: 40 mg/dL — AB (ref 6–20)
CO2: 27 mmol/L (ref 22–32)
Calcium: 8.9 mg/dL (ref 8.9–10.3)
Chloride: 101 mmol/L (ref 101–111)
Creatinine, Ser: 2.55 mg/dL — ABNORMAL HIGH (ref 0.61–1.24)
GFR calc Af Amer: 25 mL/min — ABNORMAL LOW (ref >60)
GFR calc non Af Amer: 21 mL/min — ABNORMAL LOW (ref >60)
Glucose, Bld: 118 mg/dL — ABNORMAL HIGH (ref 65–99)
Potassium: 5.2 mmol/L — ABNORMAL HIGH (ref 3.5–5.1)
Sodium: 137 mmol/L (ref 135–145)

## 2017-02-26 LAB — URINALYSIS, COMPLETE (UACMP) WITH MICROSCOPIC
BILIRUBIN URINE: NEGATIVE
Bacteria, UA: NONE SEEN
Glucose, UA: NEGATIVE mg/dL
Ketones, ur: NEGATIVE mg/dL
NITRITE: NEGATIVE
PH: 6 (ref 5.0–8.0)
Protein, ur: 100 mg/dL — AB
SPECIFIC GRAVITY, URINE: 1.013 (ref 1.005–1.030)

## 2017-02-26 MED ORDER — OXYCODONE-ACETAMINOPHEN 5-325 MG PO TABS
1.0000 | ORAL_TABLET | Freq: Once | ORAL | Status: AC
  Administered 2017-02-26: 1 via ORAL
  Filled 2017-02-26: qty 1

## 2017-02-26 MED ORDER — SODIUM CHLORIDE 0.9 % IV BOLUS (SEPSIS)
1000.0000 mL | Freq: Once | INTRAVENOUS | Status: AC
  Administered 2017-02-26: 1000 mL via INTRAVENOUS

## 2017-02-26 NOTE — Progress Notes (Signed)
02/27/2017 9:06 AM   Dean Garcia 06-15-1928 [id number]  Referring provider: Kirk Ruths, MD [place], [place] [address]  Chief Complaint  Patient presents with  . Follow-up    ER discharge for retention  Foley removal?    HPI: Patient is an 83 year old Caucasian male who presents today for a one month follow up for acute on chronic renal failure and urinary retention.    Background history Patient with a remote history of bladder and prostate cancer who presents today to establish care.  He was recently hospitalized and discharged in 12/04/2016 for generalized weakness, shortness of breath, healthcare associated pneumonia.  He had a previous admission in a few weeks preceding this at which time for acute on chronic renal failure and urinary retention during his admission at which time a Foley catheter was placed.   Cr up to 4.51 from baseline from around 2, improved to 1.70 at the time of discharge with Foley.   He was started on Flomax at that time.  Renal ultrasound after Foley catheter was in place showed no evidence of hydroureteronephrosis.  No previous history of urinary retention.  He doesn't report any issues with urinary frequency, incomplete bladder emptying, weak stream, or any other signs or symptoms of retention prior to his admission in December.  He is not taking any prostate medications prior to this admission.  He denies any gross hematuria, dysuria, or urinary tract infections.  He does have a personal history of prostate cancer, dx in 1993 treated EBRT.  He was followed for "many years" without recurrence. He notes that his PSA's were essentially undetectable.   Current PSA <0.1 12/2016.  He also has a history of bladder cancer. He had a TURBT in 1995 at [facility] in [place], [place].  Based on his account, he had 2 small tumors removed from his bladder with no additional treatment. He was followed for 5  years with annual cystoscopy at which time no recurrence was identified.  Most recent cross-sectional imaging in the form of CT abdomen and pelvis without contrast on 03/02/2015 shows no obvious evidence of metastatic disease. He's also been followed with serial chest CTs tree-in-bud lesions in the right upper and lower lobes which is improving, most recent CT 12/2015.  He is currently residing at [facility] for rehab.    At his visit two months ago, it was decided to discontinue the Foley catheter and manage his urinary retention with CIC.  He has been getting straight cathed in the evenings with volumes ranging from 30 cc to 600 cc.  He has not been straight cathed in several days and is voiding on his own.  His I PSS score was 1/2.  His PVR was 35 mL.        IPSS    Row Name 01/07/17 1000         International Prostate Symptom Score   How often have you had the sensation of not emptying your bladder? Not at All     How often have you had to urinate less than every two hours? Not at All     How often have you found you stopped and started again several times when you urinated? Not at All     How often have you found it difficult to postpone urination? Not at All     How often have you had a weak urinary stream? Less than 1 in 5 times  How often have you had to strain to start urination? Not at All     How many times did you typically get up at night to urinate? None     Total IPSS Score 1       Quality of Life due to urinary symptoms   If you were to spend the rest of your life with your urinary condition just the way it is now how would you feel about that? Mostly Satisfied        Score:  1-7 Mild 8-19 Moderate 20-35 Severe  Patient was admitted to the hospital for prostatitis and urinary retention one week ago. Urine culture was positive for E. Coli and it was sensitive to the Cipro.  He was found to have > 700 cc of urine on bladder scan and Foley was placed.   He has continued  the Flomax.  Scrotal ultrasound performed on 02/20/2017 noted moderate bilateral hydroceles.  I have independently reviewed the films.  He returned to the ED yesterday for testicular pain.  CT of the pelvis w/wo performed 02/26/2017 noted a Foley catheter within decompressed urinary bladder.  Thickening of urinary bladder wall may be due to chronic  Inflammation or cystitis. Small foci of air within anterior aspect urinary bladder may be due to infection or recent instrumentation.  Sigmoid colon diverticula are noted. No evidence of acute diverticulitis. No colonic obstruction. Visualized distal small bowel is unremarkable.  Small bilateral hydrocele. Small edema/fluid noted along the scrotal wall. No significant scrotal distension. No scrotal air-fluid levels.  I have independently reviewed the films.  Today, his catheter is removed.  He is complaining of gross hematuria and UTI's.  He is not having fevers, chills, nausea or vomiting.    PMH: Past Medical History:  Diagnosis Date  . Atrial fibrillation ([place])   . Bladder cancer ([place])   . Bundle branch block, right   . Carotid stenosis   . Colon polyp   . CVA (cerebral infarction)   . Diabetes ([place])   . Gout   . High cholesterol   . Hypertension   . Mitral valve prolapse   . Prostate cancer ([place])   . Tremor     Surgical History: Past Surgical History:  Procedure Laterality Date  . APPENDECTOMY    . CAROTID ARTERY ANGIOPLASTY    . CHOLECYSTECTOMY      Home Medications:  Allergies as of 02/27/2017      Reactions   Demerol [meperidine] Nausea And Vomiting, Nausea Only   Other reaction(s): Nausea And Vomiting, Vomiting Nausea/vomiting      Medication List       Accurate as of 02/27/17  9:06 AM. Always use your most recent med list.          ACCU-CHEK COMPACT PLUS test strip Generic drug:  glucose blood   allantoin gel Apply topically as needed for wound care.   allopurinol 100 MG tablet Commonly known as:   ZYLOPRIM Take 100 mg by mouth daily.   amLODipine 5 MG tablet Commonly known as:  NORVASC Take 1 tablet (5 mg total) by mouth daily.   B-D ULTRAFINE III SHORT PEN 31G X 8 MM Misc Generic drug:  Insulin Pen Needle USE AS DIRECTED   ciprofloxacin 250 MG tablet Commonly known as:  CIPRO Take 2 tablets (500 mg total) by mouth daily.   colchicine 0.6 MG tablet Take by mouth.   cyanocobalamin 1000 MCG/ML injection Commonly known as:  (VITAMIN B-12) Inject 1,000 mcg into  the muscle every 30 (thirty) days. 8th of month   docusate sodium 100 MG capsule Commonly known as:  COLACE Take 1 capsule (100 mg total) by mouth 2 (two) times daily.   DULoxetine 30 MG capsule Commonly known as:  CYMBALTA Take 1 capsule (30 mg total) by mouth daily.   gabapentin 100 MG capsule Commonly known as:  NEURONTIN Take 100 mg twice a day for one week, then to 200 mg twice a day for 1 week, then increase to 200 mg three times a day.   HYDROcodone-acetaminophen 10-325 MG tablet Commonly known as:  NORCO Take 1 tablet by mouth every 6 (six) hours as needed for moderate pain.   HYDROGEL Gel Use.   mirtazapine 30 MG tablet Commonly known as:  REMERON Take 1 tablet (30 mg total) by mouth at bedtime.   omeprazole 20 MG capsule Commonly known as:  PRILOSEC Take 20 mg by mouth daily.   ondansetron 4 MG disintegrating tablet Commonly known as:  ZOFRAN-ODT Take 4 mg by mouth every 8 (eight) hours as needed for nausea or vomiting.   ondansetron 4 MG tablet Commonly known as:  ZOFRAN Take by mouth.   potassium chloride 10 MEQ tablet Commonly known as:  K-DUR Take 10 mEq by mouth daily.   potassium chloride 10 MEQ tablet Commonly known as:  K-DUR,KLOR-CON Take by mouth.   primidone 50 MG tablet Commonly known as:  MYSOLINE Take 50 mg by mouth 2 (two) times daily.   ranitidine 150 MG tablet Commonly known as:  ZANTAC Take 300 mg by mouth every evening.   sitaGLIPtin 25 MG tablet Commonly  known as:  JANUVIA Take 25 mg by mouth daily.   tamsulosin 0.4 MG Caps capsule Commonly known as:  FLOMAX Take 1 capsule (0.4 mg total) by mouth daily.   torsemide 20 MG tablet Commonly known as:  DEMADEX Take 20 mg by mouth 2 (two) times daily.   traZODone 50 MG tablet Commonly known as:  DESYREL Take 50 mg by mouth at bedtime.       Allergies:  Allergies  Allergen Reactions  . Demerol [Meperidine] Nausea And Vomiting and Nausea Only    Other reaction(s): Nausea And Vomiting, Vomiting Nausea/vomiting    Family History: Family History  Problem Relation Age of Onset  . Hypertension Father   . Stroke Father   . Diabetes Mother   . Prostate cancer Neg Hx   . Kidney cancer Neg Hx   . Kidney disease Neg Hx   . Bladder Cancer Neg Hx     Social History:  reports that he quit smoking about 6 years ago. His smoking use included Pipe. He has never used smokeless tobacco. He reports that he drinks alcohol. He reports that he does not use drugs.  ROS: UROLOGY Frequent Urination?: No Hard to postpone urination?: No Burning/pain with urination?: No Get up at night to urinate?: No Leakage of urine?: No Urine stream starts and stops?: No Trouble starting stream?: No Do you have to strain to urinate?: No Blood in urine?: Yes Urinary tract infection?: Yes Sexually transmitted disease?: No Injury to kidneys or bladder?: No Painful intercourse?: No Weak stream?: No Erection problems?: No Penile pain?: No  Gastrointestinal Nausea?: No Vomiting?: No Indigestion/heartburn?: No Diarrhea?: Yes Constipation?: No  Constitutional Fever: No Night sweats?: No Weight loss?: No Fatigue?: No  Skin Skin rash/lesions?: No Itching?: No  Eyes Blurred vision?: No Double vision?: No  Ears/Nose/Throat Sore throat?: No Sinus problems?: No  Hematologic/Lymphatic  Swollen glands?: No Easy bruising?: No  Cardiovascular Leg swelling?: Yes Chest pain?:  No  Respiratory Cough?: No Shortness of breath?: Yes  Endocrine Excessive thirst?: No  Musculoskeletal Back pain?: No Joint pain?: Yes  Neurological Headaches?: No Dizziness?: No  Psychologic Depression?: Yes Anxiety?: Yes  Physical Exam: BP (!) 121/52   Pulse 69   Ht 6\' 1"  (1.854 m)   Wt 163 lb 11.2 oz (74.3 kg)   BMI 21.60 kg/m   Constitutional:  Alert and oriented, No acute distress.  In a wheelchair. Frail-appearing. Accompanied by wife.  Excellent historian. HEENT: [place] AT, moist mucus membranes.  Trachea midline, no masses. Cardiovascular: No clubbing, cyanosis, or edema. Respiratory: Normal respiratory effort, no increased work of breathing. GI: Abdomen is soft, nontender, nondistended, no abdominal masses Skin: No rashes, bruises or suspicious lesions. Neurologic: Grossly intact, no focal deficits, moving all 4 extremities.  Unable to assess ambulation due to wheelchair. Unsteady on feet. Psychiatric: Normal mood and affect.  Laboratory Data: Lab Results  Component Value Date   WBC 8.8 02/26/2017   HGB 11.6 (L) 02/26/2017   HCT 35.9 (L) 02/26/2017   MCV 88.7 02/26/2017   PLT 336 02/26/2017    Lab Results  Component Value Date   CREATININE 2.14 (H) 02/26/2017    Lab Results  Component Value Date   HGBA1C 6.7 (H) 02/27/2013    Assessment & Plan:    1. Urinary retention  - due to prostatitis  - Continue Flomax; refills given  - foley catheter removed  - voiding trial today    - return if unable to urinate or experiencing suprapubic discomfort  - follow-up in one month for I PSS score, PVR and exam.  2. History of prostate cancer  - current PSA is <0.1 ng/mL - 12/2016  - recheck PSA in one year  3. History of bladder cancer   - Dx' 95 without obvious recurrence  4. Acute renal failure superimposed on stage 3 chronic kidney disease, unspecified acute renal failure type ([place]) Likely secondary to obstructive nephropathy, patient undergoing  another voiding trial today Currently at baseline Bladder management as above.   Return in about 1 month (around 03/30/2017) for IPSS, exam and PVR.  Zara Council, [facility] [address], [place], [place] [address] [phone]

## 2017-02-26 NOTE — Telephone Encounter (Signed)
Dean Garcia calls from Palliative care asking for results for Cx over the past weekend at the ER. Caller states she hopes that he can make tomorrow appt without being admitted today. Pt having other issues as well. Please advise.

## 2017-02-26 NOTE — Telephone Encounter (Signed)
Spoke with Dorian Pod, NP in reference to pt and ucx. Ivin Booty stated that pt is currently having prostatitis like pain again and was wanting to see [facility] instead of going back to the ER. Pt was supposed to be seen for a nurse visit tomorrow. Was able to get pt scheduled with [facility]. Made Ivin Booty aware. Ivin Booty stated she would make pt aware.

## 2017-02-26 NOTE — ED Notes (Signed)
Patient transported to CT 

## 2017-02-26 NOTE — ED Provider Notes (Signed)
[facility] Emergency Department Provider Note  ____________________________________________   First MD Initiated Contact with Patient 02/26/17 1828     (approximate)  I have reviewed the triage vital signs and the nursing notes.   HISTORY  Chief Complaint Dysuria   HPI Dean Garcia is a 83 y.o. male with a recent admission for prostatitis was presenting to the emergency department today with continued perennial pain. He says that he has also noted some small blood clots in his urine but none today. Patient says that especially when he sits he has pain to the cranium. Says that he has been taking 10 mg of hydrocodone as needed but this is only relieving the pain minimally. Denies any fever home. Is compliant with his antibiotics.    Past Medical History:  Diagnosis Date  . Atrial fibrillation (Dean Garcia)   . Bladder cancer (Dean Garcia)   . Bundle branch block, right   . Carotid stenosis   . Colon polyp   . CVA (cerebral infarction)   . Diabetes ([place])   . Gout   . High cholesterol   . Hypertension   . Mitral valve prolapse   . Prostate cancer (Dean Garcia)   . Tremor     Patient Active Problem List   Diagnosis Date Noted  . Epididymitis 02/22/2017  . Pain in limb 02/07/2017  . Hypoglycemia 01/19/2017  . Depression 01/19/2017  . Hypoglycemia due to insulin 01/19/2017  . Muscle weakness (generalized)   . SOB (shortness of breath)   . Palliative care by specialist   . HCAP (healthcare-associated pneumonia) 11/29/2016  . Pressure injury of skin 11/20/2016  . Palliative care encounter   . Goals of care, counseling/discussion   . Acute renal failure superimposed on chronic kidney disease ([place])   . ARF (acute renal failure) ([place]) 11/19/2016  . Moderate major depression, single episode ([place]) 10/22/2016  . Mild dementia 10/22/2016  . Paroxysmal atrial fibrillation ([place]) 02/21/2016  . A-fib ([place]) 02/12/2016  . Centrilobular emphysema ([place]) 12/27/2015  . B12  deficiency 04/12/2015  . Atherosclerosis of aorta ([place]) 10/23/2014  . 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 Has a tremor 07/17/2014  . Type 2 diabetes mellitus ([place]) 07/17/2014  . Edema 07/17/2014  . Billowing mitral valve 07/12/2014  . BP (high blood pressure) 07/12/2014  . Carotid artery narrowing 07/12/2014  . Impaired renal function 06/07/2014  . Proctitis, radiation 06/07/2014  . Malignant neoplasm of prostate ([place]) 06/07/2014  . Peripheral nerve disease ([place]) 06/07/2014  . DJD (degenerative joint disease) 06/07/2014  . Disorder of peripheral nervous system ([place]) 06/07/2014  . Gout 06/07/2014  . 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 DD (diverticular disease) 06/07/2014  . Disease of rectum 06/07/2014  . Diabetes mellitus ([place]) 06/07/2014  . Malignant neoplasm of urinary bladder ([place]) 06/07/2014    Past Surgical History:  Procedure Laterality Date  . APPENDECTOMY    . CAROTID ARTERY ANGIOPLASTY    . CHOLECYSTECTOMY      Prior to Admission medications   Medication Sig Start Date End Date Taking? Authorizing Provider  allopurinol (ZYLOPRIM) 100 MG tablet Take 100 mg by mouth daily.   Yes Historical Provider, MD  amLODipine (NORVASC) 5 MG tablet Take 1 tablet (5 mg total) by mouth daily. 01/25/17  Yes Vaughan Basta, MD  ciprofloxacin (CIPRO) 250 MG tablet Take 2 tablets (500 mg total) by mouth daily. 02/24/17 03/06/17 Yes Henreitta Leber, MD  cyanocobalamin (,VITAMIN B-12,) 1000 MCG/ML injection Inject 1,000 mcg into the muscle every 30 (  thirty) days. 8th of month 10/31/15  Yes Historical Provider, MD  docusate sodium (COLACE) 100 MG capsule Take 1 capsule (100 mg total) by mouth 2 (two) times daily. 01/24/17  Yes Vaughan Basta, MD  DULoxetine (CYMBALTA) 30 MG capsule Take 1 capsule (30 mg total) by mouth daily. 01/25/17  Yes Vaughan Basta, MD  HYDROcodone-acetaminophen (NORCO) 10-325 MG tablet Take 1 tablet by mouth every 6 (six) hours as  needed for moderate pain. 01/24/17  Yes Vaughan Basta, MD  mirtazapine (REMERON) 30 MG tablet Take 1 tablet (30 mg total) by mouth at bedtime. 01/24/17  Yes Vaughan Basta, MD  omeprazole (PRILOSEC) 20 MG capsule Take 20 mg by mouth daily.  03/09/16 03/09/17 Yes Historical Provider, MD  ondansetron (ZOFRAN-ODT) 4 MG disintegrating tablet Take 4 mg by mouth every 8 (eight) hours as needed for nausea or vomiting.    Yes Historical Provider, MD  potassium chloride (K-DUR) 10 MEQ tablet Take 10 mEq by mouth daily.   Yes Historical Provider, MD  primidone (MYSOLINE) 50 MG tablet Take 50 mg by mouth 2 (two) times daily.    Yes Historical Provider, MD  ranitidine (ZANTAC) 150 MG tablet Take 300 mg by mouth every evening.    Yes Historical Provider, MD  sitaGLIPtin (JANUVIA) 25 MG tablet Take 25 mg by mouth daily.    Yes Historical Provider, MD  tamsulosin (FLOMAX) 0.4 MG CAPS capsule Take 1 capsule (0.4 mg total) by mouth daily. 01/07/17  Yes Shannon A McGowan, PA-C  torsemide (DEMADEX) 20 MG tablet Take 20 mg by mouth 2 (two) times daily.   Yes Historical Provider, MD  traZODone (DESYREL) 50 MG tablet Take 50 mg by mouth at bedtime.   Yes Historical Provider, MD  glucose blood (ACCU-CHEK COMPACT PLUS) test strip  12/07/15   Historical Provider, MD  Insulin Pen Needle (B-D ULTRAFINE III SHORT PEN) 31G X 8 MM MISC USE AS DIRECTED 12/24/16   Historical Provider, MD  Wound Dressings (ALLANTOIN) gel Apply topically as needed for wound care.    Historical Provider, MD    Allergies Demerol [meperidine]  Family History  Problem Relation Age of Onset  . Hypertension Father   . Stroke Father   . Diabetes Mother   . Prostate cancer Neg Hx   . Kidney cancer Neg Hx   . Kidney disease Neg Hx     Social History Social History  Substance Use Topics  . Smoking status: Former Smoker    Types: Pipe    Quit date: 10/29/2010  . Smokeless tobacco: Never Used  . Alcohol use Yes     Comment: 1 beer a day      Review of Systems Constitutional: No fever/chills Eyes: No visual changes. ENT: No sore throat. Cardiovascular: Denies chest pain. Respiratory: Denies shortness of breath. Gastrointestinal: No abdominal pain.  No nausea, no vomiting.  No diarrhea.  No constipation. Genitourinary: Negative for dysuria but says he does have a small amount of pain at the tip of his penis at the insertion point of the Foley. Musculoskeletal: Negative for back pain. Skin: Negative for rash. Neurological: Negative for headaches, focal weakness or numbness.  10-point ROS otherwise negative.  ____________________________________________   PHYSICAL EXAM:  VITAL SIGNS: ED Triage Vitals  Enc Vitals Group     BP 02/26/17 1556 (!) 188/68     Pulse Rate 02/26/17 1556 (!) 56     Resp 02/26/17 1556 18     Temp 02/26/17 1556 97.6 F (36.4 C)  Temp Source 02/26/17 1556 Oral     SpO2 02/26/17 1556 100 %     Weight 02/26/17 1554 162 lb (73.5 kg)     Height 02/26/17 1554 6\' 1"  (1.854 m)     Head Circumference --      Peak Flow --      Pain Score 02/26/17 1553 10     Pain Loc --      Pain Edu? --      Excl. in [place]? --     Constitutional: Alert and oriented. Well appearing and in no acute distress. Eyes: Conjunctivae are normal. PERRL. EOMI. Head: Atraumatic. Nose: No congestion/rhinnorhea. Mouth/Throat: Mucous membranes are moist.   Neck: No stridor.   Cardiovascular: Normal rate, regular rhythm. Grossly normal heart sounds.   Respiratory: Normal respiratory effort.  No retractions. Lungs CTAB. Gastrointestinal: Soft and nontender. No distention.  No CVA ttp.  Genitourinary:  Foley inserted with hazy urine in the leg bag. No breakdown or erosion at the glans of the penis. There is no erythema or induration to the glans of the penis. Mild tenderness to palpation along the perineum without any crepitus, erythema, warmth or induration or fluctuance. Musculoskeletal: No lower extremity tenderness nor  edema.  No joint effusions. Neurologic:  Normal speech and language. No gross focal neurologic deficits are appreciated.  Skin:  Skin is warm, dry and intact. No rash noted. Psychiatric: Mood and affect are normal. Speech and behavior are normal.  ____________________________________________   LABS (all labs ordered are listed, but only abnormal results are displayed)  Labs Reviewed  URINALYSIS, COMPLETE (UACMP) WITH MICROSCOPIC - Abnormal; Notable for the following:       Result Value   Color, Urine YELLOW (*)    APPearance HAZY (*)    Hgb urine dipstick MODERATE (*)    Protein, ur 100 (*)    Leukocytes, UA MODERATE (*)    Squamous Epithelial / LPF 0-5 (*)    All other components within normal limits  BASIC METABOLIC PANEL - Abnormal; Notable for the following:    Potassium 5.2 (*)    Glucose, Bld 118 (*)    BUN 40 (*)    Creatinine, Ser 2.55 (*)    GFR calc non Af Amer 21 (*)    GFR calc Af Amer 25 (*)    All other components within normal limits  CBC - Abnormal; Notable for the following:    RBC 4.05 (*)    Hemoglobin 11.6 (*)    HCT 35.9 (*)    RDW 15.7 (*)    All other components within normal limits  BASIC METABOLIC PANEL - Abnormal; Notable for the following:    Glucose, Bld 176 (*)    BUN 36 (*)    Creatinine, Ser 2.14 (*)    Calcium 7.3 (*)    GFR calc non Af Amer 26 (*)    GFR calc Af Amer 30 (*)    Anion gap 4 (*)    All other components within normal limits  URINE CULTURE   ____________________________________________  EKG   ____________________________________________  RADIOLOGY    CT Pelvis Wo Contrast (Final result)  Result time 02/26/17 35:36:14  Final result by Lahoma Crocker, MD (02/26/17 43:15:40)           Narrative:   CLINICAL DATA: Perineal pain, recent prostatitis  EXAM: CT PELVIS WITHOUT CONTRAST  TECHNIQUE: Multidetector CT imaging of the pelvis was performed following the standard protocol without intravenous  contrast.  COMPARISON: CT  scan 03/02/2015  FINDINGS: Urinary Tract: There is a Foley catheter within decompressed urinary bladder. Mild thickening of urinary bladder wall wall. Mild cystitis or chronic inflammation cannot be excluded. Tiny foci of air within anterior aspect of urinary bladder could represent infection or recent instrumentation. No definite blood products are identified within urinary bladder.  Bowel: Moderate stool noted within visualized right colon. The terminal ileum is unremarkable. Some colonic stool noted within distending colon and sigmoid colon. Multiple sigmoid colon diverticula. No evidence of acute diverticulitis or acute colitis. No distal colonic obstruction. Visualized distal small bowel loops are unremarkable.  Vascular/Lymphatic: Atherosclerotic calcifications bilateral iliac arteries are noted. 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  Other: Small bilateral hydrocele. There is mild subcutaneous edema/ fluid along the scrotal wall. No pelvic free air or pelvic ascites.  Musculoskeletal: No destructive bony lesions are noted. Mild degenerative changes bilateral SI joints. Degenerative changes are noted lumbar spine. Coronal images shows mild degenerative changes bilateral hip joint  IMPRESSION: 1. There is a Foley catheter within decompressed urinary bladder. Thickening of urinary bladder wall may be due to chronic inflammation or cystitis. Small foci of air within anterior aspect urinary bladder may be due to infection or recent instrumentation. 2. Sigmoid colon diverticula are noted. No evidence of acute diverticulitis. No colonic obstruction. Visualized distal small bowel is unremarkable. 3. Small bilateral hydrocele. Small edema/fluid noted along the scrotal wall. No significant scrotal distension. No scrotal air-fluid levels.   Electronically  Signed By: Lahoma Crocker M.D. On: 02/26/2017 20:08            ____________________________________________   PROCEDURES  Procedure(s) performed:   Procedures  Critical Care performed:   ____________________________________________   INITIAL IMPRESSION / ASSESSMENT AND PLAN / ED COURSE  Pertinent labs & imaging results that were available during my care of the patient were reviewed by me and considered in my medical decision making (see chart for details).  ----------------------------------------- 12:21 AM on 02/27/2017 -----------------------------------------  Patient resting comfortable at this time without distress. No signs of progressive infection such as a foiurniet gangrene.  Patient with likely ongoing pain from his prostatitis. He will continue on Cipro. He has follow-up with urology at 9 AM later this morning. I discussed with his wife that he must make this appointment for further evaluation. She is understanding of going to comply.      ____________________________________________   FINAL CLINICAL IMPRESSION(S) / ED DIAGNOSES  Prostatitis.    NEW MEDICATIONS STARTED DURING THIS VISIT:  New Prescriptions   No medications on file     Note:  This document was prepared using Dragon voice recognition software and may include unintentional dictation errors.    Orbie Pyo, MD 02/27/17 [phone]

## 2017-02-26 NOTE — ED Triage Notes (Signed)
Pt states scrotal burning, has a foley in place from admission over weekend, was discharged Sunday, states continued burning and pain, wife states she noticed some blood in his urine yesterday

## 2017-02-27 ENCOUNTER — Encounter: Payer: Self-pay | Admitting: Urology

## 2017-02-27 ENCOUNTER — Ambulatory Visit (INDEPENDENT_AMBULATORY_CARE_PROVIDER_SITE_OTHER): Payer: Medicare Other | Admitting: Urology

## 2017-02-27 VITALS — BP 121/52 | HR 69 | Ht 73.0 in | Wt 163.7 lb

## 2017-02-27 DIAGNOSIS — Z8551 Personal history of malignant neoplasm of bladder: Secondary | ICD-10-CM | POA: Diagnosis not present

## 2017-02-27 DIAGNOSIS — Z8546 Personal history of malignant neoplasm of prostate: Secondary | ICD-10-CM | POA: Diagnosis not present

## 2017-02-27 DIAGNOSIS — N183 Chronic kidney disease, stage 3 unspecified: Secondary | ICD-10-CM

## 2017-02-27 DIAGNOSIS — R339 Retention of urine, unspecified: Secondary | ICD-10-CM | POA: Diagnosis not present

## 2017-02-27 DIAGNOSIS — N179 Acute kidney failure, unspecified: Secondary | ICD-10-CM

## 2017-02-27 LAB — BASIC METABOLIC PANEL
Anion gap: 4 — ABNORMAL LOW (ref 5–15)
BUN: 36 mg/dL — AB (ref 6–20)
CALCIUM: 7.3 mg/dL — AB (ref 8.9–10.3)
CHLORIDE: 108 mmol/L (ref 101–111)
CO2: 26 mmol/L (ref 22–32)
CREATININE: 2.14 mg/dL — AB (ref 0.61–1.24)
GFR calc Af Amer: 30 mL/min — ABNORMAL LOW (ref >60)
GFR calc non Af Amer: 26 mL/min — ABNORMAL LOW (ref >60)
GLUCOSE: 176 mg/dL — AB (ref 65–99)
Potassium: 3.8 mmol/L (ref 3.5–5.1)
Sodium: 138 mmol/L (ref 135–145)

## 2017-02-27 MED ORDER — CIPROFLOXACIN HCL 250 MG PO TABS: 500.0000 mg | ORAL_TABLET | Freq: Every day | ORAL | 0 refills | Status: AC

## 2017-02-27 NOTE — Progress Notes (Signed)
Catheter Removal  Patient is present today for a catheter removal.  9ml of water was drained from the balloon. A 16FR foley cath was removed from the bladder no complications were noted . Patient tolerated well.  Preformed by: Vencil Basnett CMA  

## 2017-02-28 LAB — URINE CULTURE: Culture: NO GROWTH

## 2017-03-08 ENCOUNTER — Ambulatory Visit (INDEPENDENT_AMBULATORY_CARE_PROVIDER_SITE_OTHER): Payer: Medicare Other

## 2017-03-08 ENCOUNTER — Ambulatory Visit (INDEPENDENT_AMBULATORY_CARE_PROVIDER_SITE_OTHER): Payer: Medicare Other | Admitting: Vascular Surgery

## 2017-03-08 ENCOUNTER — Encounter (INDEPENDENT_AMBULATORY_CARE_PROVIDER_SITE_OTHER): Payer: Self-pay | Admitting: Vascular Surgery

## 2017-03-08 VITALS — BP 153/65 | HR 51 | Resp 16 | Ht 73.0 in | Wt 170.0 lb

## 2017-03-08 DIAGNOSIS — M79604 Pain in right leg: Secondary | ICD-10-CM

## 2017-03-08 DIAGNOSIS — M79605 Pain in left leg: Secondary | ICD-10-CM

## 2017-03-08 DIAGNOSIS — I890 Lymphedema, not elsewhere classified: Secondary | ICD-10-CM | POA: Insufficient documentation

## 2017-03-08 NOTE — Progress Notes (Signed)
Subjective:    Patient ID: Dean Garcia, male    DOB: [birth year], 83 y.o.   MRN: [id number] Chief Complaint  Patient presents with  . Re-evaluation    Pain and swelling in legs and calf area   Patient presents sooner that his scheduled appointment with worsening bilateral lower extremity pain and swelling. He was last seen on 02/07/17. He has been to multiple doctors for "lower extremity muscle spasms". He has tried flexeril, cymbalta, and gabapentin with minimal relief. The patient presents in pain. He underwent an ABI today which was unobtainable due to the patients intolerance to touch and inability to remain stable. He does continue to have prominent bilateral edema which has not improved with compression and elevation. He denies any fever, nausea or vomiting. Denies any SOD or chest pain. No ulceration.   Review of Systems  Constitutional: Negative.   HENT: Negative.   Eyes: Negative.   Respiratory: Negative.   Cardiovascular: Positive for leg swelling.       Lower Extremity Pain Muscle Spasm  Gastrointestinal: Negative.   Endocrine: Negative.   Genitourinary: Negative.   Musculoskeletal: Negative.   Skin: Negative.   Allergic/Immunologic: Negative.   Hematological: Negative.   Psychiatric/Behavioral: Negative.       Objective:   Physical Exam  Constitutional: He is oriented to person, place, and time. He appears well-developed and well-nourished. No distress.  HENT:  Head: Normocephalic and atraumatic.  Eyes: Conjunctivae are normal. Pupils are equal, round, and reactive to light.  Neck: Normal range of motion.  Cardiovascular: Normal rate, regular rhythm and normal heart sounds.   Pulses:      Radial pulses are 2+ on the right side, and 2+ on the left side.  Hard to appreciate pedal pulses  Pulmonary/Chest: Effort normal.  Musculoskeletal: Normal range of motion. He exhibits edema (Moderate 1+ pitting edema).  Neurological: He is alert and oriented to person, place,  and time.  Skin: Skin is warm and dry. He is not diaphoretic.  No ulceration noted  Psychiatric: He has a normal mood and affect. His behavior is normal. Judgment and thought content normal.  Vitals reviewed.  BP (!) 153/65 (BP Location: Right Arm)   Pulse (!) 51   Resp 16   Ht 6\' 1"  (1.854 m)   Wt 170 lb (77.1 kg)   BMI 22.43 kg/m   Past Medical History:  Diagnosis Date  . Atrial fibrillation ([place])   . Bladder cancer ([place])   . Bundle branch block, right   . Carotid stenosis   . Colon polyp   . CVA (cerebral infarction)   . Diabetes ([place])   . Gout   . High cholesterol   . Hypertension   . Mitral valve prolapse   . Prostate cancer ([place])   . Tremor    Social History   Social History  . Marital status: Married    Spouse name: N/A  . Number of children: N/A  . Years of education: N/A   Occupational History  . Not on file.   Social History Main Topics  . Smoking status: Former Smoker    Types: Pipe    Quit date: 10/29/2010  . Smokeless tobacco: Never Used  . Alcohol use Yes     Comment: 1 beer a day  . Drug use: No  . Sexual activity: Not on file   Other Topics Concern  . Not on file   Social History Narrative  . No narrative on file   Past  Surgical History:  Procedure Laterality Date  . APPENDECTOMY    . CAROTID ARTERY ANGIOPLASTY    . CHOLECYSTECTOMY     Family History  Problem Relation Age of Onset  . Hypertension Father   . Stroke Father   . Diabetes Mother   . Prostate cancer Neg Hx   . Kidney cancer Neg Hx   . Kidney disease Neg Hx   . Bladder Cancer Neg Hx    Allergies  Allergen Reactions  . Demerol [Meperidine] Nausea And Vomiting and Nausea Only    Other reaction(s): Nausea And Vomiting, Vomiting Nausea/vomiting      Assessment & Plan:  Patient presents sooner that his scheduled appointment with worsening bilateral lower extremity pain and swelling. He was last seen on 02/07/17. He has been to multiple doctors for "lower extremity  muscle spasms". He has tried flexeril, cymbalta, and gabapentin with minimal relief. The patient presents in pain. He underwent an ABI today which was unobtainable due to the patients intolerance to touch and inability to remain stable. He does continue to have prominent bilateral edema which has not improved with compression and elevation. He denies any fever, nausea or vomiting. Denies any SOD or chest pain. No ulceration.  1. Pain in both lower extremities - Worsening Patient with continued bilateral lower extremity pain and edema.  Will apply for lymphedema pump. Unsure of muscle spasm origin. Will prescribe one RX of Oxycodone to help aleve discomfort. 5mg  PO q6 hours PRN: Pain #60  2. Lymphedema - Worsening Despite conservative treatments including exercise, elevation and class one compression stockings, the patient still presents with Stage 2 Lymphedema. He would greatly benefit from a lymphedema pump.  Current Outpatient Prescriptions on File Prior to Visit  Medication Sig Dispense Refill  . allopurinol (ZYLOPRIM) 100 MG tablet Take 100 mg by mouth daily.    Marland Kitchen amLODipine (NORVASC) 5 MG tablet Take 1 tablet (5 mg total) by mouth daily. 30 tablet 0  . Carbomer Gel Base (HYDROGEL) GEL Use.    . ciprofloxacin (CIPRO) 250 MG tablet Take 2 tablets (500 mg total) by mouth daily. 14 tablet 0  . colchicine 0.6 MG tablet Take by mouth.    . cyanocobalamin (,VITAMIN B-12,) 1000 MCG/ML injection Inject 1,000 mcg into the muscle every 30 (thirty) days. 8th of month    . docusate sodium (COLACE) 100 MG capsule Take 1 capsule (100 mg total) by mouth 2 (two) times daily. 10 capsule 0  . DULoxetine (CYMBALTA) 30 MG capsule Take 1 capsule (30 mg total) by mouth daily. 30 capsule 0  . glucose blood (ACCU-CHEK COMPACT PLUS) test strip     . HYDROcodone-acetaminophen (NORCO) 10-325 MG tablet Take 1 tablet by mouth every 6 (six) hours as needed for moderate pain. 20 tablet 0  . Insulin Pen Needle (B-D  ULTRAFINE III SHORT PEN) 31G X 8 MM MISC USE AS DIRECTED    . mirtazapine (REMERON) 30 MG tablet Take 1 tablet (30 mg total) by mouth at bedtime. 30 tablet 0  . omeprazole (PRILOSEC) 20 MG capsule Take 20 mg by mouth daily.     . ondansetron (ZOFRAN) 4 MG tablet Take by mouth.    . ondansetron (ZOFRAN-ODT) 4 MG disintegrating tablet Take 4 mg by mouth every 8 (eight) hours as needed for nausea or vomiting.     . potassium chloride (K-DUR) 10 MEQ tablet Take 10 mEq by mouth daily.    . potassium chloride (K-DUR,KLOR-CON) 10 MEQ tablet Take by mouth.    Marland Kitchen  primidone (MYSOLINE) 50 MG tablet Take 50 mg by mouth 2 (two) times daily.     . ranitidine (ZANTAC) 150 MG tablet Take 300 mg by mouth every evening.     . sitaGLIPtin (JANUVIA) 25 MG tablet Take 25 mg by mouth daily.     . tamsulosin (FLOMAX) 0.4 MG CAPS capsule Take 1 capsule (0.4 mg total) by mouth daily. 90 capsule 3  . torsemide (DEMADEX) 20 MG tablet Take 20 mg by mouth 2 (two) times daily.    . traZODone (DESYREL) 50 MG tablet Take 50 mg by mouth at bedtime.    . Wound Dressings (ALLANTOIN) gel Apply topically as needed for wound care.     No current facility-administered medications on file prior to visit.     There are no Patient Instructions on file for this visit. No Follow-up on file.   Yamato Kopf A Ariella Voit, PA-C

## 2017-03-12 ENCOUNTER — Emergency Department: Payer: Medicare Other

## 2017-03-12 ENCOUNTER — Encounter: Payer: Self-pay | Admitting: Emergency Medicine

## 2017-03-12 ENCOUNTER — Emergency Department
Admission: EM | Admit: 2017-03-12 | Discharge: 2017-03-12 | Disposition: A | Discharge status: Home / Self Care | Payer: Medicare Other | Attending: Emergency Medicine | Admitting: Emergency Medicine

## 2017-03-12 DIAGNOSIS — I10 Essential (primary) hypertension: Secondary | ICD-10-CM | POA: Diagnosis not present

## 2017-03-12 DIAGNOSIS — Z79899 Other long term (current) drug therapy: Secondary | ICD-10-CM | POA: Insufficient documentation

## 2017-03-12 DIAGNOSIS — Z8551 Personal history of malignant neoplasm of bladder: Secondary | ICD-10-CM | POA: Insufficient documentation

## 2017-03-12 DIAGNOSIS — I70203 Unspecified atherosclerosis of native arteries of extremities, bilateral legs: Secondary | ICD-10-CM | POA: Diagnosis not present

## 2017-03-12 DIAGNOSIS — E119 Type 2 diabetes mellitus without complications: Secondary | ICD-10-CM | POA: Diagnosis not present

## 2017-03-12 DIAGNOSIS — Z87891 Personal history of nicotine dependence: Secondary | ICD-10-CM | POA: Insufficient documentation

## 2017-03-12 DIAGNOSIS — I701 Atherosclerosis of renal artery: Secondary | ICD-10-CM | POA: Insufficient documentation

## 2017-03-12 DIAGNOSIS — Z794 Long term (current) use of insulin: Secondary | ICD-10-CM | POA: Diagnosis not present

## 2017-03-12 DIAGNOSIS — M79671 Pain in right foot: Secondary | ICD-10-CM | POA: Diagnosis present

## 2017-03-12 DIAGNOSIS — Z8546 Personal history of malignant neoplasm of prostate: Secondary | ICD-10-CM | POA: Insufficient documentation

## 2017-03-12 DIAGNOSIS — I739 Peripheral vascular disease, unspecified: Secondary | ICD-10-CM

## 2017-03-12 LAB — COMPREHENSIVE METABOLIC PANEL
ALK PHOS: 57 U/L (ref 38–126)
ALT: 9 U/L — AB (ref 17–63)
AST: 16 U/L (ref 15–41)
Albumin: 2.9 g/dL — ABNORMAL LOW (ref 3.5–5.0)
Anion gap: 8 (ref 5–15)
BILIRUBIN TOTAL: 0.3 mg/dL (ref 0.3–1.2)
BUN: 44 mg/dL — AB (ref 6–20)
CALCIUM: 8.4 mg/dL — AB (ref 8.9–10.3)
CO2: 29 mmol/L (ref 22–32)
CREATININE: 2.23 mg/dL — AB (ref 0.61–1.24)
Chloride: 103 mmol/L (ref 101–111)
GFR, EST AFRICAN AMERICAN: 29 mL/min — AB (ref >60)
GFR, EST NON AFRICAN AMERICAN: 25 mL/min — AB (ref >60)
Glucose, Bld: 108 mg/dL — ABNORMAL HIGH (ref 65–99)
Potassium: 4.1 mmol/L (ref 3.5–5.1)
Sodium: 140 mmol/L (ref 135–145)
TOTAL PROTEIN: 6.7 g/dL (ref 6.5–8.1)

## 2017-03-12 LAB — CBC
HEMATOCRIT: 34.6 % — AB (ref 40.0–52.0)
Hemoglobin: 11.2 g/dL — ABNORMAL LOW (ref 13.0–18.0)
MCH: 27.6 pg (ref 26.0–34.0)
MCHC: 32.3 g/dL (ref 32.0–36.0)
MCV: 85.7 fL (ref 80.0–100.0)
Platelets: 354 10*3/uL (ref 150–440)
RBC: 4.04 MIL/uL — ABNORMAL LOW (ref 4.40–5.90)
RDW: 15.7 % — ABNORMAL HIGH (ref 11.5–14.5)
WBC: 6.7 10*3/uL (ref 3.8–10.6)

## 2017-03-12 MED ORDER — MORPHINE SULFATE (PF) 4 MG/ML IV SOLN
INTRAVENOUS | Status: AC
  Administered 2017-03-12: 4 mg via INTRAVENOUS
  Filled 2017-03-12: qty 1

## 2017-03-12 MED ORDER — MORPHINE SULFATE (PF) 2 MG/ML IV SOLN
2.0000 mg | Freq: Once | INTRAVENOUS | Status: AC
  Administered 2017-03-12: 2 mg via INTRAVENOUS
  Filled 2017-03-12: qty 1

## 2017-03-12 MED ORDER — ONDANSETRON HCL 4 MG/2ML IJ SOLN
4.0000 mg | Freq: Once | INTRAMUSCULAR | Status: AC
  Administered 2017-03-12: 4 mg via INTRAVENOUS

## 2017-03-12 MED ORDER — OXYCODONE-ACETAMINOPHEN 7.5-325 MG PO TABS: 1.0000 | ORAL_TABLET | ORAL | 0 refills | Status: DC | PRN

## 2017-03-12 MED ORDER — SODIUM BICARBONATE 8.4 % IV SOLN
INTRAVENOUS | Status: DC
  Filled 2017-03-12: qty 500

## 2017-03-12 MED ORDER — SODIUM BICARBONATE BOLUS VIA INFUSION
INTRAVENOUS | Status: AC
  Administered 2017-03-12: 150 meq via INTRAVENOUS
  Filled 2017-03-12: qty 1

## 2017-03-12 MED ORDER — SODIUM BICARBONATE 8.4 % IV SOLN
INTRAVENOUS | Status: AC
  Administered 2017-03-12: 06:00:00 via INTRAVENOUS
  Filled 2017-03-12: qty 1000

## 2017-03-12 MED ORDER — IOPAMIDOL (ISOVUE-370) INJECTION 76%
100.0000 mL | Freq: Once | INTRAVENOUS | Status: AC | PRN
  Administered 2017-03-12: 100 mL via INTRAVENOUS

## 2017-03-12 MED ORDER — SODIUM BICARBONATE BOLUS VIA INFUSION
INTRAVENOUS | Status: DC
  Filled 2017-03-12: qty 1

## 2017-03-12 MED ORDER — MORPHINE SULFATE (PF) 4 MG/ML IV SOLN
4.0000 mg | Freq: Once | INTRAVENOUS | Status: AC
  Administered 2017-03-12: 4 mg via INTRAVENOUS

## 2017-03-12 MED ORDER — ONDANSETRON HCL 4 MG/2ML IJ SOLN
INTRAMUSCULAR | Status: AC
  Administered 2017-03-12: 4 mg via INTRAVENOUS
  Filled 2017-03-12: qty 2

## 2017-03-12 NOTE — ED Notes (Signed)
E-signature page would not let pt sign so page was printed out and pt signed on paper.

## 2017-03-12 NOTE — ED Notes (Signed)
Pt back from CT scan and is requesting "morphine for pain in feet." MD notified, states will follow up with pt. Pt informed of this and verbalized understanding.

## 2017-03-12 NOTE — ED Notes (Signed)
Called EMS for transport to home [phone]

## 2017-03-12 NOTE — ED Notes (Addendum)
Pt's oxygen saturation sustaining at 86-88% on RA, per wife pt has had to wear oxygen at home before however is not currently on oxygen. Pt placed on 2L of oxygen and saturation rose to 97%.

## 2017-03-12 NOTE — ED Notes (Signed)
Notified CT pt can get CT scan at 6:15, once bolus of bicarb is completed. CT verbalizes understanding of this.

## 2017-03-12 NOTE — ED Notes (Signed)
Pt resting comfortably. Wife in room. Waiting on EMS to transport pt home.

## 2017-03-12 NOTE — ED Notes (Signed)
Patient transported to CT 

## 2017-03-12 NOTE — ED Notes (Addendum)
Pt reports seeing Dr. Melrose Nakayama and had ultrasound done on bilat feet and wife reports "it was fine however his feet were so jumpy they said it was hard to get a read." Pt states "foot spasms" for over a year and reports "tonight's pain is different, the bottom of my feet feel like they want to explode."

## 2017-03-12 NOTE — ED Notes (Signed)
Pt dressed in own pajamas, waiting EMS ride back to [facility]. Pt wife is leaving to go home. Pt still has 1 IV in place, will take out when EMS arrives. Pt hooked up to cardiac monitor to watch VS. Pt denies any needs at this time.

## 2017-03-12 NOTE — ED Notes (Signed)
Sodium bicarb arrived from pharm to ED.

## 2017-03-12 NOTE — ED Provider Notes (Signed)
[facility] Emergency Department Provider Note   First MD Initiated Contact with Patient 03/12/17 0245     (approximate)  I have reviewed the triage vital signs and the nursing notes.   HISTORY  Chief Complaint Foot Pain    HPI Dean Garcia is a 83 y.o. male with below list of chronic medical conditions including atrial fibrillation, renal insufficiency presents to the emergency department via EMS with bilateral lower extremity with current pain score 10 out of 10 that the patient describes as "pulsating and releasing". Patient states that he's had this discomfort for "a while. Patient states that he seen both a podiatrist and vascular surgery without any diagnosis given. Patient states his pain is worse with ambulation denies any alleviating factors.   Past Medical History:  Diagnosis Date  . Atrial fibrillation ([place])   . Bladder cancer ([place])   . Bundle branch block, right   . Carotid stenosis   . Colon polyp   . CVA (cerebral infarction)   . Diabetes ([place])   . Gout   . High cholesterol   . Hypertension   . Mitral valve prolapse   . Prostate cancer ([place])   . Tremor     Patient Active Problem List   Diagnosis Date Noted  . Lymphedema 03/08/2017  . Epididymitis 02/22/2017  . Pain in limb 02/07/2017  . Hypoglycemia 01/19/2017  . Depression 01/19/2017  . Hypoglycemia due to insulin 01/19/2017  . Muscle weakness (generalized)   . SOB (shortness of breath)   . Palliative care by specialist   . HCAP (healthcare-associated pneumonia) 11/29/2016  . Pressure injury of skin 11/20/2016  . Palliative care encounter   . Goals of care, counseling/discussion   . Acute renal failure superimposed on chronic kidney disease ([place])   . ARF (acute renal failure) ([place]) 11/19/2016  . Moderate major depression, single episode ([place]) 10/22/2016  . Mild dementia 10/22/2016  . Paroxysmal atrial fibrillation ([place]) 02/21/2016  . A-fib ([place]) 02/12/2016  .  Centrilobular emphysema ([place]) 12/27/2015  . B12 deficiency 04/12/2015  . Atherosclerosis of aorta ([place]) 10/23/2014  . 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 Has a tremor 07/17/2014  . Type 2 diabetes mellitus ([place]) 07/17/2014  . Edema 07/17/2014  . Billowing mitral valve 07/12/2014  . BP (high blood pressure) 07/12/2014  . Carotid artery narrowing 07/12/2014  . Impaired renal function 06/07/2014  . Proctitis, radiation 06/07/2014  . Malignant neoplasm of prostate ([place]) 06/07/2014  . Peripheral nerve disease ([place]) 06/07/2014  . DJD (degenerative joint disease) 06/07/2014  . Disorder of peripheral nervous system ([place]) 06/07/2014  . Gout 06/07/2014  . 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 DD (diverticular disease) 06/07/2014  . Disease of rectum 06/07/2014  . Diabetes mellitus ([place]) 06/07/2014  . Malignant neoplasm of urinary bladder ([place]) 06/07/2014    Past Surgical History:  Procedure Laterality Date  . APPENDECTOMY    . CAROTID ARTERY ANGIOPLASTY    . CHOLECYSTECTOMY      Prior to Admission medications   Medication Sig Start Date End Date Taking? Authorizing Provider  allopurinol (ZYLOPRIM) 100 MG tablet Take 100 mg by mouth daily.   Yes Historical Provider, MD  colchicine 0.6 MG tablet Take 0.6 mg by mouth 2 (two) times daily as needed (gout).    Yes Historical Provider, MD  cyanocobalamin (,VITAMIN B-12,) 1000 MCG/ML injection Inject 1,000 mcg into the muscle every 30 (thirty) days. 8th of month 10/31/15  Yes Historical Provider, MD  docusate sodium (COLACE) 100  MG capsule Take 1 capsule (100 mg total) by mouth 2 (two) times daily. 01/24/17  Yes Vaughan Basta, MD  fexofenadine (ALLEGRA) 180 MG tablet Take 180 mg by mouth daily as needed for allergies or rhinitis.   Yes Historical Provider, MD  glucose blood (ACCU-CHEK COMPACT PLUS) test strip  12/07/15  Yes Historical Provider, MD  HYDROcodone-acetaminophen (NORCO) 10-325 MG tablet Take 1 tablet by  mouth every 6 (six) hours as needed for moderate pain. 01/24/17  Yes Vaughan Basta, MD  Insulin Pen Needle (B-D ULTRAFINE III SHORT PEN) 31G X 8 MM MISC USE AS DIRECTED 12/24/16  Yes Historical Provider, MD  magnesium oxide (MAG-OX) 400 MG tablet Take 400 mg by mouth every evening.   Yes Historical Provider, MD  mirtazapine (REMERON) 30 MG tablet Take 1 tablet (30 mg total) by mouth at bedtime. 01/24/17  Yes Vaughan Basta, MD  omeprazole (PRILOSEC) 20 MG capsule Take 20 mg by mouth daily.   Yes Historical Provider, MD  ondansetron (ZOFRAN-ODT) 4 MG disintegrating tablet Take 4 mg by mouth every 8 (eight) hours as needed for nausea or vomiting.    Yes Historical Provider, MD  potassium chloride (K-DUR) 10 MEQ tablet Take 10 mEq by mouth daily.   Yes Historical Provider, MD  primidone (MYSOLINE) 50 MG tablet Take 50 mg by mouth 2 (two) times daily.    Yes Historical Provider, MD  ranitidine (ZANTAC) 150 MG tablet Take 300 mg by mouth every evening.    Yes Historical Provider, MD  sitaGLIPtin (JANUVIA) 25 MG tablet Take 25 mg by mouth daily.    Yes Historical Provider, MD  tamsulosin (FLOMAX) 0.4 MG CAPS capsule Take 1 capsule (0.4 mg total) by mouth daily. 01/07/17  Yes Shannon A McGowan, PA-C  torsemide (DEMADEX) 20 MG tablet Take 20 mg by mouth 2 (two) times daily.   Yes Historical Provider, MD  traZODone (DESYREL) 50 MG tablet Take 50 mg by mouth at bedtime.   Yes Historical Provider, MD  amLODipine (NORVASC) 5 MG tablet Take 1 tablet (5 mg total) by mouth daily. Patient not taking: Reported on 03/12/2017 01/25/17   Vaughan Basta, MD  Carbomer Gel Base (HYDROGEL) GEL Use.    Historical Provider, MD  DULoxetine (CYMBALTA) 30 MG capsule Take 1 capsule (30 mg total) by mouth daily. Patient not taking: Reported on 03/12/2017 01/25/17   Vaughan Basta, MD  Wound Dressings (ALLANTOIN) gel Apply topically as needed for wound care.    Historical Provider, MD    Allergies Demerol  [meperidine]  Family History  Problem Relation Age of Onset  . Hypertension Father   . Stroke Father   . Diabetes Mother   . Prostate cancer Neg Hx   . Kidney cancer Neg Hx   . Kidney disease Neg Hx   . Bladder Cancer Neg Hx     Social History Social History  Substance Use Topics  . Smoking status: Former Smoker    Types: Pipe    Quit date: 10/29/2010  . Smokeless tobacco: Never Used  . Alcohol use Yes     Comment: 1 beer a day    Review of Systems Constitutional: No fever/chills Eyes: No visual changes. ENT: No sore throat. Cardiovascular: Denies chest pain. Respiratory: Denies shortness of breath. Gastrointestinal: No abdominal pain.  No nausea, no vomiting.  No diarrhea.  No constipation. Genitourinary: Negative for dysuria. Musculoskeletal: Negative for back pain. Positive for bilateral lower extremity pain Skin: Negative for rash. Neurological: Negative for headaches, focal weakness or  numbness.  10-point ROS otherwise negative.  ____________________________________________   PHYSICAL EXAM:  VITAL SIGNS: ED Triage Vitals  Enc Vitals Group     BP 03/12/17 0247 (!) 160/81     Pulse Rate 03/12/17 0247 (!) 43     Resp 03/12/17 0247 18     Temp 03/12/17 0247 98.5 F (36.9 C)     Temp Source 03/12/17 0247 Oral     SpO2 03/12/17 0247 97 %     Weight 03/12/17 0247 170 lb (77.1 kg)     Height 03/12/17 0247 6\' 1"  (1.854 m)     Head Circumference --      Peak Flow --      Pain Score 03/12/17 0246 10     Pain Loc --      Pain Edu? --      Excl. in [place]? --     Constitutional: Alert and oriented. Well appearing and in no acute distress. Eyes: Conjunctivae are normal. PERRL. EOMI. Head: Atraumatic. Mouth/Throat: Mucous membranes are moist.  Oropharynx non-erythematous. Neck: No stridor.  Cardiovascular: Normal rate, regular rhythm. Good peripheral circulation. Grossly normal heart sounds. Respiratory: Normal respiratory effort.  No retractions. Lungs  CTAB. Gastrointestinal: Soft and nontender. No distention.  Musculoskeletal: No lower extremity tenderness nor edema. No gross deformities of extremities. Neurologic:  Normal speech and language. No gross focal neurologic deficits are appreciated.  Skin:  Skin is warm, dry and intact. Rash consistent with peripheral artery disease can noted bilateral anterior lower leg left worse than right. Psychiatric: Mood and affect are normal. Speech and behavior are normal.  ____________________________________________   LABS (all labs ordered are listed, but only abnormal results are displayed)  Labs Reviewed  CBC - Abnormal; Notable for the following:       Result Value   RBC 4.04 (*)    Hemoglobin 11.2 (*)    HCT 34.6 (*)    RDW 15.7 (*)    All other components within normal limits  COMPREHENSIVE METABOLIC PANEL - Abnormal; Notable for the following:    Glucose, Bld 108 (*)    BUN 44 (*)    Creatinine, Ser 2.23 (*)    Calcium 8.4 (*)    Albumin 2.9 (*)    ALT 9 (*)    GFR calc non Af Amer 25 (*)    GFR calc Af Amer 29 (*)    All other components within normal limits       Procedures   ____________________________________________   INITIAL IMPRESSION / ASSESSMENT AND PLAN / ED COURSE  Pertinent labs & imaging results that were available during my care of the patient were reviewed by me and considered in my medical decision making (see chart for details).  Patient given morphine 4 mg on arrival to the emergency department with improvement in pain. Given history and physical exam concern for possible peripheral artery disease and a such we'll obtain CT angiogram aortobifem  Given patient's renal insufficiency will premedicate before CT scan was performed patient discussed with the radiologist.  Awaiting CT angio interpretation. Patient's care transferred to Dr. Jimmye Norman with      ____________________________________________  FINAL CLINICAL IMPRESSION(S) / ED  DIAGNOSES  Final diagnoses:  Peripheral artery disease ([place])     MEDICATIONS GIVEN DURING THIS VISIT:  Medications  morphine 4 MG/ML injection 4 mg (4 mg Intravenous Given 03/12/17 0320)  ondansetron (ZOFRAN) injection 4 mg (4 mg Intravenous Given 03/12/17 0320)  sodium bicarbonate first hour bolus via infusion (150 mEq  Intravenous Bolus from Bag 03/12/17 0515)    And  sodium bicarbonate 150 mEq in dextrose 5 % 1,000 mL infusion ( Intravenous New Bag/Given 03/12/17 0615)  iopamidol (ISOVUE-370) 76 % injection 100 mL (100 mLs Intravenous Contrast Given 03/12/17 0633)  morphine 2 MG/ML injection 2 mg (2 mg Intravenous Given 03/12/17 0732)     NEW OUTPATIENT MEDICATIONS STARTED DURING THIS VISIT:  New Prescriptions   No medications on file    Modified Medications   No medications on file    Discontinued Medications   OMEPRAZOLE (PRILOSEC) 20 MG CAPSULE    Take 20 mg by mouth daily.    ONDANSETRON (ZOFRAN) 4 MG TABLET    Take by mouth.   POTASSIUM CHLORIDE (K-DUR,KLOR-CON) 10 MEQ TABLET    Take by mouth.     Note:  This document was prepared using Dragon voice recognition software and may include unintentional dictation errors.    Gregor Hams, MD 03/12/17 [phone]

## 2017-03-12 NOTE — ED Provider Notes (Addendum)
IMPRESSION: VASCULAR  Aortic atherosclerosis. Atherosclerosis is noted throughout the visualized vessels of the lower extremities. 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  No significant stenosis is noted involving the abdominal aorta, iliac, femoral or popliteal arteries. However, there is complete occlusion of the proximal portions of the posterior tibial and peroneal arteries bilaterally, with 1 vessel runoff noted bilaterally through patent anterior tibial arteries.  Severe stenoses are noted at the origins of both renal artery secondary to calcified atherosclerotic plaque.  NON-VASCULAR  Mild bilateral pleural effusions.  Moderate size hiatal hernia.  Sigmoid diverticulosis without inflammation.  2.2 cm low density seen in left hepatic lobe which is significantly increased in size compared to prior exam. Further evaluation with MRI with and without gadolinium administration is recommended.  Patient with peripheral arterial disease as suspected. He is stable for outpatient follow-up with vascular surgery. Patient has been encouraged to restart his Eliquis   Earleen Newport, MD 03/12/17 [place], MD 03/12/17 [phone]

## 2017-03-12 NOTE — ED Triage Notes (Signed)
Patient to ER via [facility] from independent living section of [facility] for foot pain to both feet. Per EMS, patient originally described pain as foot spasms, but upon arrival, patient states "it's not spasms this time, it's like a pulsating and releasing pain". Patient in no acute distress at this time.

## 2017-03-14 ENCOUNTER — Ambulatory Visit (INDEPENDENT_AMBULATORY_CARE_PROVIDER_SITE_OTHER): Payer: Medicare Other | Admitting: Vascular Surgery

## 2017-03-14 ENCOUNTER — Encounter (INDEPENDENT_AMBULATORY_CARE_PROVIDER_SITE_OTHER): Payer: Self-pay | Admitting: Vascular Surgery

## 2017-03-14 VITALS — BP 135/51 | HR 51 | Resp 16 | Ht 71.0 in

## 2017-03-14 DIAGNOSIS — I10 Essential (primary) hypertension: Secondary | ICD-10-CM

## 2017-03-14 DIAGNOSIS — I7090 Unspecified atherosclerosis: Secondary | ICD-10-CM

## 2017-03-14 DIAGNOSIS — I723 Aneurysm of iliac artery: Secondary | ICD-10-CM

## 2017-03-14 DIAGNOSIS — I4891 Unspecified atrial fibrillation: Secondary | ICD-10-CM

## 2017-03-14 DIAGNOSIS — I6523 Occlusion and stenosis of bilateral carotid arteries: Secondary | ICD-10-CM

## 2017-03-14 DIAGNOSIS — M79604 Pain in right leg: Secondary | ICD-10-CM

## 2017-03-14 DIAGNOSIS — M79605 Pain in left leg: Secondary | ICD-10-CM | POA: Diagnosis not present

## 2017-03-14 NOTE — Progress Notes (Signed)
MRN : [id number]  Dean Garcia is a 83 y.o. (10-Jun-1928) male who presents with chief complaint of  Chief Complaint  Patient presents with  . Re-evaluation    CT results  .  History of Present Illness: The patient returns to the office for followup and review of the CT angiogram. There have been no interval changes in lower extremity symptoms. No interval shortening of the patient's claudication distance. No new ulcers or wounds have occurred since the last visit.  There have been no significant changes to the patient's overall health care.  The patient denies amaurosis fugax or recent TIA symptoms. There are no recent neurological changes noted. The patient denies history of DVT, PE or superficial thrombophlebitis. The patient denies recent episodes of angina or shortness of breath.    Current Meds  Medication Sig  . allopurinol (ZYLOPRIM) 100 MG tablet Take 100 mg by mouth daily.  Marland Kitchen amLODipine (NORVASC) 5 MG tablet Take 1 tablet (5 mg total) by mouth daily.  . Carbomer Gel Base (HYDROGEL) GEL Use.  . colchicine 0.6 MG tablet Take 0.6 mg by mouth 2 (two) times daily as needed (gout).   . cyanocobalamin (,VITAMIN B-12,) 1000 MCG/ML injection Inject 1,000 mcg into the muscle every 30 (thirty) days. 8th of month  . docusate sodium (COLACE) 100 MG capsule Take 1 capsule (100 mg total) by mouth 2 (two) times daily.  . DULoxetine (CYMBALTA) 30 MG capsule Take 1 capsule (30 mg total) by mouth daily.  . fexofenadine (ALLEGRA) 180 MG tablet Take 180 mg by mouth daily as needed for allergies or rhinitis.  Marland Kitchen glucose blood (ACCU-CHEK COMPACT PLUS) test strip   . HYDROcodone-acetaminophen (NORCO) 10-325 MG tablet Take 1 tablet by mouth every 6 (six) hours as needed for moderate pain.  . Insulin Pen Needle (B-D ULTRAFINE III SHORT PEN) 31G X 8 MM MISC USE AS DIRECTED  . magnesium oxide (MAG-OX) 400 MG tablet Take 400 mg by mouth every evening.  . mirtazapine (REMERON) 30 MG tablet Take 1 tablet  (30 mg total) by mouth at bedtime.  Marland Kitchen omeprazole (PRILOSEC) 20 MG capsule Take 20 mg by mouth daily.  . ondansetron (ZOFRAN-ODT) 4 MG disintegrating tablet Take 4 mg by mouth every 8 (eight) hours as needed for nausea or vomiting.   Marland Kitchen oxyCODONE-acetaminophen (PERCOCET) 7.5-325 MG tablet Take 1 tablet by mouth every 4 (four) hours as needed for severe pain.  . potassium chloride (K-DUR) 10 MEQ tablet Take 10 mEq by mouth daily.  . primidone (MYSOLINE) 50 MG tablet Take 50 mg by mouth 2 (two) times daily.   . ranitidine (ZANTAC) 150 MG tablet Take 300 mg by mouth every evening.   . sitaGLIPtin (JANUVIA) 25 MG tablet Take 25 mg by mouth daily.   . tamsulosin (FLOMAX) 0.4 MG CAPS capsule Take 1 capsule (0.4 mg total) by mouth daily.  Marland Kitchen torsemide (DEMADEX) 20 MG tablet Take 20 mg by mouth 2 (two) times daily.  . traZODone (DESYREL) 50 MG tablet Take 50 mg by mouth at bedtime.  . Wound Dressings (ALLANTOIN) gel Apply topically as needed for wound care.    Past Medical History:  Diagnosis Date  . Atrial fibrillation ([place])   . Bladder cancer ([place])   . Bundle branch block, right   . Carotid stenosis   . Colon polyp   . CVA (cerebral infarction)   . Diabetes ([place])   . Gout   . High cholesterol   . Hypertension   .  Mitral valve prolapse   . Prostate cancer ([place])   . Tremor     Past Surgical History:  Procedure Laterality Date  . APPENDECTOMY    . CAROTID ARTERY ANGIOPLASTY    . CHOLECYSTECTOMY      Social History Social History  Substance Use Topics  . Smoking status: Former Smoker    Types: Pipe    Quit date: 10/29/2010  . Smokeless tobacco: Never Used  . Alcohol use Yes     Comment: 1 beer a day    Family History Family History  Problem Relation Age of Onset  . Hypertension Father   . Stroke Father   . Diabetes Mother   . Prostate cancer Neg Hx   . Kidney cancer Neg Hx   . Kidney disease Neg Hx   . Bladder Cancer Neg Hx     Allergies  Allergen Reactions  .  Demerol [Meperidine] Nausea And Vomiting and Nausea Only    Other reaction(s): Nausea And Vomiting, Vomiting Nausea/vomiting     REVIEW OF SYSTEMS (Negative unless checked)  Constitutional: [] Weight loss  [] Fever  [] Chills Cardiac: [] Chest pain   [] Chest pressure   [] Palpitations   [] Shortness of breath when laying flat   [] Shortness of breath with exertion. Vascular:  [x] Pain in legs with walking   [x] Pain in legs at rest  [] History of DVT   [] Phlebitis   [] Swelling in legs   [] Varicose veins   [] Non-healing ulcers Pulmonary:   [] Uses home oxygen   [] Productive cough   [] Hemoptysis   [] Wheeze  [] COPD   [] Asthma Neurologic:  [] Dizziness   [] Seizures   [] History of stroke   [] History of TIA  [] Aphasia   [] Vissual changes   [] Weakness or numbness in arm   [] Weakness or numbness in leg Musculoskeletal:   [] Joint swelling   [] Joint pain   [] Low back pain Hematologic:  [] Easy bruising  [] Easy bleeding   [] Hypercoagulable state   [] Anemic Gastrointestinal:  [] Diarrhea   [] Vomiting  [] Gastroesophageal reflux/heartburn   [] Difficulty swallowing. Genitourinary:  [] Chronic kidney disease   [] Difficult urination  [] Frequent urination   [] Blood in urine Skin:  [] Rashes   [] Ulcers  Psychological:  [] History of anxiety   []  History of major depression.  Physical Examination  Vitals:   03/14/17 1119  BP: (!) 135/51  Pulse: (!) 51  Resp: 16  Height: 5\' 11"  (1.803 m)   There is no height or weight on file to calculate BMI. Gen: WD/WN, NAD Head: [place]/AT, No temporalis wasting.  Ear/Nose/Throat: Hearing grossly intact, nares w/o erythema or drainage, poor dentition Eyes: PER, EOMI, sclera nonicteric.  Neck: Supple, no masses.  No bruit or JVD.  Pulmonary:  Good air movement, clear to auscultation bilaterally, no use of accessory muscles.  Cardiac: RRR, normal S1, S2, no Murmurs. Vascular: brisk cap refill 1+ pitting edema Vessel Right Left  Radial Palpable Palpable  Ulnar Palpable Palpable    Brachial Palpable Palpable  Carotid Palpable Palpable  Femoral Palpable Palpable  Popliteal Palpable Palpable  PT Trace Palpable Trace Palpable  DP Trace Palpable Trace Palpable  Gastrointestinal: soft, non-distended. No guarding/no peritoneal signs.  Musculoskeletal: M/S 5/5 throughout.  No deformity or atrophy.  Neurologic: CN 2-12 intact. Pain and light touch intact in extremities.  Symmetrical.  Speech is fluent. Motor exam as listed above. Psychiatric: Judgment intact, Mood & affect appropriate for pt's clinical situation. Dermatologic: No rashes or ulcers noted.  No changes consistent with cellulitis. Lymph : No Cervical lymphadenopathy, no lichenification  or skin changes of chronic lymphedema.  CBC Lab Results  Component Value Date   WBC 6.7 03/12/2017   HGB 11.2 (L) 03/12/2017   HCT 34.6 (L) 03/12/2017   MCV 85.7 03/12/2017   PLT 354 03/12/2017    BMET    Component Value Date/Time   NA 140 03/12/2017 0313   NA 140 03/02/2015 2019   K 4.1 03/12/2017 0313   K 3.1 (L) 03/02/2015 2019   CL 103 03/12/2017 0313   CL 98 (L) 03/02/2015 2019   CO2 29 03/12/2017 0313   CO2 29 03/02/2015 2019   GLUCOSE 108 (H) 03/12/2017 0313   GLUCOSE 149 (H) 03/02/2015 2019   BUN 44 (H) 03/12/2017 0313   BUN 62 (H) 03/02/2015 2019   CREATININE 2.23 (H) 03/12/2017 0313   CREATININE 2.14 (H) 03/02/2015 2019   CALCIUM 8.4 (L) 03/12/2017 0313   CALCIUM 8.1 (L) 03/02/2015 2019   GFRNONAA 25 (L) 03/12/2017 0313   GFRNONAA 27 (L) 03/02/2015 2019   GFRAA 29 (L) 03/12/2017 0313   GFRAA 32 (L) 03/02/2015 2019   Estimated Creatinine Clearance: 25.3 mL/min (A) (by C-G formula based on SCr of 2.23 mg/dL (H)).  COAG Lab Results  Component Value Date   INR 1.00 02/22/2017   INR 1.06 02/14/2016   INR 1.01 02/12/2016    Radiology Ct Pelvis Wo Contrast  Result Date: 02/26/2017 CLINICAL DATA:  Perineal pain, recent prostatitis EXAM: CT PELVIS WITHOUT CONTRAST TECHNIQUE: Multidetector CT  imaging of the pelvis was performed following the standard protocol without intravenous contrast. COMPARISON:  CT scan 03/02/2015 FINDINGS: Urinary Tract: There is a Foley catheter within decompressed urinary bladder. Mild thickening of urinary bladder wall wall. Mild cystitis or chronic inflammation cannot be excluded. Tiny foci of air within anterior aspect of urinary bladder could represent infection or recent instrumentation. No definite blood products are identified within urinary bladder. Bowel: Moderate stool noted within visualized right colon. The terminal ileum is unremarkable. Some colonic stool noted within distending colon and sigmoid colon. Multiple sigmoid colon diverticula. No evidence of acute diverticulitis or acute colitis. No distal colonic obstruction. Visualized distal small bowel loops are unremarkable. Vascular/Lymphatic: Atherosclerotic calcifications bilateral iliac arteries are noted. There is aneurysmal dilatation of left iliac artery measures 2.1 cm in diameter Reproductive:  Prostate gland is unremarkable. Other: Small bilateral hydrocele. There is mild subcutaneous edema/ fluid along the scrotal wall. No pelvic free air or pelvic ascites. Musculoskeletal: No destructive bony lesions are noted. Mild degenerative changes bilateral SI joints. Degenerative changes are noted lumbar spine. Coronal images shows mild degenerative changes bilateral hip joint IMPRESSION: 1. There is a Foley catheter within decompressed urinary bladder. Thickening of urinary bladder wall may be due to chronic inflammation or cystitis. Small foci of air within anterior aspect urinary bladder may be due to infection or recent instrumentation. 2. Sigmoid colon diverticula are noted. No evidence of acute diverticulitis. No colonic obstruction. Visualized distal small bowel is unremarkable. 3. Small bilateral hydrocele. Small edema/fluid noted along the scrotal wall. No significant scrotal distension. No scrotal  air-fluid levels. Electronically Signed   By: Lahoma Crocker M.D.   On: 02/26/2017 20:08   US Scrotum  Result Date: 02/20/2017 CLINICAL DATA:  Bilateral testicular pain EXAM: SCROTAL ULTRASOUND DOPPLER ULTRASOUND OF THE TESTICLES TECHNIQUE: Complete ultrasound examination of the testicles, epididymis, and other scrotal structures was performed. Color and spectral Doppler ultrasound were also utilized to evaluate blood flow to the testicles. COMPARISON:  None. FINDINGS: Right testicle Measurements: 2.9  x 1.8 x 2.3 cm. No mass or microlithiasis visualized. Left testicle Measurements: 2.9 x 2.0 x 2.0 cm. No mass or microlithiasis visualized. Right epididymis:  Normal in size and appearance. Left epididymis:  Normal in size and appearance. Hydrocele:  Moderate bilateral Varicocele:  None visualized. Pulsed Doppler interrogation of both testes demonstrates normal low resistance arterial and venous waveforms bilaterally. IMPRESSION: No evidence of testicular mass or torsion. Moderate bilateral hydroceles. Electronically Signed   By: Rolm Baptise M.D.   On: 02/20/2017 08:51   Ct Angio Aortobifemoral W And/or Wo Contrast  Result Date: 03/12/2017 CLINICAL DATA:  Bilateral lower extremity pain. EXAM: CT ANGIOGRAPHY OF ABDOMINAL AORTA WITH ILIOFEMORAL RUNOFF TECHNIQUE: Multidetector CT imaging of the abdomen, pelvis and lower extremities was performed using the standard protocol during bolus administration of intravenous contrast. Multiplanar CT image reconstructions and MIPs were obtained to evaluate the vascular anatomy. CONTRAST:  100 mL Isovue 370 intravenously. COMPARISON:  CT scan of March 02, 2015. FINDINGS: VASCULAR Aorta: Atherosclerosis of abdominal aorta is noted without aneurysm or dissection. Celiac: Patent without evidence of aneurysm, dissection, vasculitis or significant stenosis. SMA: Patent without evidence of aneurysm, dissection, vasculitis or significant stenosis. Renals: Calcified atherosclerotic  plaque is seen at the origin the right renal artery resulting in severe stenosis. There also appears to be severe stenosis at the origin of the left renal artery, also secondary to calcified atherosclerotic plaque. IMA: Appears to be patent, but appears to have severe stenosis at the origins secondary to aortic wall calcifications. Lower extremities. Inflow: No significant stenosis is noted. Atherosclerosis of iliac arteries is noted. 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 Outflow: Common, superficial and profunda femoral arteries and the popliteal artery are patent without evidence of aneurysm, dissection, vasculitis or significant stenosis. However, atherosclerotic calcifications are noted throughout the courses of these vessels. Runoff: There is complete occlusion at the origins of the right posterior tibial and peroneal branches. The left anterior tibial artery flows to the ankle mortise. Atherosclerotic calcifications are noted throughout these vessels. Complete occlusion is noted at the origin of the left peroneal and posterior tibial arteries. The left anterior tibial artery is seen flowing toward the ankle mortise. Atherosclerotic calcifications are noted throughout these vessels. NON-VASCULAR Lower chest: Mild bilateral pleural effusions are noted. Moderate size hiatal hernia is noted. Hepatobiliary: Status post cholecystectomy. Stable right hepatic cysts are noted. 2.2 cm low density is noted in left hepatic lobe which is significantly increased in size compared to prior exam. Pancreas: Unremarkable. No pancreatic ductal dilatation or surrounding inflammatory changes. Spleen: Normal in size without focal abnormality. Adrenals/Urinary Tract: Adrenal glands are unremarkable. Stable bilateral large renal cysts are noted. No hydronephrosis or renal obstruction is noted. No renal or ureteral calculi are noted. Mild urinary bladder distention is noted.  Stomach/Bowel: Sigmoid diverticulosis is noted without inflammation. There is no evidence of bowel obstruction. Lymphatic: Aortic atherosclerosis. No enlarged abdominal or pelvic lymph nodes. Reproductive: Prostate is unremarkable. Other: Mild to moderate subcutaneous edema is noted in both lower extremities. Musculoskeletal: No acute or significant osseous findings. IMPRESSION: VASCULAR Aortic atherosclerosis. Atherosclerosis is noted throughout the visualized vessels of the lower extremities. 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 No significant stenosis is noted involving the abdominal aorta, iliac, femoral or popliteal arteries. However, there is complete occlusion of the proximal portions of the posterior tibial and peroneal arteries bilaterally, with 1 vessel runoff noted bilaterally through patent  anterior tibial arteries. Severe stenoses are noted at the origins of both renal artery secondary to calcified atherosclerotic plaque. NON-VASCULAR Mild bilateral pleural effusions. Moderate size hiatal hernia. Sigmoid diverticulosis without inflammation. 2.2 cm low density seen in left hepatic lobe which is significantly increased in size compared to prior exam. Further evaluation with MRI with and without gadolinium administration is recommended. Electronically Signed   By: Marijo Conception, M.D.   On: 03/12/2017 07:45   [place] Art/ven Flow Abd Pelv Doppler  Result Date: 02/20/2017 CLINICAL DATA:  Bilateral testicular pain EXAM: SCROTAL ULTRASOUND DOPPLER ULTRASOUND OF THE TESTICLES TECHNIQUE: Complete ultrasound examination of the testicles, epididymis, and other scrotal structures was performed. Color and spectral Doppler ultrasound were also utilized to evaluate blood flow to the testicles. COMPARISON:  None. FINDINGS: Right testicle Measurements: 2.9 x 1.8 x 2.3 cm. No mass or microlithiasis visualized. Left testicle Measurements: 2.9 x 2.0 x 2.0 cm. No mass or microlithiasis  visualized. Right epididymis:  Normal in size and appearance. Left epididymis:  Normal in size and appearance. Hydrocele:  Moderate bilateral Varicocele:  None visualized. Pulsed Doppler interrogation of both testes demonstrates normal low resistance arterial and venous waveforms bilaterally. IMPRESSION: No evidence of testicular mass or torsion. Moderate bilateral hydroceles. Electronically Signed   By: Rolm Baptise M.D.   On: 02/20/2017 08:51    Assessment/Plan 1. Pain in both lower extremities Recommend:  I do not find evidence of Vascular pathology that would explain the patient's symptoms  The patient has atypical pain symptoms for vascular disease  Noninvasive studies including venous ultrasound of the legs do not identify vascular problems  The patient should continue walking and begin a more formal exercise program. The patient should continue his antiplatelet therapy and aggressive treatment of the lipid abnormalities. The patient should begin wearing graduated compression socks 15-20 mmHg strength to control her mild edema.  Patient will follow-up with me as ordered  Further work-up of his lower extremity pain is deferred to the primary service which is likely secondary to LS spine disease    2. ASO (arteriosclerosis obliterans)  Recommend:  The patient has evidence of atherosclerosis of the lower extremities with claudication.  The patient does not voice lifestyle limiting changes at this point in time.  Noninvasive studies do not suggest clinically significant change.  No invasive studies, angiography or surgery at this time The patient should continue walking and begin a more formal exercise program.  The patient should continue antiplatelet therapy and aggressive treatment of the lipid abnormalities  No changes in the patient's medications at this time  The patient should continue wearing graduated compression socks 10-15 mmHg strength to control the mild edema.    3. Iliac artery aneurysm (HCC) Continue to follow the aneurysm with duplex  4. Atrial fibrillation, unspecified type ([place]) Continue antiarrhythmia medications as already ordered, these medications have been reviewed and there are no changes at this time.  Continue anticoagulation as ordered by Cardiology Service   5. Essential hypertension Continue antihypertensive medications as already ordered, these medications have been reviewed and there are no changes at this time.   6. Bilateral carotid artery stenosis Recommend:  Given the patient's asymptomatic subcritical stenosis no further invasive testing or surgery at this time.  Continue antiplatelet therapy as prescribed Continue management of CAD, HTN and Hyperlipidemia Healthy heart diet,  encouraged exercise at least 4 times per week Follow up in 12 months with duplex ultrasound and physical exam based on <50% stenosis of the bilateral  carotid artery        Hortencia Pilar, MD  03/14/2017 5:34 PM

## 2017-03-18 ENCOUNTER — Telehealth (INDEPENDENT_AMBULATORY_CARE_PROVIDER_SITE_OTHER): Payer: Self-pay | Admitting: Vascular Surgery

## 2017-03-18 DIAGNOSIS — I5032 Chronic diastolic (congestive) heart failure: Secondary | ICD-10-CM

## 2017-03-18 DIAGNOSIS — E1142 Type 2 diabetes mellitus with diabetic polyneuropathy: Secondary | ICD-10-CM

## 2017-03-18 DIAGNOSIS — M6281 Muscle weakness (generalized): Secondary | ICD-10-CM | POA: Diagnosis not present

## 2017-03-18 DIAGNOSIS — I480 Paroxysmal atrial fibrillation: Secondary | ICD-10-CM

## 2017-03-18 DIAGNOSIS — F39 Unspecified mood [affective] disorder: Secondary | ICD-10-CM | POA: Diagnosis not present

## 2017-03-18 NOTE — Telephone Encounter (Signed)
Patient's wife, Izora Gala, called to ask a few questions about the lymph pump. [phone]

## 2017-03-19 ENCOUNTER — Telehealth: Payer: Self-pay | Admitting: Internal Medicine

## 2017-03-19 NOTE — Telephone Encounter (Signed)
Wife called- pt is a patient at [facility] - wife is requesting a low back MRI, possibly with sedation.  Wife talked with patient's primary care and were told that he would need an office visit to obtain documentation. The wife states the patient is unable to go to his pcp due to his health condition and being in nursing care and is asking if you could put in the referral for MRI  cb number is  [phone] Thanks

## 2017-03-19 NOTE — Telephone Encounter (Signed)
Discussed with her I am fairly certain that he has severe lumbar spinal stenosis. The MRI is unlikely to lead to any change in plan and will be difficult to get. We decided to work with him there--PT and OT. If he doesn't have any improvement--and is likely to need long term SNF--will go ahead with MRI to be sure we are not missing anything

## 2017-03-20 ENCOUNTER — Inpatient Hospital Stay
Admission: EM | Admit: 2017-03-20 | Discharge: 2017-03-25 | DRG: 871 | Disposition: A | Discharge status: Skilled Nursing Facility | Payer: Medicare Other | Attending: Internal Medicine | Admitting: Internal Medicine

## 2017-03-20 ENCOUNTER — Telehealth: Payer: Self-pay

## 2017-03-20 ENCOUNTER — Telehealth: Payer: Self-pay | Admitting: Internal Medicine

## 2017-03-20 ENCOUNTER — Encounter: Payer: Self-pay | Admitting: Emergency Medicine

## 2017-03-20 ENCOUNTER — Inpatient Hospital Stay: Payer: Medicare Other

## 2017-03-20 ENCOUNTER — Inpatient Hospital Stay
Admit: 2017-03-20 | Discharge: 2017-03-20 | Disposition: A | Discharge status: Home / Self Care | Payer: Medicare Other | Attending: Internal Medicine | Admitting: Internal Medicine

## 2017-03-20 DIAGNOSIS — I493 Ventricular premature depolarization: Secondary | ICD-10-CM | POA: Diagnosis present

## 2017-03-20 DIAGNOSIS — R4182 Altered mental status, unspecified: Secondary | ICD-10-CM

## 2017-03-20 DIAGNOSIS — R0681 Apnea, not elsewhere classified: Secondary | ICD-10-CM | POA: Diagnosis present

## 2017-03-20 DIAGNOSIS — M1990 Unspecified osteoarthritis, unspecified site: Secondary | ICD-10-CM | POA: Diagnosis present

## 2017-03-20 DIAGNOSIS — I058 Other rheumatic mitral valve diseases: Secondary | ICD-10-CM | POA: Diagnosis present

## 2017-03-20 DIAGNOSIS — N183 Chronic kidney disease, stage 3 (moderate): Secondary | ICD-10-CM | POA: Diagnosis present

## 2017-03-20 DIAGNOSIS — R001 Bradycardia, unspecified: Secondary | ICD-10-CM

## 2017-03-20 DIAGNOSIS — Z87891 Personal history of nicotine dependence: Secondary | ICD-10-CM

## 2017-03-20 DIAGNOSIS — B958 Unspecified staphylococcus as the cause of diseases classified elsewhere: Secondary | ICD-10-CM | POA: Diagnosis present

## 2017-03-20 DIAGNOSIS — Z8551 Personal history of malignant neoplasm of bladder: Secondary | ICD-10-CM

## 2017-03-20 DIAGNOSIS — Z86010 Personal history of colonic polyps: Secondary | ICD-10-CM

## 2017-03-20 DIAGNOSIS — Z515 Encounter for palliative care: Secondary | ICD-10-CM | POA: Diagnosis present

## 2017-03-20 DIAGNOSIS — G252 Other specified forms of tremor: Secondary | ICD-10-CM | POA: Diagnosis present

## 2017-03-20 DIAGNOSIS — G2581 Restless legs syndrome: Secondary | ICD-10-CM | POA: Diagnosis present

## 2017-03-20 DIAGNOSIS — R259 Unspecified abnormal involuntary movements: Secondary | ICD-10-CM | POA: Diagnosis not present

## 2017-03-20 DIAGNOSIS — I130 Hypertensive heart and chronic kidney disease with heart failure and stage 1 through stage 4 chronic kidney disease, or unspecified chronic kidney disease: Secondary | ICD-10-CM | POA: Diagnosis present

## 2017-03-20 DIAGNOSIS — I482 Chronic atrial fibrillation: Secondary | ICD-10-CM | POA: Diagnosis present

## 2017-03-20 DIAGNOSIS — F0391 Unspecified dementia with behavioral disturbance: Secondary | ICD-10-CM | POA: Diagnosis present

## 2017-03-20 DIAGNOSIS — Z7901 Long term (current) use of anticoagulants: Secondary | ICD-10-CM

## 2017-03-20 DIAGNOSIS — N3000 Acute cystitis without hematuria: Secondary | ICD-10-CM | POA: Diagnosis present

## 2017-03-20 DIAGNOSIS — I4891 Unspecified atrial fibrillation: Secondary | ICD-10-CM | POA: Diagnosis not present

## 2017-03-20 DIAGNOSIS — N179 Acute kidney failure, unspecified: Secondary | ICD-10-CM | POA: Diagnosis present

## 2017-03-20 DIAGNOSIS — G9341 Metabolic encephalopathy: Secondary | ICD-10-CM | POA: Diagnosis present

## 2017-03-20 DIAGNOSIS — N390 Urinary tract infection, site not specified: Secondary | ICD-10-CM | POA: Diagnosis not present

## 2017-03-20 DIAGNOSIS — J90 Pleural effusion, not elsewhere classified: Secondary | ICD-10-CM | POA: Diagnosis present

## 2017-03-20 DIAGNOSIS — F329 Major depressive disorder, single episode, unspecified: Secondary | ICD-10-CM | POA: Diagnosis present

## 2017-03-20 DIAGNOSIS — I7091 Generalized atherosclerosis: Secondary | ICD-10-CM | POA: Diagnosis not present

## 2017-03-20 DIAGNOSIS — M7989 Other specified soft tissue disorders: Secondary | ICD-10-CM

## 2017-03-20 DIAGNOSIS — E7800 Pure hypercholesterolemia, unspecified: Secondary | ICD-10-CM | POA: Diagnosis present

## 2017-03-20 DIAGNOSIS — R748 Abnormal levels of other serum enzymes: Secondary | ICD-10-CM | POA: Diagnosis present

## 2017-03-20 DIAGNOSIS — F419 Anxiety disorder, unspecified: Secondary | ICD-10-CM | POA: Diagnosis present

## 2017-03-20 DIAGNOSIS — R778 Other specified abnormalities of plasma proteins: Secondary | ICD-10-CM | POA: Diagnosis present

## 2017-03-20 DIAGNOSIS — Z8673 Personal history of transient ischemic attack (TIA), and cerebral infarction without residual deficits: Secondary | ICD-10-CM

## 2017-03-20 DIAGNOSIS — G959 Disease of spinal cord, unspecified: Secondary | ICD-10-CM | POA: Diagnosis present

## 2017-03-20 DIAGNOSIS — I454 Nonspecific intraventricular block: Secondary | ICD-10-CM | POA: Diagnosis present

## 2017-03-20 DIAGNOSIS — Z833 Family history of diabetes mellitus: Secondary | ICD-10-CM

## 2017-03-20 DIAGNOSIS — Z66 Do not resuscitate: Secondary | ICD-10-CM | POA: Diagnosis present

## 2017-03-20 DIAGNOSIS — E785 Hyperlipidemia, unspecified: Secondary | ICD-10-CM | POA: Diagnosis present

## 2017-03-20 DIAGNOSIS — Z885 Allergy status to narcotic agent status: Secondary | ICD-10-CM

## 2017-03-20 DIAGNOSIS — R7989 Other specified abnormal findings of blood chemistry: Secondary | ICD-10-CM | POA: Diagnosis present

## 2017-03-20 DIAGNOSIS — E1142 Type 2 diabetes mellitus with diabetic polyneuropathy: Secondary | ICD-10-CM | POA: Diagnosis present

## 2017-03-20 DIAGNOSIS — Z8546 Personal history of malignant neoplasm of prostate: Secondary | ICD-10-CM

## 2017-03-20 DIAGNOSIS — N184 Chronic kidney disease, stage 4 (severe): Secondary | ICD-10-CM | POA: Diagnosis present

## 2017-03-20 DIAGNOSIS — M109 Gout, unspecified: Secondary | ICD-10-CM | POA: Diagnosis present

## 2017-03-20 DIAGNOSIS — E1151 Type 2 diabetes mellitus with diabetic peripheral angiopathy without gangrene: Secondary | ICD-10-CM | POA: Diagnosis present

## 2017-03-20 DIAGNOSIS — R251 Tremor, unspecified: Secondary | ICD-10-CM | POA: Diagnosis present

## 2017-03-20 DIAGNOSIS — A419 Sepsis, unspecified organism: Principal | ICD-10-CM | POA: Diagnosis present

## 2017-03-20 DIAGNOSIS — E1122 Type 2 diabetes mellitus with diabetic chronic kidney disease: Secondary | ICD-10-CM | POA: Diagnosis present

## 2017-03-20 DIAGNOSIS — R252 Cramp and spasm: Secondary | ICD-10-CM

## 2017-03-20 DIAGNOSIS — I5031 Acute diastolic (congestive) heart failure: Secondary | ICD-10-CM | POA: Diagnosis present

## 2017-03-20 DIAGNOSIS — Z8249 Family history of ischemic heart disease and other diseases of the circulatory system: Secondary | ICD-10-CM

## 2017-03-20 DIAGNOSIS — Z823 Family history of stroke: Secondary | ICD-10-CM

## 2017-03-20 DIAGNOSIS — Z79899 Other long term (current) drug therapy: Secondary | ICD-10-CM

## 2017-03-20 LAB — URINALYSIS, COMPLETE (UACMP) WITH MICROSCOPIC
BILIRUBIN URINE: NEGATIVE
Glucose, UA: NEGATIVE mg/dL
KETONES UR: NEGATIVE mg/dL
NITRITE: NEGATIVE
Protein, ur: 30 mg/dL — AB
SPECIFIC GRAVITY, URINE: 1.01 (ref 1.005–1.030)
pH: 8 (ref 5.0–8.0)

## 2017-03-20 LAB — CBC WITH DIFFERENTIAL/PLATELET
BASOS ABS: 0 10*3/uL (ref 0–0.1)
Basophils Relative: 1 %
Eosinophils Absolute: 0.2 10*3/uL (ref 0–0.7)
Eosinophils Relative: 4 %
HEMATOCRIT: 34.9 % — AB (ref 40.0–52.0)
Hemoglobin: 11.2 g/dL — ABNORMAL LOW (ref 13.0–18.0)
Lymphocytes Relative: 10 %
Lymphs Abs: 0.6 10*3/uL — ABNORMAL LOW (ref 1.0–3.6)
MCH: 27.3 pg (ref 26.0–34.0)
MCHC: 32.2 g/dL (ref 32.0–36.0)
MCV: 84.7 fL (ref 80.0–100.0)
MONO ABS: 0.8 10*3/uL (ref 0.2–1.0)
MONOS PCT: 13 %
NEUTROS ABS: 4.5 10*3/uL (ref 1.4–6.5)
Neutrophils Relative %: 72 %
PLATELETS: 302 10*3/uL (ref 150–440)
RBC: 4.13 MIL/uL — ABNORMAL LOW (ref 4.40–5.90)
RDW: 16.1 % — AB (ref 11.5–14.5)
WBC: 6.2 10*3/uL (ref 3.8–10.6)

## 2017-03-20 LAB — BASIC METABOLIC PANEL
ANION GAP: 7 (ref 5–15)
BUN: 38 mg/dL — ABNORMAL HIGH (ref 6–20)
CALCIUM: 8.2 mg/dL — AB (ref 8.9–10.3)
CO2: 27 mmol/L (ref 22–32)
CREATININE: 2.43 mg/dL — AB (ref 0.61–1.24)
Chloride: 104 mmol/L (ref 101–111)
GFR, EST AFRICAN AMERICAN: 26 mL/min — AB (ref >60)
GFR, EST NON AFRICAN AMERICAN: 23 mL/min — AB (ref >60)
Glucose, Bld: 132 mg/dL — ABNORMAL HIGH (ref 65–99)
Potassium: 4.6 mmol/L (ref 3.5–5.1)
SODIUM: 138 mmol/L (ref 135–145)

## 2017-03-20 LAB — TROPONIN I
TROPONIN I: 0.04 ng/mL — AB (ref <0.03)
TROPONIN I: 0.06 ng/mL — AB (ref <0.03)

## 2017-03-20 LAB — URINALYSIS, MICROSCOPIC (REFLEX)
Bacteria, UA: NONE SEEN
SQUAMOUS EPITHELIAL / LPF: NONE SEEN

## 2017-03-20 LAB — MAGNESIUM: MAGNESIUM: 2.5 mg/dL — AB (ref 1.7–2.4)

## 2017-03-20 MED ORDER — POTASSIUM CHLORIDE CRYS ER 10 MEQ PO TBCR
10.0000 meq | EXTENDED_RELEASE_TABLET | Freq: Every day | ORAL | Status: DC
  Administered 2017-03-20: 10 meq via ORAL
  Filled 2017-03-20: qty 1

## 2017-03-20 MED ORDER — ACETAMINOPHEN 325 MG PO TABS: 650.0000 mg | ORAL_TABLET | Freq: Four times a day (QID) | ORAL | Status: DC | PRN

## 2017-03-20 MED ORDER — FUROSEMIDE 10 MG/ML IJ SOLN
40.0000 mg | Freq: Once | INTRAMUSCULAR | Status: AC
  Administered 2017-03-20: 40 mg via INTRAVENOUS
  Filled 2017-03-20: qty 4

## 2017-03-20 MED ORDER — CARRASYN HYDROGEL WOUND DRESS EX GEL: CUTANEOUS | Status: DC | PRN

## 2017-03-20 MED ORDER — AMLODIPINE BESYLATE 5 MG PO TABS
5.0000 mg | ORAL_TABLET | Freq: Every day | ORAL | Status: DC
  Administered 2017-03-20: 5 mg via ORAL
  Filled 2017-03-20: qty 1

## 2017-03-20 MED ORDER — DULOXETINE HCL 30 MG PO CPEP
30.0000 mg | ORAL_CAPSULE | Freq: Every day | ORAL | Status: DC
Start: 2017-03-20 — End: 2017-03-24
  Administered 2017-03-20: 30 mg via ORAL
  Filled 2017-03-20: qty 1

## 2017-03-20 MED ORDER — ALLOPURINOL 100 MG PO TABS
100.0000 mg | ORAL_TABLET | Freq: Every day | ORAL | Status: DC
  Administered 2017-03-20: 100 mg via ORAL
  Filled 2017-03-20: qty 1

## 2017-03-20 MED ORDER — MIRTAZAPINE 15 MG PO TABS
30.0000 mg | ORAL_TABLET | Freq: Every day | ORAL | Status: DC
  Administered 2017-03-20: 30 mg via ORAL
  Filled 2017-03-20: qty 2

## 2017-03-20 MED ORDER — LORATADINE 10 MG PO TABS
10.0000 mg | ORAL_TABLET | Freq: Every day | ORAL | Status: DC
  Administered 2017-03-20: 10 mg via ORAL
  Filled 2017-03-20: qty 1

## 2017-03-20 MED ORDER — DIAZEPAM 5 MG PO TABS: 2.5000 mg | ORAL_TABLET | Freq: Once | ORAL | Status: DC

## 2017-03-20 MED ORDER — SODIUM CHLORIDE 0.9% FLUSH
3.0000 mL | Freq: Two times a day (BID) | INTRAVENOUS | Status: DC
  Administered 2017-03-20 – 2017-03-25 (×10): 3 mL via INTRAVENOUS

## 2017-03-20 MED ORDER — CEFTRIAXONE SODIUM-DEXTROSE 1-3.74 GM-% IV SOLR
1.0000 g | Freq: Once | INTRAVENOUS | Status: AC
  Administered 2017-03-20: 1 g via INTRAVENOUS

## 2017-03-20 MED ORDER — TRAZODONE HCL 50 MG PO TABS
50.0000 mg | ORAL_TABLET | Freq: Every day | ORAL | Status: DC
  Administered 2017-03-20: 50 mg via ORAL
  Filled 2017-03-20: qty 1

## 2017-03-20 MED ORDER — ONDANSETRON 4 MG PO TBDP
4.0000 mg | ORAL_TABLET | Freq: Three times a day (TID) | ORAL | Status: DC | PRN
  Filled 2017-03-20: qty 1

## 2017-03-20 MED ORDER — DIAZEPAM 2 MG PO TABS
2.0000 mg | ORAL_TABLET | Freq: Once | ORAL | Status: AC
  Administered 2017-03-20: 2 mg via ORAL
  Filled 2017-03-20: qty 1

## 2017-03-20 MED ORDER — FINASTERIDE 5 MG PO TABS
5.0000 mg | ORAL_TABLET | Freq: Every day | ORAL | Status: DC
  Administered 2017-03-20: 5 mg via ORAL
  Filled 2017-03-20 (×2): qty 1

## 2017-03-20 MED ORDER — PANTOPRAZOLE SODIUM 40 MG PO TBEC
40.0000 mg | DELAYED_RELEASE_TABLET | Freq: Every day | ORAL | Status: DC
  Administered 2017-03-20: 40 mg via ORAL
  Filled 2017-03-20: qty 1

## 2017-03-20 MED ORDER — CYANOCOBALAMIN 1000 MCG/ML IJ SOLN: 1000.0000 ug | INTRAMUSCULAR | Status: DC

## 2017-03-20 MED ORDER — BACLOFEN 10 MG PO TABS
10.0000 mg | ORAL_TABLET | Freq: Three times a day (TID) | ORAL | Status: DC
  Administered 2017-03-20 (×4): 10 mg via ORAL
  Filled 2017-03-20 (×6): qty 1

## 2017-03-20 MED ORDER — CEFTRIAXONE SODIUM-DEXTROSE 1-3.74 GM-% IV SOLR
INTRAVENOUS | Status: AC
  Administered 2017-03-20: 1 g via INTRAVENOUS
  Filled 2017-03-20: qty 50

## 2017-03-20 MED ORDER — DEXTROSE 5 % IV SOLN: 1.0000 g | Freq: Once | INTRAVENOUS | Status: DC

## 2017-03-20 MED ORDER — CEFTRIAXONE SODIUM-DEXTROSE 1-3.74 GM-% IV SOLR: 1.0000 g | INTRAVENOUS | Status: DC

## 2017-03-20 MED ORDER — FUROSEMIDE 10 MG/ML IJ SOLN
40.0000 mg | Freq: Two times a day (BID) | INTRAMUSCULAR | Status: DC
  Administered 2017-03-20 – 2017-03-23 (×6): 40 mg via INTRAVENOUS
  Filled 2017-03-20 (×6): qty 4

## 2017-03-20 MED ORDER — DOCUSATE SODIUM 100 MG PO CAPS
100.0000 mg | ORAL_CAPSULE | Freq: Two times a day (BID) | ORAL | Status: DC
  Administered 2017-03-20: 100 mg via ORAL
  Filled 2017-03-20: qty 1

## 2017-03-20 MED ORDER — ASPIRIN EC 81 MG PO TBEC
81.0000 mg | DELAYED_RELEASE_TABLET | Freq: Every day | ORAL | Status: DC
  Administered 2017-03-20: 81 mg via ORAL
  Filled 2017-03-20: qty 1

## 2017-03-20 MED ORDER — TAMSULOSIN HCL 0.4 MG PO CAPS
0.4000 mg | ORAL_CAPSULE | Freq: Every day | ORAL | Status: DC
  Administered 2017-03-20: 0.4 mg via ORAL
  Filled 2017-03-20: qty 1

## 2017-03-20 MED ORDER — APIXABAN 2.5 MG PO TABS
2.5000 mg | ORAL_TABLET | Freq: Two times a day (BID) | ORAL | Status: DC
Start: 2017-03-20 — End: 2017-03-21
  Administered 2017-03-20 (×2): 2.5 mg via ORAL
  Filled 2017-03-20 (×2): qty 1

## 2017-03-20 MED ORDER — ACETAMINOPHEN 650 MG RE SUPP: 650.0000 mg | Freq: Four times a day (QID) | RECTAL | Status: DC | PRN

## 2017-03-20 MED ORDER — DEXTROSE 5 % IV SOLN
1.0000 g | INTRAVENOUS | Status: DC
  Administered 2017-03-21 – 2017-03-23 (×3): 1 g via INTRAVENOUS
  Filled 2017-03-20 (×4): qty 10

## 2017-03-20 MED ORDER — PRIMIDONE 50 MG PO TABS
50.0000 mg | ORAL_TABLET | Freq: Two times a day (BID) | ORAL | Status: DC
  Administered 2017-03-20 (×2): 50 mg via ORAL
  Filled 2017-03-20 (×3): qty 1

## 2017-03-20 MED ORDER — DIAZEPAM 5 MG/ML IJ SOLN: 2.5000 mg | Freq: Once | INTRAMUSCULAR | Status: DC

## 2017-03-20 MED ORDER — DIAZEPAM 2 MG PO TABS
2.0000 mg | ORAL_TABLET | Freq: Once | ORAL | Status: DC
  Filled 2017-03-20: qty 1

## 2017-03-20 NOTE — ED Triage Notes (Signed)
Pt arrived via ems. Pt reports waking up at 0300 with "uncontrollable shaking" of his hands. Pt also reports that is head shakes and he cannot make it stop. Pt was able to stop shaking hands upon direction and head is not shaking upon assessment. Pt alert and oriented and in no apparent distress.

## 2017-03-20 NOTE — Progress Notes (Signed)
*  PRELIMINARY RESULTS* Echocardiogram 2D Echocardiogram has been performed.  Dean Garcia 03/20/2017, 1:56 PM

## 2017-03-20 NOTE — Progress Notes (Signed)
Pt admitted to 234. He is a/o but initially very tremulouis in hands and c/o of discomfort and restless leg. Dr. Clayton Bibles. Notified. Baclofen and valium (po) given with good effect. Pt able to tolerate echo. Taken to [facility]

## 2017-03-20 NOTE — ED Notes (Signed)
Patient screened for MRI.  Able to answer all questions with no difficulty noted.

## 2017-03-20 NOTE — Progress Notes (Signed)
Notified Dr. Jannifer Franklin heart rate going into the 30's and staying at 39 and 40. No symptoms. EKG ordered.

## 2017-03-20 NOTE — H&P (Addendum)
[facility] [place] NAME: Dean Garcia    MR#:  [id number]  DATE OF BIRTH:  1928-11-07  DATE OF ADMISSION:  03/20/2017  PRIMARY CARE PHYSICIAN: Kirk Ruths., MD   REQUESTING/REFERRING PHYSICIAN:   CHIEF COMPLAINT:   Chief Complaint  Patient presents with  . Shaking    HISTORY OF PRESENT ILLNESS: Dean Garcia  is a 83 y.o. male with a known history of Multiple medical problems including bladder cancer, atrial fibrillation, diabetes, gout, hypertension, hyperlipidemia, mitral valve prolapse, also arthritis, peripheral neuropathy, peripheral vascular disease with newly diagnosed near complete occlusion of serial tibialis and peroneal arteries bilaterally, lower extremity cramping, who presents to the hospital with complaints of shakiness episode. On arrival to emergency room, he was given Valium within any shakiness improved and. His labs revealed urinary tract infection and hospitalist services were contacted for admission. He is troponin was also checked and was found to be elevated. Patient denies any chest pains, however, was noted to be bradycardic with heart rate in 30s to 40s, apparently used on propranolol at home.  PAST MEDICAL HISTORY:   Past Medical History:  Diagnosis Date  . Atrial fibrillation ([place])   . Bladder cancer ([place])   . Bundle branch block, right   . Carotid stenosis   . Colon polyp   . CVA (cerebral infarction)   . Diabetes ([place])   . Gout   . High cholesterol   . Hypertension   . Mitral valve prolapse   . Prostate cancer ([place])   . Tremor     PAST SURGICAL HISTORY: Past Surgical History:  Procedure Laterality Date  . APPENDECTOMY    . CAROTID ARTERY ANGIOPLASTY    . CHOLECYSTECTOMY      SOCIAL HISTORY:  Social History  Substance Use Topics  . Smoking status: Former Smoker    Types: Pipe    Quit date: 10/29/2010  . Smokeless tobacco: Never Used  . Alcohol use Yes     Comment: 1 beer a  day    FAMILY HISTORY:  Family History  Problem Relation Age of Onset  . Hypertension Father   . Stroke Father   . Diabetes Mother   . Prostate cancer Neg Hx   . Kidney cancer Neg Hx   . Kidney disease Neg Hx   . Bladder Cancer Neg Hx     DRUG ALLERGIES:  Allergies  Allergen Reactions  . Demerol [Meperidine] Nausea And Vomiting and Nausea Only    Other reaction(s): Nausea And Vomiting, Vomiting Nausea/vomiting    Review of Systems  Constitutional: Positive for chills and malaise/fatigue. Negative for fever and weight loss.  HENT: Negative for congestion.   Eyes: Negative for blurred vision and double vision.  Respiratory: Positive for shortness of breath. Negative for cough, sputum production and wheezing.   Cardiovascular: Negative for chest pain, palpitations, orthopnea, leg swelling and PND.  Gastrointestinal: Positive for constipation. Negative for abdominal pain, blood in stool, diarrhea, nausea and vomiting.  Genitourinary: Negative for dysuria, frequency, hematuria and urgency.  Musculoskeletal: Negative for falls.  Neurological: Negative for dizziness, tremors, focal weakness and headaches.  Endo/Heme/Allergies: Does not bruise/bleed easily.  Psychiatric/Behavioral: Negative for depression. The patient does not have insomnia.     MEDICATIONS AT HOME:  Prior to Admission medications   Medication Sig Start Date End Date Taking? Authorizing Provider  allopurinol (ZYLOPRIM) 100 MG tablet Take 100 mg by mouth daily.   Yes Historical Provider, MD  colchicine  0.6 MG tablet Take 0.6 mg by mouth 2 (two) times daily as needed (gout).    Yes Historical Provider, MD  cyanocobalamin (,VITAMIN B-12,) 1000 MCG/ML injection Inject 1,000 mcg into the muscle every 30 (thirty) days. 14th of month 10/31/15  Yes Historical Provider, MD  docusate sodium (COLACE) 100 MG capsule Take 1 capsule (100 mg total) by mouth 2 (two) times daily. 01/24/17  Yes Vaughan Basta, MD  ELIQUIS 2.5  MG TABS tablet Take 2.5 mg by mouth 2 (two) times daily. 03/20/17  Yes Historical Provider, MD  fexofenadine (ALLEGRA) 180 MG tablet Take 180 mg by mouth daily as needed for allergies or rhinitis.   Yes Historical Provider, MD  mirtazapine (REMERON) 30 MG tablet Take 1 tablet (30 mg total) by mouth at bedtime. 01/24/17  Yes Vaughan Basta, MD  omeprazole (PRILOSEC) 20 MG capsule Take 20 mg by mouth daily.   Yes Historical Provider, MD  ondansetron (ZOFRAN-ODT) 4 MG disintegrating tablet Take 4 mg by mouth every 8 (eight) hours as needed for nausea or vomiting.    Yes Historical Provider, MD  oxyCODONE-acetaminophen (PERCOCET) 7.5-325 MG tablet Take 1 tablet by mouth every 4 (four) hours as needed for severe pain. 03/12/17 03/12/18 Yes Earleen Newport, MD  potassium chloride (K-DUR,KLOR-CON) 10 MEQ tablet Take 10 mEq by mouth daily.   Yes Historical Provider, MD  primidone (MYSOLINE) 50 MG tablet Take 50 mg by mouth 2 (two) times daily.    Yes Historical Provider, MD  ranitidine (ZANTAC) 150 MG tablet Take 300 mg by mouth every evening.    Yes Historical Provider, MD  sitaGLIPtin (JANUVIA) 25 MG tablet Take 25 mg by mouth daily.    Yes Historical Provider, MD  tamsulosin (FLOMAX) 0.4 MG CAPS capsule Take 1 capsule (0.4 mg total) by mouth daily. 01/07/17  Yes Shannon A McGowan, PA-C  torsemide (DEMADEX) 20 MG tablet Take 20 mg by mouth 2 (two) times daily.   Yes Historical Provider, MD  traZODone (DESYREL) 50 MG tablet Take 50 mg by mouth at bedtime.   Yes Historical Provider, MD  amLODipine (NORVASC) 5 MG tablet Take 1 tablet (5 mg total) by mouth daily. Patient not taking: Reported on 03/20/2017 01/25/17   Vaughan Basta, MD  DULoxetine (CYMBALTA) 30 MG capsule Take 1 capsule (30 mg total) by mouth daily. Patient not taking: Reported on 03/20/2017 01/25/17   Vaughan Basta, MD  glucose blood (ACCU-CHEK COMPACT PLUS) test strip  12/07/15   Historical Provider, MD    HYDROcodone-acetaminophen (NORCO) 10-325 MG tablet Take 1 tablet by mouth every 6 (six) hours as needed for moderate pain. Patient not taking: Reported on 03/20/2017 01/24/17   Vaughan Basta, MD  Insulin Pen Needle (B-D ULTRAFINE III SHORT PEN) 31G X 8 MM MISC USE AS DIRECTED 12/24/16   Historical Provider, MD  Wound Dressings (ALLANTOIN) gel Apply topically as needed for wound care.    Historical Provider, MD      PHYSICAL EXAMINATION:   VITAL SIGNS: Blood pressure 127/63, pulse (!) 42, temperature 97.3 F (36.3 C), temperature source Oral, resp. rate 14, height 6\' 1"  (1.854 m), weight 77.1 kg (170 lb), SpO2 100 %.  GENERAL:  83 y.o.-year-old patient lying in the bed In mild-to-moderate distress due to lower extremity cramping.  EYES: Pupils equal, round, reactive to light and accommodation. No scleral icterus. Extraocular muscles intact.  HEENT: Head atraumatic, normocephalic. Oropharynx and nasopharynx clear.  NECK:  Supple, no jugular venous distention. No thyroid enlargement, no tenderness.  LUNGS: Diminished breath sounds bilaterally at bases, no wheezing, rales,rhonchi or crepitation. No use of accessory muscles of respiration.  CARDIOVASCULAR: S1, S2 , bradycardic, regular. , 3/6 systolic murmur was noted, and precordium ,  no rubs, or gallops.  ABDOMEN: Soft, nontender, nondistended. Bowel sounds present. No organomegaly or mass.  EXTREMITIES: 3+ lower extremity and pedal edema, no cyanosis, or clubbing. Peripheral pulses are diminished NEUROLOGIC: Cranial nerves II through XII are grossly intact. Muscle strength 5/5 in all extremities. Sensation intact. Gait not checked.  PSYCHIATRIC: The patient is alert and oriented x 3.  SKIN: Erythema in left lower extremity anterior shin, no other rashes, lesion, or ulcers .   LABORATORY PANEL:   CBC  Recent Labs Lab 03/20/17 0445  WBC 6.2  HGB 11.2*  HCT 34.9*  PLT 302  MCV 84.7  MCH 27.3  MCHC 32.2  RDW 16.1*  LYMPHSABS  0.6*  MONOABS 0.8  EOSABS 0.2  BASOSABS 0.0   ------------------------------------------------------------------------------------------------------------------  Chemistries   Recent Labs Lab 03/20/17 0445  NA 138  K 4.6  CL 104  CO2 27  GLUCOSE 132*  BUN 38*  CREATININE 2.43*  CALCIUM 8.2*   ------------------------------------------------------------------------------------------------------------------  Cardiac Enzymes  Recent Labs Lab 03/20/17 0445 03/20/17 0748  TROPONINI 0.04* 0.06*   ------------------------------------------------------------------------------------------------------------------  RADIOLOGY: No results found.  EKG: Orders placed or performed during the hospital encounter of 03/20/17  . ED EKG  . ED EKG  . EKG 12-Lead  . EKG 12-Lead  . EKG 12-Lead  . EKG 12-Lead   EKG in emergency room revealed a sinus tach, bradycardia, rate of 47, however, repeat EKG revealed junctional rhythm with right bundle branch block, nonspecific ST-T changes   IMPRESSION AND PLAN:  Active Problems:   Elevated troponin   Sepsis (HCC)   Bradycardia   Acute lower UTI   Cramping of feet   CKD (chronic kidney disease), stage IV (HCC)   Swelling of lower extremity  #1. Elevated troponin, could be due to significant bradycardia, etc. demand ischemia, admitted to medical floor, follow cardiac enzymes, sensory, get cardiologist involved. Recommendations, get echo #2. Acute diastolic CHF, continue patient on torsemide, following in's and outs, weight, get echo  #3. Bradycardia and junctional rhythm, likely due to beta blockers, get TSH and hold beta blockers, follow on telemetry #4. Acute lower urinary tract infection, continue Rocephin, get urine cultures, adjust antibiotics depending on culture results #5 cramping in the feet, discussed with vascular surgeon, Dr. Delana Meyer, who does not feel that patient's presentation is due to peripheral vascular disease,  although it exists, he feels is very likely is myelopathy, get MRI of the cervical and lumbar spines to confirm ,  initiate him on baclofen, may need neurosurgery input #6. CK D stage IV, followed with diuresis, hold colchicine #7. Lower extremity swelling. Again due to acute diastolic CHF, continue diuresis with torsemide, following in's and outs #8. Acute Urinary retention, likely due to urinary tract infection, now for a catheter is placed, continue Flomax, add finasteride  All the records are reviewed and case discussed with ED provider. Management plans discussed with the patient, family and they are in agreement.  CODE STATUS:    Code Status Orders        Start     Ordered   03/20/17 0908  Do not attempt resuscitation (DNR)  Continuous    Question Answer Comment  In the event of cardiac or respiratory ARREST Do not call a "code blue"   In the event  of cardiac or respiratory ARREST Do not perform Intubation, CPR, defibrillation or ACLS   In the event of cardiac or respiratory ARREST Use medication by any route, position, wound care, and other measures to relive pain and suffering. May use oxygen, suction and manual treatment of airway obstruction as needed for comfort.      03/20/17 5400    Code Status History    Date Active Date Inactive Code Status Order ID Comments User Context   02/22/2017  3:00 PM 02/24/2017  2:24 PM DNR [id number]  Loletha Grayer, MD ED   01/24/2017 11:23 AM 01/24/2017  5:25 PM DNR [id number]  Vaughan Basta, MD Inpatient   01/20/2017 12:10 AM 01/24/2017 11:22 AM Full Code [id number]  Idelle Crouch, MD ED   01/19/2017  9:54 PM 01/20/2017 12:10 AM DNR [id number]  Harvie Bridge, DO ED   11/29/2016  5:16 AM 12/04/2016  8:01 PM DNR [id number]  Harrie Foreman, MD Inpatient   11/19/2016  7:00 PM 11/23/2016  8:20 PM DNR [id number]  Gladstone Lighter, MD Inpatient   02/12/2016  8:30 PM 02/14/2016  6:23 PM DNR [id number]  Hillary Bow, MD ED    Advance Directive  Documentation     Most Recent Value  Type of Advance Directive  Healthcare Power of [place], Out of facility DNR (pink MOST or yellow form)  Pre-existing out of facility DNR order (yellow form or pink MOST form)  -  "MOST" Form in Place?  -       TOTAL TIME TAKING CARE OF THIS PATIENT: 55  minutes.    Theodoro Grist M.D on 03/20/2017 at 11:32 AM  Between 7am to 6pm - Pager - [phone] After 6pm go to [email or web address] - password [email or web address] [facility]  Office  [phone]  CC: Primary care physician; Kirk Ruths., MD

## 2017-03-20 NOTE — ED Provider Notes (Signed)
[facility] Emergency Department Provider Note   ____________________________________________   First MD Initiated Contact with Patient 03/20/17 [phone]     (approximate)  I have reviewed the triage vital signs and the nursing notes.   HISTORY  Chief Complaint Shaking    HPI Dean Garcia is a 83 y.o. male brought to the ED from [place] via EMS with a chief complaint of "shaking". Patient reports waking up at 3 AM with shaking of his hands and head. History of atrial fibrillation on Eliquis with recent visits to vascular surgeon for peripheral artery disease, telephone communications to PCP to try and obtain MRI of his lumbar spine. Wife denies recent fever, chills, chest pain, shortness of breath, abdominal pain, nausea, vomiting. Denies recent travel or trauma. Nothing makes his symptoms better or worse.   Past Medical History:  Diagnosis Date  . Atrial fibrillation ([place])   . Bladder cancer ([place])   . Bundle branch block, right   . Carotid stenosis   . Colon polyp   . CVA (cerebral infarction)   . Diabetes ([place])   . Gout   . High cholesterol   . Hypertension   . Mitral valve prolapse   . Prostate cancer ([place])   . Tremor     Patient Active Problem List   Diagnosis Date Noted  . ASO (arteriosclerosis obliterans) 03/14/2017  . Iliac artery aneurysm ([place]) 03/14/2017  . Lymphedema 03/08/2017  . Epididymitis 02/22/2017  . Pain in limb 02/07/2017  . Hypoglycemia 01/19/2017  . Depression 01/19/2017  . Hypoglycemia due to insulin 01/19/2017  . Muscle weakness (generalized)   . SOB (shortness of breath)   . Palliative care by specialist   . HCAP (healthcare-associated pneumonia) 11/29/2016  . Pressure injury of skin 11/20/2016  . Palliative care encounter   . Goals of care, counseling/discussion   . Acute renal failure superimposed on chronic kidney disease ([place])   . ARF (acute renal failure) ([place]) 11/19/2016  . Moderate major depression,  single episode ([place]) 10/22/2016  . Mild dementia 10/22/2016  . Paroxysmal atrial fibrillation ([place]) 02/21/2016  . A-fib ([place]) 02/12/2016  . Centrilobular emphysema ([place]) 12/27/2015  . B12 deficiency 04/12/2015  . Atherosclerosis of aorta ([place]) 10/23/2014  . 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 Has a tremor 07/17/2014  . Type 2 diabetes mellitus ([place]) 07/17/2014  . Edema 07/17/2014  . Billowing mitral valve 07/12/2014  . BP (high blood pressure) 07/12/2014  . Carotid artery narrowing 07/12/2014  . Impaired renal function 06/07/2014  . Proctitis, radiation 06/07/2014  . Malignant neoplasm of prostate ([place]) 06/07/2014  . Peripheral nerve disease 06/07/2014  . DJD (degenerative joint disease) 06/07/2014  . Disorder of peripheral nervous system 06/07/2014  . Gout 06/07/2014  . 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 DD (diverticular disease) 06/07/2014  . Disease of rectum 06/07/2014  . Diabetes mellitus ([place]) 06/07/2014  . Malignant neoplasm of urinary bladder ([place]) 06/07/2014    Past Surgical History:  Procedure Laterality Date  . APPENDECTOMY    . CAROTID ARTERY ANGIOPLASTY    . CHOLECYSTECTOMY      Prior to Admission medications   Medication Sig Start Date End Date Taking? Authorizing Provider  allopurinol (ZYLOPRIM) 100 MG tablet Take 100 mg by mouth daily.    Historical Provider, MD  amLODipine (NORVASC) 5 MG tablet Take 1 tablet (5 mg total) by mouth daily. 01/25/17   Vaughan Basta, MD  Carbomer Gel Base (HYDROGEL) GEL Use.    Historical Provider, MD  colchicine 0.6 MG tablet Take 0.6 mg by mouth 2 (two) times daily as needed (gout).     Historical Provider, MD  cyanocobalamin (,VITAMIN B-12,) 1000 MCG/ML injection Inject 1,000 mcg into the muscle every 30 (thirty) days. 8th of month 10/31/15   Historical Provider, MD  docusate sodium (COLACE) 100 MG capsule Take 1 capsule (100 mg total) by mouth 2 (two) times daily. 01/24/17   Vaughan Basta, MD  DULoxetine (CYMBALTA) 30 MG capsule Take 1 capsule (30 mg total) by mouth daily. 01/25/17   Vaughan Basta, MD  fexofenadine (ALLEGRA) 180 MG tablet Take 180 mg by mouth daily as needed for allergies or rhinitis.    Historical Provider, MD  glucose blood (ACCU-CHEK COMPACT PLUS) test strip  12/07/15   Historical Provider, MD  HYDROcodone-acetaminophen (NORCO) 10-325 MG tablet Take 1 tablet by mouth every 6 (six) hours as needed for moderate pain. 01/24/17   Vaughan Basta, MD  Insulin Pen Needle (B-D ULTRAFINE III SHORT PEN) 31G X 8 MM MISC USE AS DIRECTED 12/24/16   Historical Provider, MD  magnesium oxide (MAG-OX) 400 MG tablet Take 400 mg by mouth every evening.    Historical Provider, MD  mirtazapine (REMERON) 30 MG tablet Take 1 tablet (30 mg total) by mouth at bedtime. 01/24/17   Vaughan Basta, MD  omeprazole (PRILOSEC) 20 MG capsule Take 20 mg by mouth daily.    Historical Provider, MD  ondansetron (ZOFRAN-ODT) 4 MG disintegrating tablet Take 4 mg by mouth every 8 (eight) hours as needed for nausea or vomiting.     Historical Provider, MD  oxyCODONE-acetaminophen (PERCOCET) 7.5-325 MG tablet Take 1 tablet by mouth every 4 (four) hours as needed for severe pain. 03/12/17 03/12/18  Earleen Newport, MD  potassium chloride (K-DUR) 10 MEQ tablet Take 10 mEq by mouth daily.    Historical Provider, MD  primidone (MYSOLINE) 50 MG tablet Take 50 mg by mouth 2 (two) times daily.     Historical Provider, MD  ranitidine (ZANTAC) 150 MG tablet Take 300 mg by mouth every evening.     Historical Provider, MD  sitaGLIPtin (JANUVIA) 25 MG tablet Take 25 mg by mouth daily.     Historical Provider, MD  tamsulosin (FLOMAX) 0.4 MG CAPS capsule Take 1 capsule (0.4 mg total) by mouth daily. 01/07/17   Nori Riis, PA-C  torsemide (DEMADEX) 20 MG tablet Take 20 mg by mouth 2 (two) times daily.    Historical Provider, MD  traZODone (DESYREL) 50 MG tablet Take 50 mg by mouth at  bedtime.    Historical Provider, MD  Wound Dressings (ALLANTOIN) gel Apply topically as needed for wound care.    Historical Provider, MD    Allergies Demerol [meperidine]  Family History  Problem Relation Age of Onset  . Hypertension Father   . Stroke Father   . Diabetes Mother   . Prostate cancer Neg Hx   . Kidney cancer Neg Hx   . Kidney disease Neg Hx   . Bladder Cancer Neg Hx     Social History Social History  Substance Use Topics  . Smoking status: Former Smoker    Types: Pipe    Quit date: 10/29/2010  . Smokeless tobacco: Never Used  . Alcohol use Yes     Comment: 1 beer a day    Review of Systems  Constitutional: Positive for "shaking". No fever/chills. Eyes: No visual changes. ENT: No sore throat. Cardiovascular: Denies chest pain. Respiratory: Denies shortness of breath. Gastrointestinal: No  abdominal pain.  No nausea, no vomiting.  No diarrhea.  No constipation. Genitourinary: Negative for dysuria. Musculoskeletal: Negative for back pain. Skin: Negative for rash. Neurological: Negative for headaches, focal weakness or numbness.  10-point ROS otherwise negative.  ____________________________________________   PHYSICAL EXAM:  VITAL SIGNS: ED Triage Vitals  Enc Vitals Group     BP 03/20/17 0424 (!) 159/94     Pulse Rate 03/20/17 0424 (!) 51     Resp 03/20/17 0424 18     Temp 03/20/17 0424 97.3 F (36.3 C)     Temp Source 03/20/17 0424 Oral     SpO2 03/20/17 0424 99 %     Weight 03/20/17 0425 170 lb (77.1 kg)     Height 03/20/17 0425 6\' 1"  (1.854 m)     Head Circumference --      Peak Flow --      Pain Score 03/20/17 0424 10     Pain Loc --      Pain Edu? --      Excl. in [place]? --     Constitutional: Alert and oriented. Well appearing and in no acute distress. Eyes: Conjunctivae are normal. PERRL. EOMI. Head: Atraumatic. Nose: No congestion/rhinnorhea. Mouth/Throat: Mucous membranes are moist.  Oropharynx non-erythematous. Neck: No  stridor.  Supple neck without meningismus. Cardiovascular: Normal rate, regular rhythm. Grossly normal heart sounds.  Good peripheral circulation. Respiratory: Normal respiratory effort.  No retractions. Lungs CTAB. Gastrointestinal: Soft and nontender. No distention. No abdominal bruits. No CVA tenderness. Musculoskeletal: No lower extremity tenderness. 1+ BLE edema.  No joint effusions. Neurologic:  Normal speech and language. Alert and oriented 3. No gross focal neurologic deficits are appreciated. Flapping both hands and rotating from pronation to supination as well as shaking his head. All rhythmic motion stops with distraction. Keeps his eyes closed during examination but occasionally will open his right eye to peek at me. Skin:  Skin is warm, dry and intact. No rash noted. No petechiae. Psychiatric: Mood and affect are normal. Speech and behavior are normal.  ____________________________________________   LABS (all labs ordered are listed, but only abnormal results are displayed)  Labs Reviewed  CBC WITH DIFFERENTIAL/PLATELET - Abnormal; Notable for the following:       Result Value   RBC 4.13 (*)    Hemoglobin 11.2 (*)    HCT 34.9 (*)    RDW 16.1 (*)    Lymphs Abs 0.6 (*)    All other components within normal limits  BASIC METABOLIC PANEL - Abnormal; Notable for the following:    Glucose, Bld 132 (*)    BUN 38 (*)    Creatinine, Ser 2.43 (*)    Calcium 8.2 (*)    GFR calc non Af Amer 23 (*)    GFR calc Af Amer 26 (*)    All other components within normal limits  TROPONIN I - Abnormal; Notable for the following:    Troponin I 0.04 (*)    All other components within normal limits  URINALYSIS, COMPLETE (UACMP) WITH MICROSCOPIC - Abnormal; Notable for the following:    Color, Urine YELLOW (*)    APPearance HAZY (*)    Hgb urine dipstick SMALL (*)    Protein, ur 30 (*)    Leukocytes, UA LARGE (*)    All other components within normal limits  URINE CULTURE  URINALYSIS,  MICROSCOPIC (REFLEX)  TROPONIN I   ____________________________________________  EKG  ED ECG REPORT I, Azrael Huss J, the attending physician, personally viewed and  interpreted this ECG.   Date: 03/20/2017  EKG Time: 0626  Rate: 47  Rhythm: sinus bradycardia  Axis: Normal  Intervals:right bundle branch block  ST&T Change: Nonspecific  ____________________________________________  RADIOLOGY  None ____________________________________________   PROCEDURES  Procedure(s) performed: None  Procedures  Critical Care performed: No  ____________________________________________   INITIAL IMPRESSION / ASSESSMENT AND PLAN / ED COURSE  Pertinent labs & imaging results that were available during my care of the patient were reviewed by me and considered in my medical decision making (see chart for details).  83 year old male who presents with "uncontrollable shaking". Shaking appears to stop on distraction. Patient himself is requesting anxiolytic. I think low-dose Valium would be beneficial. Laboratory results notable for baseline renal insufficiency, minimally elevated troponin. Will repeat troponin given that patient is not experiencing chest pain. I reviewed patient's MAR personally to determine that he is not on any medications which would cause dystonic reaction, which he is not. He is neurologically and cognitively intact; do not feel CT head imaging is warranted at this time.  Clinical Course as of Mar 20 702  Wed Mar 20, 2017  0631 Patient resting comfortably after Valium. No tremors. Urine results notable for UTI. Will add urine culture and administer IV antibiotic.  [JS]  0700 Care transferred to Dr. Burlene Arnt pending results of repeat troponin and disposition.  [JS]    Clinical Course User Index [JS] Paulette Blanch, MD     ____________________________________________   FINAL CLINICAL IMPRESSION(S) / ED DIAGNOSES  Final diagnoses:  Urinary tract infection without  hematuria, site unspecified  Coarse tremors      NEW MEDICATIONS STARTED DURING THIS VISIT:  New Prescriptions   No medications on file     Note:  This document was prepared using Dragon voice recognition software and may include unintentional dictation errors.    Paulette Blanch, MD 03/20/17 [id number]

## 2017-03-20 NOTE — ED Provider Notes (Addendum)
-----------------------------------------   7:28 AM on 03/20/2017 -----------------------------------------  And out to me by Dr. sung at this time. Patient with a clearance for discharge, here with some tremor completely resolved with Ativan. Does have a urinary tract infection for which she would like to give the patient Keflex on discharge. The only reason he is still here she is wearing a second troponin given his age. If that is negative for increase, he is to be discharged with close outpatient follow-up.  ----------------------------------------- 9:02 AM on 03/20/2017 -----------------------------------------  Patient in no acute distress vital signs are reassuring aside from progressive bradycardic which is sometimes in the 40s, I did a repeat EKG which shows a bradycardia rhythm at 43 with a right bundle-branch block configuration no acute ST elevation or depression however I don't see P waves. Certain the possible he is in a junctional rhythm. Sinus bradycardia is also possible, as he did appear to be in sinus bradycardia when he arrived but I don't see definitive P waves on the last EKG. Nonetheless is pressures are holding well and is asymptomatic with this at this time.. We will admit to the hospital for ongoing troponin increase however he has no chest pain or shortness of breath complaints at this time. We will also discuss with his cardiologist. Dr. Nehemiah Massed who agrees with management. Patient is on beta blocker on according to his last cardiology med list. Hospitalist will hold that.   Schuyler Amor, MD 03/20/17 [place], MD 03/20/17 [phone]

## 2017-03-20 NOTE — Progress Notes (Signed)
Pt has DNR signed and confirmed with pt and wife he wishes to be DNR. Dr. Ether Griffins paged regarding order for full code still in system. Verbal order for pt to be DNR placed. Will change order and continue to monitor pt.

## 2017-03-20 NOTE — Telephone Encounter (Signed)
Lelan Pons with [facility] called and reported that she has sent Dean Garcia out to ER.  States pt was having increased "shaking".  Pt requested to go out to ER.  He has a history of diabetes, spasms and bladder cancer.  Blood sugar 155.  VSS.  States pt requested to be transferred and had already been sent out to ER.  This was just FYI call.

## 2017-03-20 NOTE — Telephone Encounter (Signed)
Appears ready to be sent back to [facility] after some ativan. Urinalysis abnormal so will be treated empirically for possible UTI

## 2017-03-20 NOTE — Telephone Encounter (Signed)
Per chart review tab pt went to [facility] ED. 

## 2017-03-20 NOTE — Telephone Encounter (Signed)
Appears that he is going to be sent back to [facility] Will follow up there

## 2017-03-20 NOTE — Telephone Encounter (Signed)
PLEASE NOTE: All timestamps contained within this report are represented as [place] Standard Time. CONFIDENTIALTY NOTICE: This fax transmission is intended only for the addressee. It contains information that is legally privileged, confidential or otherwise protected from use or disclosure. If you are not the intended recipient, you are strictly prohibited from reviewing, disclosing, copying using or disseminating any of this information or taking any action in reliance on or regarding this information. If you have received this fax in error, please notify us immediately by telephone so that we can arrange for its return to [place]. Phone: [phone], Toll-Free: [phone], Fax: [phone] Page: 1 of 1 Call Id: [id number] [place] Patient Name: Jonathen WEBBER Gender: Male DOB: Apr 25, 1928 Age: 76 Y 74 M 7 D Return Phone Number: City/State/Zip: [place] Client [place] Night - Client Client Site [place] Physician Viviana Simpler - MD Who Is Calling Patient / Member / Family / Caregiver Call Type Triage / Clinical Caller Name Lelan Pons Relationship To Patient Care Giver Return Phone Number Please choose phone number Chief Complaint Muscle Jerks, Tics And Shudders Reason for Call Symptomatic / Request for [place] states she is Lelan Pons with [facility] @ [phone] reporting that Pt is shaking all over and is requesting to be sent to [facility]. Nurse Assessment Guidelines Guideline Title Affirmed Question Disp. Time Eilene Ghazi Time) Disposition Final User 03/20/2017 4:20:43 AM Clinical Call Yes Doren Custard, RN, [place] Paging DoctorName Phone DateTime Action Result/Outcome Notes Einar Pheasant - MD [phone] 03/20/2017 4:19:14 AM Doctor Paged Called On Call Provider - Teresita Madura -  MD 03/20/2017 4:20:31 AM Message Result Spoke with On Call - General Informed Dr. Nicki Reaper that Lelan Pons from [facility] wanted to make her aware that patient was sent to ED per his request because he was shaking all over. Dr. Nicki Reaper was conferenced in with Lelan Pons to discuss patient's condition. [place] Nurse then disconnected.

## 2017-03-21 LAB — BASIC METABOLIC PANEL
Anion gap: 6 (ref 5–15)
BUN: 39 mg/dL — AB (ref 6–20)
CHLORIDE: 107 mmol/L (ref 101–111)
CO2: 27 mmol/L (ref 22–32)
Calcium: 8.1 mg/dL — ABNORMAL LOW (ref 8.9–10.3)
Creatinine, Ser: 2.25 mg/dL — ABNORMAL HIGH (ref 0.61–1.24)
GFR calc Af Amer: 29 mL/min — ABNORMAL LOW (ref >60)
GFR, EST NON AFRICAN AMERICAN: 25 mL/min — AB (ref >60)
Glucose, Bld: 167 mg/dL — ABNORMAL HIGH (ref 65–99)
POTASSIUM: 3.5 mmol/L (ref 3.5–5.1)
SODIUM: 140 mmol/L (ref 135–145)

## 2017-03-21 LAB — CBC
HEMATOCRIT: 32.3 % — AB (ref 40.0–52.0)
HEMOGLOBIN: 10.5 g/dL — AB (ref 13.0–18.0)
MCH: 28 pg (ref 26.0–34.0)
MCHC: 32.6 g/dL (ref 32.0–36.0)
MCV: 85.8 fL (ref 80.0–100.0)
Platelets: 251 10*3/uL (ref 150–440)
RBC: 3.76 MIL/uL — AB (ref 4.40–5.90)
RDW: 15.7 % — AB (ref 11.5–14.5)
WBC: 6.2 10*3/uL (ref 3.8–10.6)

## 2017-03-21 LAB — ECHOCARDIOGRAM COMPLETE
HEIGHTINCHES: 73 in
WEIGHTICAEL: 2720 [oz_av]

## 2017-03-21 LAB — TROPONIN I
TROPONIN I: 0.03 ng/mL — AB (ref <0.03)
Troponin I: <0.03 ng/mL (ref <0.03)

## 2017-03-21 LAB — GLUCOSE, CAPILLARY
GLUCOSE-CAPILLARY: 118 mg/dL — AB (ref 65–99)
Glucose-Capillary: 140 mg/dL — ABNORMAL HIGH (ref 65–99)

## 2017-03-21 MED ORDER — HALOPERIDOL LACTATE 5 MG/ML IJ SOLN
INTRAMUSCULAR | Status: AC
  Administered 2017-03-21: 5 mg
  Filled 2017-03-21: qty 1

## 2017-03-21 MED ORDER — DIPHENHYDRAMINE HCL 50 MG/ML IJ SOLN
12.5000 mg | Freq: Once | INTRAMUSCULAR | Status: AC
  Administered 2017-03-21: 12.5 mg via INTRAVENOUS
  Filled 2017-03-21: qty 0.25

## 2017-03-21 MED ORDER — HALOPERIDOL LACTATE 5 MG/ML IJ SOLN: 1.0000 mg | Freq: Four times a day (QID) | INTRAMUSCULAR | Status: DC | PRN

## 2017-03-21 MED ORDER — MORPHINE SULFATE (PF) 4 MG/ML IV SOLN
2.0000 mg | Freq: Once | INTRAVENOUS | Status: AC
  Administered 2017-03-21: 2 mg via INTRAVENOUS
  Filled 2017-03-21: qty 1

## 2017-03-21 NOTE — Progress Notes (Signed)
[place] at [place] NAME: Dean Garcia    MR#:  [id number]  DATE OF BIRTH:  [birth year]  SUBJECTIVE: admitted for bradycardia/uncontrollable tremors. patient had urinary retention last night, in and out catheter was done because of urinary retention of 600 mL. Patient was agitated so he got Benadryl 12.5 mg 1 tablet morning. slept till 12 pm but after that started to climb out of bed   CHIEF COMPLAINT:   Chief Complaint  Patient presents with  . Shaking    REVIEW OF SYSTEMS:   Review of Systems  Unable to perform ROS: Dementia     DRUG ALLERGIES:   Allergies  Allergen Reactions  . Demerol [Meperidine] Nausea And Vomiting and Nausea Only    Other reaction(s): Nausea And Vomiting, Vomiting Nausea/vomiting    VITALS:  Blood pressure (!) 157/34, pulse (!) 41, temperature 97.1 F (36.2 C), temperature source Axillary, resp. rate 18, height 6\' 1"  (1.854 m), weight 76.2 kg (168 lb 1.6 oz), SpO2 98 %.  PHYSICAL EXAMINATION:  GENERAL:  83 y.o.-year-old patient lying in the bed ,agitated.   EYES: Pupils equal, round, reactive to light and accommodation. No scleral icterus. Extraocular muscles intact.  HEENT: Head atraumatic, normocephalic. Oropharynx and nasopharynx clear.  NECK:  Supple, no jugular venous distention. No thyroid enlargement, no tenderness.  LUNGS: Normal breath sounds bilaterally, no wheezing, rales,rhonchi or crepitation. No use of accessory muscles of respiration.  CARDIOVASCULAR: S1, S2 brady cardia rubs, or gallops. normal. No mu ABDOMEN: Soft, nontender, nondistended. Bowel sounds present. No organomegaly or mass.  EXTREMITIES: No pedal edema, cyanosis, or clubbing.  NEUROLOGIC: demented. not able to do full neurological exam.   PSYCHIATRIC: confused.agitated SKIN: No obvious rash, lesion, or ulcer.    LABORATORY PANEL:   CBC  Recent Labs Lab 03/21/17 0631  WBC 6.2  HGB 10.5*  HCT 32.3*  PLT 251    ------------------------------------------------------------------------------------------------------------------  Chemistries   Recent Labs Lab 03/20/17 0748 03/21/17 0631  NA  --  140  K  --  3.5  CL  --  107  CO2  --  27  GLUCOSE  --  167*  BUN  --  39*  CREATININE  --  2.25*  CALCIUM  --  8.1*  MG 2.5*  --    ------------------------------------------------------------------------------------------------------------------  Cardiac Enzymes  Recent Labs Lab 03/21/17 0631  TROPONINI <0.03   ------------------------------------------------------------------------------------------------------------------  RADIOLOGY:  Mr Cervical Spine Wo Contrast  Result Date: 03/20/2017 CLINICAL DATA:  Acute onset of shaking and weakness. EXAM: MRI CERVICAL SPINE WITHOUT CONTRAST TECHNIQUE: Multiplanar, multisequence MR imaging of the cervical spine was performed. No intravenous contrast was administered. COMPARISON:  None. FINDINGS: Alignment: Normal Vertebrae: No focal or acute bone finding. Cord: No cord compression or primary cord lesion. The cord does appear to show mild generalized atrophy, nonspecific. Posterior Fossa, vertebral arteries, paraspinal tissues: Chronic cerebellar atrophy. Disc levels: Foramen magnum and C1-2 are normal. C2-3: Bulging of the disc. Mild facet osteoarthritis. No compressive central canal stenosis. Mild bilateral foraminal stenosis. C3-4: Bulging of the disc. Bilateral facet osteoarthritis. No compressive central canal stenosis. Mild bilateral foraminal stenosis. C4-5: Endplate osteophytes and bulging of the disc. Bilateral facet osteoarthritis right more than left. No compressive central canal stenosis. Foraminal narrowing right worse than left. Right C5 nerve root could be affected. C5-6: Endplate osteophytes and bulging of the disc. No compressive central canal stenosis. Facet arthropathy on the right with edema. Foraminal stenosis on the right that could  affect the C6 nerve root. The facet joint could be a cause of right-sided neck pain. C6-7: Mild bulging of the disc. No canal or foraminal stenosis. Anterior projecting osteophytes. C7-T1:  Mild bulging of the disc.  No canal or foraminal stenosis. T1-2:  No significant finding. IMPRESSION: Mild cervical cord atrophy but without evidence of cord compression or focal cord lesion. Patient also has generalized cerebellar atrophy. Degenerative spondylosis and facet arthropathy throughout the cervical region. As noted above, there is no compressive canal stenosis. Edematous facet arthropathy on the right at C5-6 could be a cause of right-sided neck pain. Foraminal stenosis could cause neural compression at C4-5 (worse on the right) and on the right at C5-6. Electronically Signed   By: Nelson Chimes M.D.   On: 03/20/2017 15:35   Mr Lumbar Spine Wo Contrast  Result Date: 03/20/2017 CLINICAL DATA:  Generalized weakness and shaking. EXAM: MRI LUMBAR SPINE WITHOUT CONTRAST TECHNIQUE: Multiplanar, multisequence MR imaging of the lumbar spine was performed. No intravenous contrast was administered. COMPARISON:  None. FINDINGS: Segmentation:  5 lumbar type vertebral bodies. Alignment:  Minimal scoliotic curvature.  2 mm retrolisthesis L4-5. Vertebrae: No fracture or significant primary bone lesion. Possible L3 lipoma or hemangioma. Conus medullaris: Extends to the L1-2 level and appears normal. Paraspinal and other soft tissues: Renal cysts, not completely are primarily evaluated. Disc levels: No significant finding at L2-3 or above. Minimal disc bulges. No stenosis. L3-4: Shallow circumferential protrusion of the disc. Facet and ligamentous hypertrophy. Mild multifactorial stenosis but without definite neural compression. L4-5: Bulging of the disc more towards the right. Facet degeneration and hypertrophy more on the right. Mild stenosis of the right lateral recess without definite neural compression. L5-S1: Minimal disc  bulge. Mild facet osteoarthritis. No stenosis. IMPRESSION: No likely acute or significant finding in the lumbar spine relative to the clinical presentation. Ordinary lower lumbar degenerative changes. Mild stenosis of the lateral recesses bilaterally at L3-4 and on the right at L4-5 but without definite neural compression. Electronically Signed   By: Nelson Chimes M.D.   On: 03/20/2017 15:40    EKG:   Orders placed or performed during the hospital encounter of 03/20/17  . ED EKG  . ED EKG  . EKG 12-Lead  . EKG 12-Lead  . EKG 12-Lead  . EKG 12-Lead  . EKG 12-Lead  . EKG 12-Lead    ASSESSMENT AND PLAN:   1. Bradycardia: Hold AV nodal blocking agents, continue to monitor on telemetry,  Continue Rocephin, follow urine cultures #3 .severe dementia with agitation: Remeron, primidone for tremors,Cymbalta;use haldol for agitation.avoid benadryl 4.ckd stage 3;; kidney function #5 history of chronic atrial fibrillation: Stop  Av nodal because of bradycardia, continue blood thinners. Cardiology is following. 6 ,urine retention:insert foley  All the records are reviewed and case discussed with Care Management/Social Workerr. Management plans discussed with the patient, family and they are in agreement.  CODE STATUS: DNR  TOTAL TIME TAKING CARE OF THIS PATIENT:35 minutes.   POSSIBLE D/C IN 1-2 DAYS, DEPENDING ON CLINICAL CONDITION.   Epifanio Lesches M.D on 03/21/2017 at 1:04 PM  Between 7am to 6pm - Pager - [phone]  After 6pm go to [email or web address] - password [email or web address] [place] [facility]  Office  [phone]  CC: Primary care physician; Kirk Ruths., MD   Note: This dictation was prepared with Dragon dictation along with smaller phrase technology. Any transcriptional errors that result from this process are unintentional.

## 2017-03-21 NOTE — Consult Note (Signed)
[facility]  CARDIOLOGY CONSULT NOTE  Patient ID: Dean Garcia MRN: [id number] DOB/AGE: 1928/05/12 83 y.o.  Admit date: 03/20/2017 Referring Physician Dr. Vianne Bulls Primary Physician   Primary Cardiologist Dr. Ubaldo Glassing Reason for Consultation Bradycardia and elevated troponin  HPI: Patient is a 83 year old male with history of mitral valve disease, hypertension, hyperlipidemia, chronic atrial fibrillation as well as renal insufficiency, tremor was admitted with shortness of breath and elevated serum troponin. He was also relatively bradycardic. He has been on Eliquis 2.5 mg twice daily for anticoagulation. He has been on diltiazem 240 mg daily for his atrial fibrillation and has been on propranolol 10 mg twice daily for tremors. He has had preserved LV function by echo in the past done in November 2017 with an EF of 55% with moderate MR. Echocardiogram done yesterday revealed similar findings including normal LV function with mild MR and a fairly large left pleural effusion. There is no pericardial effusion. Patient is minimally responsive. He is fairly bradycardic since admission with ventricular escape at approximately 40 bpm. He has been hemodynamically stable with this. His sensorium has been reduced. He does not have any complaints but it is very difficult historian this morning  Review of Systems  Constitutional: Positive for malaise/fatigue.  HENT: Negative.   Eyes: Negative.   Respiratory: Positive for shortness of breath.   Cardiovascular: Negative.   Gastrointestinal: Negative.   Genitourinary: Negative.   Musculoskeletal: Negative.   Skin: Negative.   Neurological: Positive for weakness.    Past Medical History:  Diagnosis Date  . Atrial fibrillation ([place])   . Bladder cancer ([place])   . Bundle branch block, right   . Carotid stenosis   . Colon polyp   . CVA (cerebral infarction)   . Diabetes ([place])   . Gout   . High cholesterol   .  Hypertension   . Mitral valve prolapse   . Prostate cancer ([place])   . Tremor     Family History  Problem Relation Age of Onset  . Hypertension Father   . Stroke Father   . Diabetes Mother   . Prostate cancer Neg Hx   . Kidney cancer Neg Hx   . Kidney disease Neg Hx   . Bladder Cancer Neg Hx     Social History   Social History  . Marital status: Married    Spouse name: N/A  . Number of children: N/A  . Years of education: N/A   Occupational History  . Not on file.   Social History Main Topics  . Smoking status: Former Smoker    Types: Pipe    Quit date: 10/29/2010  . Smokeless tobacco: Never Used  . Alcohol use Yes     Comment: 1 beer a day  . Drug use: No  . Sexual activity: Not on file   Other Topics Concern  . Not on file   Social History Narrative  . No narrative on file    Past Surgical History:  Procedure Laterality Date  . APPENDECTOMY    . CAROTID ARTERY ANGIOPLASTY    . CHOLECYSTECTOMY       Prescriptions Prior to Admission  Medication Sig Dispense Refill Last Dose  . allopurinol (ZYLOPRIM) 100 MG tablet Take 100 mg by mouth daily.   03/19/2017 at 0800  . colchicine 0.6 MG tablet Take 0.6 mg by mouth 2 (two) times daily as needed (gout).    PRN at PRN  . cyanocobalamin (,VITAMIN B-12,) 1000 MCG/ML  injection Inject 1,000 mcg into the muscle every 30 (thirty) days. 14th of month   03/16/2017 at 0800  . docusate sodium (COLACE) 100 MG capsule Take 1 capsule (100 mg total) by mouth 2 (two) times daily. 10 capsule 0 03/19/2017 at 2000  . ELIQUIS 2.5 MG TABS tablet Take 2.5 mg by mouth 2 (two) times daily.     . fexofenadine (ALLEGRA) 180 MG tablet Take 180 mg by mouth daily as needed for allergies or rhinitis.   PRN at PRN  . mirtazapine (REMERON) 30 MG tablet Take 1 tablet (30 mg total) by mouth at bedtime. 30 tablet 0 03/19/2017 at 2000  . omeprazole (PRILOSEC) 20 MG capsule Take 20 mg by mouth daily.   03/19/2017 at 0800  . ondansetron (ZOFRAN-ODT) 4 MG  disintegrating tablet Take 4 mg by mouth every 8 (eight) hours as needed for nausea or vomiting.    PRN at PRN  . oxyCODONE-acetaminophen (PERCOCET) 7.5-325 MG tablet Take 1 tablet by mouth every 4 (four) hours as needed for severe pain. 20 tablet 0 PRN at PRN  . potassium chloride (K-DUR,KLOR-CON) 10 MEQ tablet Take 10 mEq by mouth daily.   03/19/2017 at 0800  . primidone (MYSOLINE) 50 MG tablet Take 50 mg by mouth 2 (two) times daily.    03/19/2017 at 2000  . ranitidine (ZANTAC) 150 MG tablet Take 300 mg by mouth every evening.    03/19/2017 at 2000  . sitaGLIPtin (JANUVIA) 25 MG tablet Take 25 mg by mouth daily.    03/19/2017 at 0800  . tamsulosin (FLOMAX) 0.4 MG CAPS capsule Take 1 capsule (0.4 mg total) by mouth daily. 90 capsule 3 03/19/2017 at 0900  . torsemide (DEMADEX) 20 MG tablet Take 20 mg by mouth 2 (two) times daily.   03/19/2017 at 2000  . traZODone (DESYREL) 50 MG tablet Take 50 mg by mouth at bedtime.   03/19/2017 at 2000  . amLODipine (NORVASC) 5 MG tablet Take 1 tablet (5 mg total) by mouth daily. (Patient not taking: Reported on 03/20/2017) 30 tablet 0 Not Taking at Unknown time  . DULoxetine (CYMBALTA) 30 MG capsule Take 1 capsule (30 mg total) by mouth daily. (Patient not taking: Reported on 03/20/2017) 30 capsule 0 Not Taking at Unknown time  . glucose blood (ACCU-CHEK COMPACT PLUS) test strip    Taking  . HYDROcodone-acetaminophen (NORCO) 10-325 MG tablet Take 1 tablet by mouth every 6 (six) hours as needed for moderate pain. (Patient not taking: Reported on 03/20/2017) 20 tablet 0 Not Taking at Unknown time  . Insulin Pen Needle (B-D ULTRAFINE III SHORT PEN) 31G X 8 MM MISC USE AS DIRECTED   Taking  . Wound Dressings (ALLANTOIN) gel Apply topically as needed for wound care.   Taking    Physical Exam: Blood pressure (!) 155/81, pulse (!) 44, temperature 97.1 F (36.2 C), temperature source Axillary, resp. rate (!) 22, height 6\' 1"  (1.854 m), weight 76.2 kg (168 lb 1.6 oz), SpO2 97 %.    Wt Readings from Last 1 Encounters:  03/21/17 76.2 kg (168 lb 1.6 oz)     General appearance: slowed mentation Resp: diminished breath sounds bibasilar Cardio: irregularly irregular rhythm Extremities: edema 2+ edema in lower extremities Neurologic: Mental status: alertness: lethargic  Labs:   Lab Results  Component Value Date   WBC 6.2 03/21/2017   HGB 10.5 (L) 03/21/2017   HCT 32.3 (L) 03/21/2017   MCV 85.8 03/21/2017   PLT 251 03/21/2017    Recent  Labs Lab 03/21/17 0631  NA 140  K 3.5  CL 107  CO2 27  BUN 39*  CREATININE 2.25*  CALCIUM 8.1*  GLUCOSE 167*   Lab Results  Component Value Date   CKTOTAL 149 02/27/2013   CKMB 1.0 05/25/2013   TROPONINI <0.03 03/21/2017       EKG: Atrial fibrillation with slow ventricular response with intermittent ventricular escape at approximately 40 bpm.  ASSESSMENT AND PLAN:  Patient is she'll male with history of multiple medical problems including atrial fibrillation, renal insufficiency, tremor, who is admitted with increasing shortness of breath and fatigue and is noted to have increasing shakiness prompting presentation. He had uncontrollable shaking on arrival. He was noted to have a urinary tract infection with a trivially elevated serum troponin. This was 0.03. This does not represent ischemia or acute coronary syndrome. His EKG showed bradycardia was taken off of diltiazem and propranolol. He has been on low-dose L Aquinas for anticoagulation. Will discontinue Eliquis for now in case pacing maker insertion as necessary. Will discontinue amlodipine to maintain good systolic blood pressure. We'll continue to follow his urinary tract symptoms. Shakiness has improved. His echocardiogram shows no change in his echo from late last year. His bradycardia is likely secondary to AV nodal drugs. Outpatient heart rates have been in the 40s. Signed: Teodoro Spray MD, [facility] 03/21/2017, 10:17 AM

## 2017-03-21 NOTE — Progress Notes (Signed)
Notified Dr. Jannifer Franklin of EKG results. Troponins ordered.

## 2017-03-21 NOTE — Progress Notes (Signed)
Heard patients bed alarm going off about 3:15 AM. Patient attempting to sit on side of bed but falling over and could not open eyes or answer any questions. Did not respond. DId not follow commands. Got patient back in bed. Appeared lethargic and agitated. Moving all extremities and attempting to get up but not opening eyes or responding.  Called rapid response. VSS except heart rate which has been in the 30s and 40s all night. BS 118. Dr. Marcille Blanco came to see patient. Bladder scan done and showed 550. IN and out cath produced 600. Patient still agitated. Benadryl ordered. Will continue to monitor.

## 2017-03-21 NOTE — Progress Notes (Signed)
Patient seen this morning on morning rounds.  There does not appear to be acute vascular compromise at this time. No emergent vascular intervention.  Full consult to follow

## 2017-03-21 NOTE — Progress Notes (Signed)
Rapid Response Event Note  Overview:    Called to room due to patient having a decrease in alertness by nurse.  Initial Focused Assessment: Pt was noted to be sleepy but arousal was not following commands . Pt BS was WNL , vital signs showed pt to have a bradycardia rate which was pt,.baseline sats wnl.pt labs were viewed and recent medications I have attempted to contact this patient by phone with the following results: call.was placed to MD from care nurse.    Interventions:  Plan of Care (if not transferred):MD was coming to assess.pt . Pt started to become more arousal while this nurse was still present. MD in route to beside 0315  Event Summary:   at  [place]     at          [facility]

## 2017-03-21 NOTE — Progress Notes (Signed)
Patient is very confused and agitated today.  Given haldol, inserted foley for retention, put mitts on to avoid patient from pulling at lines and foley.  Low bed ordered and safety mats in place at bedside.  Patient very impulsive.  Continue to monitor.

## 2017-03-21 NOTE — Progress Notes (Signed)
Moved patient to sizewise bed. Patient moaning and agitated, attempting to get out of bed but too weak to do so. BP 174/83. Notified Dr. Jannifer Franklin. 2mg  morphine ordered for pain. Will continue to monitor.

## 2017-03-22 ENCOUNTER — Inpatient Hospital Stay: Payer: Medicare Other

## 2017-03-22 DIAGNOSIS — I4891 Unspecified atrial fibrillation: Secondary | ICD-10-CM

## 2017-03-22 DIAGNOSIS — R259 Unspecified abnormal involuntary movements: Secondary | ICD-10-CM

## 2017-03-22 DIAGNOSIS — I7091 Generalized atherosclerosis: Secondary | ICD-10-CM

## 2017-03-22 LAB — URINE CULTURE: Culture: >=100000 — AB

## 2017-03-22 LAB — TSH: TSH: 6.539 u[IU]/mL — ABNORMAL HIGH (ref 0.350–4.500)

## 2017-03-22 LAB — AMMONIA: Ammonia: 14 umol/L (ref 9–35)

## 2017-03-22 LAB — GLUCOSE, CAPILLARY: Glucose-Capillary: 144 mg/dL — ABNORMAL HIGH (ref 65–99)

## 2017-03-22 MED ORDER — AMLODIPINE BESYLATE 5 MG PO TABS: 5.0000 mg | ORAL_TABLET | Freq: Every day | ORAL | Status: DC

## 2017-03-22 MED ORDER — APIXABAN 2.5 MG PO TABS: 2.5000 mg | ORAL_TABLET | Freq: Two times a day (BID) | ORAL | Status: DC

## 2017-03-22 NOTE — Progress Notes (Signed)
[facility] [place] NAME: Dean Garcia    MR#:  [id number]  DATE OF BIRTH:  07/26/1928  SUBJECTIVE:  CHIEF COMPLAINT:   Chief Complaint  Patient presents with  . Shaking   - non verbal today, no involuntary movements of arms and legs today - wife at bedside  REVIEW OF SYSTEMS:  Review of Systems  Unable to perform ROS: Mental status change    DRUG ALLERGIES:   Allergies  Allergen Reactions  . Demerol [Meperidine] Nausea And Vomiting and Nausea Only    Other reaction(s): Nausea And Vomiting, Vomiting Nausea/vomiting    VITALS:  Blood pressure (!) 169/87, pulse 60, temperature 97.8 F (36.6 C), resp. rate (!) 24, height 6\' 1"  (1.854 m), weight 77.1 kg (170 lb), SpO2 99 %.  PHYSICAL EXAMINATION:  Physical Exam  GENERAL:  83 y.o.-year-old patient lying in the bed, mouth breathing and appearing sick.  EYES: Pupils equal, round, reactive to light and accommodation. No scleral icterus. Extraocular muscles intact.  HEENT: Head atraumatic, normocephalic. Oropharynx and nasopharynx clear.  NECK:  Supple, no jugular venous distention. No thyroid enlargement, no tenderness.  LUNGS: Normal breath sounds bilaterally, no wheezing, rales,rhonchi or crepitation. No use of accessory muscles of respiration.  CARDIOVASCULAR: S1, S2 normal. No rubs, or gallops. 3/6 systolic murmur present ABDOMEN: Soft, nontender, nondistended. Bowel sounds present. No organomegaly or mass.  EXTREMITIES: No pedal edema, cyanosis, or clubbing.  NEUROLOGIC: opening eyes, but not following commands, non verbal PSYCHIATRIC: The patient is alert. But nonverbal  SKIN: No obvious rash, lesion, or ulcer.    LABORATORY PANEL:   CBC  Recent Labs Lab 03/21/17 0631  WBC 6.2  HGB 10.5*  HCT 32.3*  PLT 251   ------------------------------------------------------------------------------------------------------------------  Chemistries   Recent Labs Lab  03/20/17 0748 03/21/17 0631  NA  --  140  K  --  3.5  CL  --  107  CO2  --  27  GLUCOSE  --  167*  BUN  --  39*  CREATININE  --  2.25*  CALCIUM  --  8.1*  MG 2.5*  --    ------------------------------------------------------------------------------------------------------------------  Cardiac Enzymes  Recent Labs Lab 03/21/17 1227  TROPONINI 0.03*   ------------------------------------------------------------------------------------------------------------------  RADIOLOGY:  Mr Brain Wo Contrast  Result Date: 03/22/2017 CLINICAL DATA:  83 year old male with altered mental status and stroke-like symptoms 2 nights ago, including unexplained tremor or shaking. EXAM: MRI HEAD WITHOUT CONTRAST TECHNIQUE: Multiplanar, multiecho pulse sequences of the brain and surrounding structures were obtained without intravenous contrast. COMPARISON:  Cervical spine MRI 03/20/2017.  Brain MRI 04/27/2011. FINDINGS: Brain: No restricted diffusion to suggest acute infarction. No midline shift, mass effect, evidence of mass lesion, ventriculomegaly, extra-axial collection or acute intracranial hemorrhage. Cervicomedullary junction and pituitary are within normal limits. Mild further generalized cerebral volume loss since 2012. Cerebral volume is at the lower limits of normal for age. Pearline Cables and white matter signal is stable and largely normal for age throughout the brain. Minimal to mild nonspecific white matter and deep gray matter nuclei T2 and FLAIR heterogeneity. No cortical encephalomalacia or discrete prior infarct identified. Vascular: Major intracranial vascular flow voids are stable since 2014 and within normal limits. Skull and upper cervical spine: Stable from recent cervical spine MRI. Sinuses/Orbits: Stable since 2012. Negative orbits soft tissues. Combined mucosal thickening and inspissated material in the left maxillary sinus. Other paranasal sinuses are well pneumatized. Other: Mastoids are clear.  Visible internal auditory structures appear  normal. Negative scalp soft tissues. IMPRESSION: 1. No acute intracranial abnormality, and largely unremarkable for age noncontrast MRI appearance of the brain. 2. Chronic left maxillary sinusitis. Mildly improved paranasal sinus aeration since 2012. Electronically Signed   By: Genevie Ann M.D.   On: 03/22/2017 13:02    EKG:   Orders placed or performed during the hospital encounter of 03/20/17  . ED EKG  . ED EKG  . EKG 12-Lead  . EKG 12-Lead  . EKG 12-Lead  . EKG 12-Lead  . EKG 12-Lead  . EKG 12-Lead    ASSESSMENT AND PLAN:   83 year old male with past medical history significant for hypertension, hyperlipidemia, chronic atrial fibrillation, CK D admitted to hospital from [facility] long-term care due to worsening muscle spasms and altered mental status.  #1 AMS- metabolic encephalopathy. Acute cystitis - f/u cultures - on rocephin - MRI brain and neuro consult ordered as clinical decline noted, f/u ammonia level - DC baclofen  #2 Restless leg syndrome- follows with neurology as outpatient - appreciate vascular input as well - MRI thoracic and lumbar spine without any significant findings other than DJD - baclofen held today as not responding  #3 Depression-  Prior h/o suicide attempt. Hold remeron as lethargic  #4 Chronic afib- rate controlled, on eliquis  #5 HTN- norvasc  #6 DVT prophylaxis- eliquis  Palliative care consulted due to poor prognosis    All the records are reviewed and case discussed with Care Management/Social Workerr. Management plans discussed with the patient, family and they are in agreement.  CODE STATUS:  DNR  TOTAL TIME TAKING CARE OF THIS PATIENT: 38 minutes.   POSSIBLE D/C IN 2 DAYS, DEPENDING ON CLINICAL CONDITION.   Seibert Keeter M.D on 03/22/2017 at 4:35 PM  Between 7am to 6pm - Pager - [phone]  After 6pm go to [email or web address] - password [email or web address]    Office  [phone]  CC: Primary care physician; Kirk Ruths., MD

## 2017-03-22 NOTE — Progress Notes (Signed)
Palliative Medicine RN Note: Meeting for PMT evaluation. Waiting on results from MRI. Wife would like to have all that information before having discussion re: GOC, and PMT is not here over the weekend. Should pt remain in [facility] on Monday 4/23, PMT provider will see then. If pt is d/c before then, recommend at least outpt palliative care consult for [facility] meeting. Pt is likely hospice eligible if family is agreeable to hospice approach.   Marjie Skiff Essence Merle, RN, BSN, [facility] 03/22/2017 4:13 PM Cell [phone] 8:00-4:00 Monday-Friday Office [phone]

## 2017-03-22 NOTE — Care Management (Signed)
Patient is resident of [place].  Patient was currently in the health care building for some skilled care.  He is altered at present and requiring [place].  he does have dementia but patient is not at his baseline and this change apparently was sudeen.  MRI is negative.  Neuro consult pending

## 2017-03-22 NOTE — Plan of Care (Signed)
Neurology called to inform they would see patient 4/21.

## 2017-03-22 NOTE — Consult Note (Signed)
[facility] Vascular Consult Note  MRN : [id number]  Dean Garcia is a 83 y.o. ([birth year]) male who presents with chief complaint of  Chief Complaint  Patient presents with  . Shaking   History of Present Illness:  83 year old male with history of mitral valve disease, hypertension, hyperlipidemia, chronic atrial fibrillation as well as renal insufficiency, tremor on Eliquis 2.5mg  BID known to me through clinic. Seen about three weeks ago in clinic experiencing lower extremity pain and swelling. At the time, he described his pain as bilateral lower extremity "muscle spasms". Had failed numerous muscle spasm medications. He was noted to have significant edema and at the time we applied for a lymphedema pump.   Please note, my information was obtained from the patients wife, as the patient is now non-responsive. As per the wife, the patient experienced what she feels was "like a stroke" two nights ago. States at 230 am on 03/20/17, the patient began to "shake". He was responsive at that point and wanted to go to the [facility] ED for further work up. Over the last two days, wife states patients "alertness" seems to have decreased. As per notes, patient with rapid response on 03/21/17 for "unresponsiveness". Now in mittens. Wife states the patient intermittently "thrashes around". As per wife, primary team will be obtaining MRI of head today. Based on findings, patient may be transitioned to palliative care.   Consulted by primary team, Dr. Ether Griffins for possible PVD recommendations.   Current Facility-Administered Medications  Medication Dose Route Frequency Provider Last Rate Last Dose  . acetaminophen (TYLENOL) tablet 650 mg  650 mg Oral Q6H PRN Max Sane, MD       Or  . acetaminophen (TYLENOL) suppository 650 mg  650 mg Rectal Q6H PRN Max Sane, MD      . allopurinol (ZYLOPRIM) tablet 100 mg  100 mg Oral Daily Max Sane, MD   100 mg at 03/20/17 1611  . amLODipine (NORVASC)  tablet 5 mg  5 mg Oral Daily Teodoro Spray, MD      . apixaban Arne [place]) tablet 2.5 mg  2.5 mg Oral BID Teodoro Spray, MD      . aspirin EC tablet 81 mg  81 mg Oral Daily Max Sane, MD   81 mg at 03/20/17 1610  . baclofen (LIORESAL) tablet 10 mg  10 mg Oral TID Theodoro Grist, MD   10 mg at 03/20/17 2228  . cefTRIAXone (ROCEPHIN) 1 g in dextrose 5 % 50 mL IVPB  1 g Intravenous Q24H Vipul Shah, MD 120 mL/hr at 03/22/17 1000 1 g at 03/22/17 1000  . [START ON 04/15/2017] cyanocobalamin ((VITAMIN B-12)) injection 1,000 mcg  1,000 mcg Intramuscular Q30 days Max Sane, MD      . docusate sodium (COLACE) capsule 100 mg  100 mg Oral BID Max Sane, MD   100 mg at 03/20/17 2228  . DULoxetine (CYMBALTA) DR capsule 30 mg  30 mg Oral Daily Max Sane, MD   30 mg at 03/20/17 1610  . finasteride (PROSCAR) tablet 5 mg  5 mg Oral Daily Theodoro Grist, MD   5 mg at 03/20/17 1610  . furosemide (LASIX) injection 40 mg  40 mg Intravenous Q12H Theodoro Grist, MD   40 mg at 03/22/17 0251  . haloperidol lactate (HALDOL) injection 1 mg  1 mg Intravenous Q6H PRN Epifanio Lesches, MD      . loratadine (CLARITIN) tablet 10 mg  10 mg Oral Daily Vipul Manuella Ghazi,  MD   10 mg at 03/20/17 1611  . mirtazapine (REMERON) tablet 30 mg  30 mg Oral QHS Max Sane, MD   30 mg at 03/20/17 2228  . ondansetron (ZOFRAN-ODT) disintegrating tablet 4 mg  4 mg Oral Q8H PRN Vipul Shah, MD      . pantoprazole (PROTONIX) EC tablet 40 mg  40 mg Oral Daily Max Sane, MD   40 mg at 03/20/17 1611  . potassium chloride (K-DUR,KLOR-CON) CR tablet 10 mEq  10 mEq Oral Daily Max Sane, MD   10 mEq at 03/20/17 1610  . primidone (MYSOLINE) tablet 50 mg  50 mg Oral BID Max Sane, MD   50 mg at 03/20/17 2228  . sodium chloride flush (NS) 0.9 % injection 3 mL  3 mL Intravenous Q12H Vipul Shah, MD   3 mL at 03/22/17 1000  . tamsulosin (FLOMAX) capsule 0.4 mg  0.4 mg Oral Daily Max Sane, MD   0.4 mg at 03/20/17 1610   Past Medical History:  Diagnosis Date   . Atrial fibrillation ([place])   . Bladder cancer ([place])   . Bundle branch block, right   . Carotid stenosis   . Colon polyp   . CVA (cerebral infarction)   . Diabetes ([place])   . Gout   . High cholesterol   . Hypertension   . Mitral valve prolapse   . Prostate cancer ([place])   . Tremor    Past Surgical History:  Procedure Laterality Date  . APPENDECTOMY    . CAROTID ARTERY ANGIOPLASTY    . CHOLECYSTECTOMY     Social History Social History  Substance Use Topics  . Smoking status: Former Smoker    Types: Pipe    Quit date: 10/29/2010  . Smokeless tobacco: Never Used  . Alcohol use Yes     Comment: 1 beer a day   Family History Family History  Problem Relation Age of Onset  . Hypertension Father   . Stroke Father   . Diabetes Mother   . Prostate cancer Neg Hx   . Kidney cancer Neg Hx   . Kidney disease Neg Hx   . Bladder Cancer Neg Hx   Unable to obtain and the patient is unresponsive.   Allergies  Allergen Reactions  . Demerol [Meperidine] Nausea And Vomiting and Nausea Only    Other reaction(s): Nausea And Vomiting, Vomiting Nausea/vomiting   REVIEW OF SYSTEMS (Negative unless checked)  Taken from notes and wife  Constitutional: [] Weight loss  [] Fever  [] Chills Cardiac: [] Chest pain   [] Chest pressure   [] Palpitations   [] Shortness of breath when laying flat   [] Shortness of breath at rest   [] Shortness of breath with exertion. Vascular:  [] Pain in legs with walking   [] Pain in legs at rest   [] Pain in legs when laying flat   [] Claudication   [] Pain in feet when walking  [] Pain in feet at rest  [] Pain in feet when laying flat   [] History of DVT   [] Phlebitis   [x] Swelling in legs   [] Varicose veins   [] Non-healing ulcers Pulmonary:   [] Uses home oxygen   [] Productive cough   [] Hemoptysis   [] Wheeze  [] COPD   [] Asthma Neurologic:  [] Dizziness  [] Blackouts   [] Seizures   [] History of stroke   [] History of TIA  [] Aphasia   [] Temporary blindness   [] Dysphagia    [] Weakness or numbness in arms   [x] Weakness or numbness in legs Musculoskeletal:  [] Arthritis   [] Joint swelling   []   Joint pain   [] Low back pain Hematologic:  [] Easy bruising  [] Easy bleeding   [] Hypercoagulable state   [] Anemic  [] Hepatitis Gastrointestinal:  [] Blood in stool   [] Vomiting blood  [] Gastroesophageal reflux/heartburn   [] Difficulty swallowing. Genitourinary:  [x] Chronic kidney disease   [] Difficult urination  [] Frequent urination  [] Burning with urination   [] Blood in urine Skin:  [] Rashes   [] Ulcers   [] Wounds Psychological:  [] History of anxiety   []  History of major depression.  Physical Examination  Vitals:   03/21/17 1041 03/21/17 1422 03/21/17 2246 03/22/17 0404  BP: (!) 157/34 (!) 172/42 (!) 174/82 (!) 169/87  Pulse: (!) 41 81 (!) 53 60  Resp: 18 18 (!) 22 (!) 24  Temp:  98.2 F (36.8 C) 97.9 F (36.6 C) 97.8 F (36.6 C)  TempSrc:      SpO2: 98% 98% 99% 99%  Weight:    170 lb (77.1 kg)  Height:       Body mass index is 22.43 kg/m. Gen:  Sleeping comfortably, Un-responsive Head: [place]/AT, No temporalis wasting. Prominent temp pulse not noted. Ear/Nose/Throat: Nares w/o erythema or drainage Eyes: Sclera non-icteric, conjunctiva clear Neck: Trachea midline.  No JVD.  Pulmonary:  Good air movement, respirations not labored, equal bilaterally.  Cardiac: RRR, normal S1, S2. Vascular:  Vessel Right Left  Radial Palpable Palpable  Ulnar Palpable Palpable  Brachial Palpable Palpable  Carotid Palpable, without bruit Palpable, without bruit  Aorta Not palpable N/A  Femoral Palpable Palpable  Popliteal Palpable Palpable  PT Hard to appreciate Hard to appreciate  DP Hard to appreciate Hard to appreciate   Gastrointestinal: soft, non-tender/non-distended.   Musculoskeletal: Extremities without ischemic changes. No deformity or atrophy. No edema. Neurologic: Symmetrical.  Psychiatric: Un-responsive Dermatologic: No rashes or ulcers noted.  No cellulitis or open  wounds. Lymph : No Cervical, Axillary, or Inguinal lymphadenopathy.  CBC Lab Results  Component Value Date   WBC 6.2 03/21/2017   HGB 10.5 (L) 03/21/2017   HCT 32.3 (L) 03/21/2017   MCV 85.8 03/21/2017   PLT 251 03/21/2017   BMET    Component Value Date/Time   NA 140 03/21/2017 0631   NA 140 03/02/2015 2019   K 3.5 03/21/2017 0631   K 3.1 (L) 03/02/2015 2019   CL 107 03/21/2017 0631   CL 98 (L) 03/02/2015 2019   CO2 27 03/21/2017 0631   CO2 29 03/02/2015 2019   GLUCOSE 167 (H) 03/21/2017 0631   GLUCOSE 149 (H) 03/02/2015 2019   BUN 39 (H) 03/21/2017 0631   BUN 62 (H) 03/02/2015 2019   CREATININE 2.25 (H) 03/21/2017 0631   CREATININE 2.14 (H) 03/02/2015 2019   CALCIUM 8.1 (L) 03/21/2017 0631   CALCIUM 8.1 (L) 03/02/2015 2019   GFRNONAA 25 (L) 03/21/2017 0631   GFRNONAA 27 (L) 03/02/2015 2019   GFRAA 29 (L) 03/21/2017 0631   GFRAA 32 (L) 03/02/2015 2019   Estimated Creatinine Clearance: 25.7 mL/min (A) (by C-G formula based on SCr of 2.25 mg/dL (H)).  COAG Lab Results  Component Value Date   INR 1.00 02/22/2017   INR 1.06 02/14/2016   INR 1.01 02/12/2016   Radiology Ct Pelvis Wo Contrast  Result Date: 02/26/2017 CLINICAL DATA:  Perineal pain, recent prostatitis EXAM: CT PELVIS WITHOUT CONTRAST TECHNIQUE: Multidetector CT imaging of the pelvis was performed following the standard protocol without intravenous contrast. COMPARISON:  CT scan 03/02/2015 FINDINGS: Urinary Tract: There is a Foley catheter within decompressed urinary bladder. Mild thickening of urinary bladder wall  wall. Mild cystitis or chronic inflammation cannot be excluded. Tiny foci of air within anterior aspect of urinary bladder could represent infection or recent instrumentation. No definite blood products are identified within urinary bladder. Bowel: Moderate stool noted within visualized right colon. The terminal ileum is unremarkable. Some colonic stool noted within distending colon and sigmoid colon.  Multiple sigmoid colon diverticula. No evidence of acute diverticulitis or acute colitis. No distal colonic obstruction. Visualized distal small bowel loops are unremarkable. Vascular/Lymphatic: Atherosclerotic calcifications bilateral iliac arteries are noted. There is aneurysmal dilatation of left iliac artery measures 2.1 cm in diameter Reproductive:  Prostate gland is unremarkable. Other: Small bilateral hydrocele. There is mild subcutaneous edema/ fluid along the scrotal wall. No pelvic free air or pelvic ascites. Musculoskeletal: No destructive bony lesions are noted. Mild degenerative changes bilateral SI joints. Degenerative changes are noted lumbar spine. Coronal images shows mild degenerative changes bilateral hip joint IMPRESSION: 1. There is a Foley catheter within decompressed urinary bladder. Thickening of urinary bladder wall may be due to chronic inflammation or cystitis. Small foci of air within anterior aspect urinary bladder may be due to infection or recent instrumentation. 2. Sigmoid colon diverticula are noted. No evidence of acute diverticulitis. No colonic obstruction. Visualized distal small bowel is unremarkable. 3. Small bilateral hydrocele. Small edema/fluid noted along the scrotal wall. No significant scrotal distension. No scrotal air-fluid levels. Electronically Signed   By: Lahoma Crocker M.D.   On: 02/26/2017 20:08   Mr Cervical Spine Wo Contrast  Result Date: 03/20/2017 CLINICAL DATA:  Acute onset of shaking and weakness. EXAM: MRI CERVICAL SPINE WITHOUT CONTRAST TECHNIQUE: Multiplanar, multisequence MR imaging of the cervical spine was performed. No intravenous contrast was administered. COMPARISON:  None. FINDINGS: Alignment: Normal Vertebrae: No focal or acute bone finding. Cord: No cord compression or primary cord lesion. The cord does appear to show mild generalized atrophy, nonspecific. Posterior Fossa, vertebral arteries, paraspinal tissues: Chronic cerebellar atrophy. Disc  levels: Foramen magnum and C1-2 are normal. C2-3: Bulging of the disc. Mild facet osteoarthritis. No compressive central canal stenosis. Mild bilateral foraminal stenosis. C3-4: Bulging of the disc. Bilateral facet osteoarthritis. No compressive central canal stenosis. Mild bilateral foraminal stenosis. C4-5: Endplate osteophytes and bulging of the disc. Bilateral facet osteoarthritis right more than left. No compressive central canal stenosis. Foraminal narrowing right worse than left. Right C5 nerve root could be affected. C5-6: Endplate osteophytes and bulging of the disc. No compressive central canal stenosis. Facet arthropathy on the right with edema. Foraminal stenosis on the right that could affect the C6 nerve root. The facet joint could be a cause of right-sided neck pain. C6-7: Mild bulging of the disc. No canal or foraminal stenosis. Anterior projecting osteophytes. C7-T1:  Mild bulging of the disc.  No canal or foraminal stenosis. T1-2:  No significant finding. IMPRESSION: Mild cervical cord atrophy but without evidence of cord compression or focal cord lesion. Patient also has generalized cerebellar atrophy. Degenerative spondylosis and facet arthropathy throughout the cervical region. As noted above, there is no compressive canal stenosis. Edematous facet arthropathy on the right at C5-6 could be a cause of right-sided neck pain. Foraminal stenosis could cause neural compression at C4-5 (worse on the right) and on the right at C5-6. Electronically Signed   By: Nelson Chimes M.D.   On: 03/20/2017 15:35   Mr Lumbar Spine Wo Contrast  Result Date: 03/20/2017 CLINICAL DATA:  Generalized weakness and shaking. EXAM: MRI LUMBAR SPINE WITHOUT CONTRAST TECHNIQUE: Multiplanar, multisequence MR imaging  of the lumbar spine was performed. No intravenous contrast was administered. COMPARISON:  None. FINDINGS: Segmentation:  5 lumbar type vertebral bodies. Alignment:  Minimal scoliotic curvature.  2 mm  retrolisthesis L4-5. Vertebrae: No fracture or significant primary bone lesion. Possible L3 lipoma or hemangioma. Conus medullaris: Extends to the L1-2 level and appears normal. Paraspinal and other soft tissues: Renal cysts, not completely are primarily evaluated. Disc levels: No significant finding at L2-3 or above. Minimal disc bulges. No stenosis. L3-4: Shallow circumferential protrusion of the disc. Facet and ligamentous hypertrophy. Mild multifactorial stenosis but without definite neural compression. L4-5: Bulging of the disc more towards the right. Facet degeneration and hypertrophy more on the right. Mild stenosis of the right lateral recess without definite neural compression. L5-S1: Minimal disc bulge. Mild facet osteoarthritis. No stenosis. IMPRESSION: No likely acute or significant finding in the lumbar spine relative to the clinical presentation. Ordinary lower lumbar degenerative changes. Mild stenosis of the lateral recesses bilaterally at L3-4 and on the right at L4-5 but without definite neural compression. Electronically Signed   By: Nelson Chimes M.D.   On: 03/20/2017 15:40   Ct Angio Aortobifemoral W And/or Wo Contrast  Result Date: 03/12/2017 CLINICAL DATA:  Bilateral lower extremity pain. EXAM: CT ANGIOGRAPHY OF ABDOMINAL AORTA WITH ILIOFEMORAL RUNOFF TECHNIQUE: Multidetector CT imaging of the abdomen, pelvis and lower extremities was performed using the standard protocol during bolus administration of intravenous contrast. Multiplanar CT image reconstructions and MIPs were obtained to evaluate the vascular anatomy. CONTRAST:  100 mL Isovue 370 intravenously. COMPARISON:  CT scan of March 02, 2015. FINDINGS: VASCULAR Aorta: Atherosclerosis of abdominal aorta is noted without aneurysm or dissection. Celiac: Patent without evidence of aneurysm, dissection, vasculitis or significant stenosis. SMA: Patent without evidence of aneurysm, dissection, vasculitis or significant stenosis. Renals:  Calcified atherosclerotic plaque is seen at the origin the right renal artery resulting in severe stenosis. There also appears to be severe stenosis at the origin of the left renal artery, also secondary to calcified atherosclerotic plaque. IMA: Appears to be patent, but appears to have severe stenosis at the origins secondary to aortic wall calcifications. Lower extremities. Inflow: No significant stenosis is noted. Atherosclerosis of iliac arteries is noted. 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 Outflow: Common, superficial and profunda femoral arteries and the popliteal artery are patent without evidence of aneurysm, dissection, vasculitis or significant stenosis. However, atherosclerotic calcifications are noted throughout the courses of these vessels. Runoff: There is complete occlusion at the origins of the right posterior tibial and peroneal branches. The left anterior tibial artery flows to the ankle mortise. Atherosclerotic calcifications are noted throughout these vessels. Complete occlusion is noted at the origin of the left peroneal and posterior tibial arteries. The left anterior tibial artery is seen flowing toward the ankle mortise. Atherosclerotic calcifications are noted throughout these vessels. NON-VASCULAR Lower chest: Mild bilateral pleural effusions are noted. Moderate size hiatal hernia is noted. Hepatobiliary: Status post cholecystectomy. Stable right hepatic cysts are noted. 2.2 cm low density is noted in left hepatic lobe which is significantly increased in size compared to prior exam. Pancreas: Unremarkable. No pancreatic ductal dilatation or surrounding inflammatory changes. Spleen: Normal in size without focal abnormality. Adrenals/Urinary Tract: Adrenal glands are unremarkable. Stable bilateral large renal cysts are noted. No hydronephrosis or renal obstruction is noted. No renal or ureteral calculi are noted. Mild urinary  bladder distention is noted. Stomach/Bowel: Sigmoid diverticulosis is noted without inflammation. There  is no evidence of bowel obstruction. Lymphatic: Aortic atherosclerosis. No enlarged abdominal or pelvic lymph nodes. Reproductive: Prostate is unremarkable. Other: Mild to moderate subcutaneous edema is noted in both lower extremities. Musculoskeletal: No acute or significant osseous findings. IMPRESSION: VASCULAR Aortic atherosclerosis. Atherosclerosis is noted throughout the visualized vessels of the lower extremities. 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 No significant stenosis is noted involving the abdominal aorta, iliac, femoral or popliteal arteries. However, there is complete occlusion of the proximal portions of the posterior tibial and peroneal arteries bilaterally, with 1 vessel runoff noted bilaterally through patent anterior tibial arteries. Severe stenoses are noted at the origins of both renal artery secondary to calcified atherosclerotic plaque. NON-VASCULAR Mild bilateral pleural effusions. Moderate size hiatal hernia. Sigmoid diverticulosis without inflammation. 2.2 cm low density seen in left hepatic lobe which is significantly increased in size compared to prior exam. Further evaluation with MRI with and without gadolinium administration is recommended. Electronically Signed   By: Marijo Conception, M.D.   On: 03/12/2017 07:45   Assessment/Plan 83 year old male with history of mitral valve disease, hypertension, hyperlipidemia, chronic atrial fibrillation as well as renal insufficiency, tremor - declining mental status  1. PVD - Patient with one vessel run off bilaterally. No intervention at this time. It is unlikely that the patients "spams" were vascular in nature. If patients mental status improves would be happy to follow as outpatient.  2. Neurological Consult: Would consider consulting neurology for patients mental status change and input as to may  be causing patients "spasm". Agree with head MRI.  3. Encouraged good control of HTN, HLD and they effect aneurysmal disease, renal function and progression of atherosclerotic disease.  Discussed with Dr. Francene Castle, PA-C  03/22/2017 11:35 AM

## 2017-03-22 NOTE — Progress Notes (Signed)
[place]  SUBJECTIVE: lethargic. Complaining of pain.    Vitals:   03/21/17 1041 03/21/17 1422 03/21/17 2246 03/22/17 0404  BP: (!) 157/34 (!) 172/42 (!) 174/82 (!) 169/87  Pulse: (!) 41 81 (!) 53 60  Resp: 18 18 (!) 22 (!) 24  Temp:  98.2 F (36.8 C) 97.9 F (36.6 C) 97.8 F (36.6 C)  TempSrc:      SpO2: 98% 98% 99% 99%  Weight:    77.1 kg (170 lb)  Height:        Intake/Output Summary (Last 24 hours) at 03/22/17 0808 Last data filed at 03/22/17 0600  Gross per 24 hour  Intake               60 ml  Output              500 ml  Net             -440 ml    LABS: Basic Metabolic Panel:  Recent Labs  03/20/17 0445 03/20/17 0748 03/21/17 0631  NA 138  --  140  K 4.6  --  3.5  CL 104  --  107  CO2 27  --  27  GLUCOSE 132*  --  167*  BUN 38*  --  39*  CREATININE 2.43*  --  2.25*  CALCIUM 8.2*  --  8.1*  MG  --  2.5*  --    Liver Function Tests: No results for input(s): AST, ALT, ALKPHOS, BILITOT, PROT, ALBUMIN in the last 72 hours. No results for input(s): LIPASE, AMYLASE in the last 72 hours. CBC:  Recent Labs  03/20/17 0445 03/21/17 0631  WBC 6.2 6.2  NEUTROABS 4.5  --   HGB 11.2* 10.5*  HCT 34.9* 32.3*  MCV 84.7 85.8  PLT 302 251   Cardiac Enzymes:  Recent Labs  03/21/17 0048 03/21/17 0631 03/21/17 1227  TROPONINI <0.03 <0.03 0.03*   BNP: Invalid input(s): POCBNP D-Dimer: No results for input(s): DDIMER in the last 72 hours. Hemoglobin A1C: No results for input(s): HGBA1C in the last 72 hours. Fasting Lipid Panel: No results for input(s): CHOL, HDL, LDLCALC, TRIG, CHOLHDL, LDLDIRECT in the last 72 hours. Thyroid Function Tests: No results for input(s): TSH, T4TOTAL, T3FREE, THYROIDAB in the last 72 hours.  Invalid input(s): FREET3 Anemia Panel: No results for input(s): VITAMINB12, FOLATE, FERRITIN, TIBC, IRON, RETICCTPCT in the last 72 hours.   Physical Exam: Blood pressure (!) 169/87, pulse 60,  temperature 97.8 F (36.6 C), resp. rate (!) 24, height 6\' 1"  (1.854 m), weight 77.1 kg (170 lb), SpO2 99 %.   Wt Readings from Last 1 Encounters:  03/22/17 77.1 kg (170 lb)     General appearance: moderate distress and slowed mentation Resp: rhonchi bibasilar Cardio: irregularly irregular rhythm GI: soft, non-tender; bowel sounds normal; no masses,  no organomegaly Extremities: complains of pain Neurologic: Mental status: alertness: lethargic  TELEMETRY: Reviewed telemetry pt in afib with slow vr. Improved from previous 24 hours. :  ASSESSMENT AND PLAN:  Active Problems:   Elevated troponin-appears to be demand ischemia. No evidence of ACS.     Sepsis (HCC)-on abx    Bradycardia-heart rate improved off of av nodal drugs. WIll continue to follow. Would add back 2.5 eliquis bid for now. Does not appear to require ppm at present.     Acute lower UTI-continue with abx      CKD (chronic kidney disease), stage IV (HCC)-stable. Will renal dose  meds.     Swelling of lower extremity-multifactoral. No change.   Hypertension-likely due to pain but will add amlodipine 5 mg daily and follow.     Teodoro Spray, MD, [facility] 03/22/2017 8:08 AM

## 2017-03-22 NOTE — Care Management Important Message (Signed)
Important Message  Patient Details  Name: Dean Garcia MRN: [id number] Date of Birth: Aug 13, 1928   Medicare Important Message Given:  Yes    Katrina Stack, RN 03/22/2017, 8:33 AM

## 2017-03-23 DIAGNOSIS — G9341 Metabolic encephalopathy: Secondary | ICD-10-CM

## 2017-03-23 DIAGNOSIS — N390 Urinary tract infection, site not specified: Secondary | ICD-10-CM

## 2017-03-23 LAB — CBC
HCT: 36.4 % — ABNORMAL LOW (ref 40.0–52.0)
Hemoglobin: 11.7 g/dL — ABNORMAL LOW (ref 13.0–18.0)
MCH: 27.2 pg (ref 26.0–34.0)
MCHC: 32.2 g/dL (ref 32.0–36.0)
MCV: 84.6 fL (ref 80.0–100.0)
Platelets: 314 10*3/uL (ref 150–440)
RBC: 4.3 MIL/uL — ABNORMAL LOW (ref 4.40–5.90)
RDW: 15.8 % — ABNORMAL HIGH (ref 11.5–14.5)
WBC: 10.1 10*3/uL (ref 3.8–10.6)

## 2017-03-23 LAB — COMPREHENSIVE METABOLIC PANEL
ALBUMIN: 3.2 g/dL — AB (ref 3.5–5.0)
ALK PHOS: 68 U/L (ref 38–126)
ALT: 12 U/L — ABNORMAL LOW (ref 17–63)
AST: 30 U/L (ref 15–41)
Anion gap: 14 (ref 5–15)
BUN: 45 mg/dL — ABNORMAL HIGH (ref 6–20)
CALCIUM: 8.5 mg/dL — AB (ref 8.9–10.3)
CO2: 24 mmol/L (ref 22–32)
Chloride: 108 mmol/L (ref 101–111)
Creatinine, Ser: 2.49 mg/dL — ABNORMAL HIGH (ref 0.61–1.24)
GFR calc non Af Amer: 22 mL/min — ABNORMAL LOW (ref >60)
GFR, EST AFRICAN AMERICAN: 25 mL/min — AB (ref >60)
GLUCOSE: 143 mg/dL — AB (ref 65–99)
Potassium: 3.5 mmol/L (ref 3.5–5.1)
SODIUM: 146 mmol/L — AB (ref 135–145)
Total Bilirubin: 0.9 mg/dL (ref 0.3–1.2)
Total Protein: 6.6 g/dL (ref 6.5–8.1)

## 2017-03-23 LAB — GLUCOSE, CAPILLARY: Glucose-Capillary: 139 mg/dL — ABNORMAL HIGH (ref 65–99)

## 2017-03-23 MED ORDER — AMIODARONE HCL 200 MG PO TABS: 200.0000 mg | ORAL_TABLET | Freq: Two times a day (BID) | ORAL | Status: DC

## 2017-03-23 MED ORDER — LABETALOL HCL 5 MG/ML IV SOLN
10.0000 mg | INTRAVENOUS | Status: DC
  Administered 2017-03-23 – 2017-03-24 (×3): 10 mg via INTRAVENOUS
  Filled 2017-03-23 (×3): qty 4

## 2017-03-23 MED ORDER — CLONIDINE HCL 0.2 MG/24HR TD PTWK
0.2000 mg | MEDICATED_PATCH | TRANSDERMAL | Status: DC
  Administered 2017-03-23: 0.2 mg via TRANSDERMAL
  Filled 2017-03-23: qty 1

## 2017-03-23 MED ORDER — HYDRALAZINE HCL 20 MG/ML IJ SOLN
10.0000 mg | Freq: Four times a day (QID) | INTRAMUSCULAR | Status: DC | PRN
  Administered 2017-03-23: 10 mg via INTRAVENOUS
  Filled 2017-03-23: qty 1

## 2017-03-23 MED ORDER — DEXTROSE-NACL 5-0.45 % IV SOLN
INTRAVENOUS | Status: DC
  Administered 2017-03-23 – 2017-03-24 (×2): via INTRAVENOUS

## 2017-03-23 MED ORDER — HYDRALAZINE HCL 20 MG/ML IJ SOLN
10.0000 mg | INTRAMUSCULAR | Status: DC
  Administered 2017-03-23 – 2017-03-24 (×4): 10 mg via INTRAVENOUS
  Filled 2017-03-23 (×5): qty 1

## 2017-03-23 MED ORDER — LABETALOL HCL 5 MG/ML IV SOLN
10.0000 mg | Freq: Once | INTRAVENOUS | Status: AC
  Administered 2017-03-23: 10 mg via INTRAVENOUS
  Filled 2017-03-23: qty 4

## 2017-03-23 NOTE — Progress Notes (Signed)
Subjective:   Patient presented from nursing home for uncontrollable shaking of his hands. In the emergency room, he was evaluated him. During hospitalization he also received multiple doses of baclofen. At present, he is very somnolent. All information is obtained from his wife. Nephrology consult has been requested for evaluation of acute renal failure Baseline creatinine appears to 2.1 from March 2018 with GFR of 26 In the hospital, and has progressively worsened and today's creatinine is 2.5 Patient is nonoliguric. He has a Foley catheter. He is getting IV fluids- D5 half normal saline at 60 cc per hour   Objective:  Vital signs in last 24 hours:  Temp:  [97.4 F (36.3 C)-98.9 F (37.2 C)] 98.8 F (37.1 C) (04/21 1307) Pulse Rate:  [65-74] 66 (04/21 1307) Resp:  [22-24] 24 (04/21 1307) BP: (139-207)/(68-83) 175/75 (04/21 1429) SpO2:  [94 %-98 %] 95 % (04/21 1307) Weight:  [78 kg (172 lb)] 78 kg (172 lb) (04/21 0535)  Weight change: 0.907 kg (2 lb) Filed Weights   03/21/17 0256 03/22/17 0404 03/23/17 0535  Weight: 76.2 kg (168 lb 1.6 oz) 77.1 kg (170 lb) 78 kg (172 lb)    Intake/Output:    Intake/Output Summary (Last 24 hours) at 03/23/17 1454 Last data filed at 03/23/17 1420  Gross per 24 hour  Intake              300 ml  Output             1600 ml  Net            -1300 ml     Physical Exam: General: Frail, elderly, laying in the bed   HEENT Oral mucous membranes appear dry , eyes closed   Neck No masses   Pulm/lungs tachypneic, no crackles are heard   CVS/Heart Irregular  rhythm  Abdomen:  Soft, nontender, nondistended, Foley catheter present   Extremities: No peripheral edema   Neurologic: Obtunded, not able to answer questions or follow commands   Skin: Decreased turgor           Basic Metabolic Panel:   Recent Labs Lab 03/20/17 0445 03/20/17 0748 03/21/17 0631 03/23/17 0738  NA 138  --  140 146*  K 4.6  --  3.5 3.5  CL 104  --  107 108  CO2  27  --  27 24  GLUCOSE 132*  --  167* 143*  BUN 38*  --  39* 45*  CREATININE 2.43*  --  2.25* 2.49*  CALCIUM 8.2*  --  8.1* 8.5*  MG  --  2.5*  --   --      CBC:  Recent Labs Lab 03/20/17 0445 03/21/17 0631 03/23/17 0738  WBC 6.2 6.2 10.1  NEUTROABS 4.5  --   --   HGB 11.2* 10.5* 11.7*  HCT 34.9* 32.3* 36.4*  MCV 84.7 85.8 84.6  PLT 302 251 314      Microbiology:  Recent Results (from the past 720 hour(s))  Urine culture     Status: None   Collection Time: 02/26/17  3:57 PM  Result Value Ref Range Status   Specimen Description URINE, RANDOM  Final   Special Requests NONE  Final   Culture   Final    NO GROWTH Performed at [facility], [address], [place], [place] [address]    Report Status 02/28/2017 FINAL  Final  Urine culture     Status: Abnormal   Collection Time: 03/20/17  5:45 AM  Result Value Ref Range Status   Specimen Description URINE, RANDOM  Final   Special Requests NONE  Final   Culture >=100,000 COLONIES/mL STAPHYLOCOCCUS EPIDERMIDIS (A)  Final   Report Status 03/22/2017 FINAL  Final   Organism ID, Bacteria STAPHYLOCOCCUS EPIDERMIDIS (A)  Final      Susceptibility   Staphylococcus epidermidis - MIC*    CIPROFLOXACIN >=8 RESISTANT Resistant     GENTAMICIN <=0.5 SENSITIVE Sensitive     NITROFURANTOIN <=16 SENSITIVE Sensitive     OXACILLIN >=4 RESISTANT Resistant     TETRACYCLINE <=1 SENSITIVE Sensitive     VANCOMYCIN 2 SENSITIVE Sensitive     TRIMETH/SULFA >=320 RESISTANT Resistant     CLINDAMYCIN <=0.25 SENSITIVE Sensitive     RIFAMPIN <=0.5 SENSITIVE Sensitive     Inducible Clindamycin NEGATIVE Sensitive     * >=100,000 COLONIES/mL STAPHYLOCOCCUS EPIDERMIDIS    Coagulation Studies: No results for input(s): LABPROT, INR in the last 72 hours.  Urinalysis: No results for input(s): COLORURINE, LABSPEC, PHURINE, GLUCOSEU, HGBUR, BILIRUBINUR, KETONESUR, PROTEINUR, UROBILINOGEN, NITRITE, LEUKOCYTESUR in the last 72 hours.  Invalid  input(s): APPERANCEUR    Imaging: Mr Brain Wo Contrast  Result Date: 03/22/2017 CLINICAL DATA:  83 year old male with altered mental status and stroke-like symptoms 2 nights ago, including unexplained tremor or shaking. EXAM: MRI HEAD WITHOUT CONTRAST TECHNIQUE: Multiplanar, multiecho pulse sequences of the brain and surrounding structures were obtained without intravenous contrast. COMPARISON:  Cervical spine MRI 03/20/2017.  Brain MRI 04/27/2011. FINDINGS: Brain: No restricted diffusion to suggest acute infarction. No midline shift, mass effect, evidence of mass lesion, ventriculomegaly, extra-axial collection or acute intracranial hemorrhage. Cervicomedullary junction and pituitary are within normal limits. Mild further generalized cerebral volume loss since 2012. Cerebral volume is at the lower limits of normal for age. Pearline Cables and white matter signal is stable and largely normal for age throughout the brain. Minimal to mild nonspecific white matter and deep gray matter nuclei T2 and FLAIR heterogeneity. No cortical encephalomalacia or discrete prior infarct identified. Vascular: Major intracranial vascular flow voids are stable since 2014 and within normal limits. Skull and upper cervical spine: Stable from recent cervical spine MRI. Sinuses/Orbits: Stable since 2012. Negative orbits soft tissues. Combined mucosal thickening and inspissated material in the left maxillary sinus. Other paranasal sinuses are well pneumatized. Other: Mastoids are clear. Visible internal auditory structures appear normal. Negative scalp soft tissues. IMPRESSION: 1. No acute intracranial abnormality, and largely unremarkable for age noncontrast MRI appearance of the brain. 2. Chronic left maxillary sinusitis. Mildly improved paranasal sinus aeration since 2012. Electronically Signed   By: Genevie Ann M.D.   On: 03/22/2017 13:02     Medications:   . cefTRIAXone (ROCEPHIN) IVPB 1 gram/50 mL D5W Stopped (03/23/17 1038)  . dextrose  5 % and 0.45% NaCl 60 mL/hr at 03/23/17 1249   . allopurinol  100 mg Oral Daily  . amLODipine  5 mg Oral Daily  . apixaban  2.5 mg Oral BID  . aspirin EC  81 mg Oral Daily  . cloNIDine  0.2 mg Transdermal Q Sat  . [START ON 04/15/2017] cyanocobalamin  1,000 mcg Intramuscular Q30 days  . docusate sodium  100 mg Oral BID  . DULoxetine  30 mg Oral Daily  . finasteride  5 mg Oral Daily  . loratadine  10 mg Oral Daily  . pantoprazole  40 mg Oral Daily  . potassium chloride  10 mEq Oral Daily  . primidone  50 mg Oral BID  .  sodium chloride flush  3 mL Intravenous Q12H  . tamsulosin  0.4 mg Oral Daily   acetaminophen **OR** acetaminophen, hydrALAZINE, ondansetron  Assessment/ Plan:  83 y.o. male with history of bladder cancer, atrial fibrillation, diabetes, gout, hypertension, hyperlipidemia, mitral valves prolapse, osteoarthritis, peripheral neuropathy, severe Peripheral vascular disease, chronic kidney disease followed by Dr. [facility] Course is complicated by altered mental status and somnolence  1. Acute renal failure on chronic kidney disease stage III Patient is nonoliguric. Baseline creatinine 2.1. Today's creatinine is 2.5. It is highly unlikely that renal failure is contributing to his altered mental status  2. Hypertension Blood pressure is running high, patient is not able to take oral medications Will add scheduled labetalol and IV hydralazine  3. Altered mental status Valium and baclofen have been discontinued. Consider discontinuing all other nonessential medications that can affect mental status   Discussed with patient's wife    LOS: 3 Keagon Glascoe 4/21/20182:54 PM

## 2017-03-23 NOTE — Progress Notes (Signed)
The RN voiced concern to Dr. Jannifer Franklin if patient could have  Dextrose IV since he's not responding to take oral food. Dr. Jannifer Franklin indicated blood sugar was good and will be looked into it today. Will continue to monitor.

## 2017-03-23 NOTE — Progress Notes (Signed)
SENT DR. KALISETTI TEXT PAGE TO MAKE AWARE SBP REMAINS HIGH 196. PRN HYDRALAZINE DID NOT HELP

## 2017-03-23 NOTE — Consult Note (Signed)
Reason for Consult:AMS Referring Physician: Dr. Tressia Miners   CC: confusion/AMS  HPI: Dean Garcia is an 83 y.o. 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 As per wife pt lives in a NH facility sometimes ambulated with walker and other times sits in wheelchair.  Pt was able to converse with wife while shaking.     Past Medical History:  Diagnosis Date  . Atrial fibrillation ([place])   . Bladder cancer ([place])   . Bundle branch block, right   . Carotid stenosis   . Colon polyp   . CVA (cerebral infarction)   . Diabetes ([place])   . Gout   . High cholesterol   . Hypertension   . Mitral valve prolapse   . Prostate cancer ([place])   . Tremor     Past Surgical History:  Procedure Laterality Date  . APPENDECTOMY    . CAROTID ARTERY ANGIOPLASTY    . CHOLECYSTECTOMY      Family History  Problem Relation Age of Onset  . Hypertension Father   . Stroke Father   . Diabetes Mother   . Prostate cancer Neg Hx   . Kidney cancer Neg Hx   . Kidney disease Neg Hx   . Bladder Cancer Neg Hx     Social History:  reports that he quit smoking about 6 years ago. His smoking use included Pipe. He has never used smokeless tobacco. He reports that he drinks alcohol. He reports that he does not use drugs.  Allergies  Allergen Reactions  . Demerol [Meperidine] Nausea And Vomiting and Nausea Only    Other reaction(s): Nausea And Vomiting, Vomiting Nausea/vomiting    Medications: I have reviewed the patient's current medications.  ROS: Unable to obtain as not following commands.    Physical Examination: Blood pressure (!) 194/68, pulse 74,  temperature 98.9 F (37.2 C), temperature source Oral, resp. rate (!) 24, height 6\' 1"  (1.854 m), weight 78 kg (172 lb), SpO2 94 %.  Pt is not following commands Occasionally moving all his extremities but not purposeful Does withdraw from painful stimuli Moans to pain, but no verbalization.    Laboratory Studies:   Basic Metabolic Panel:  Recent Labs Lab 03/20/17 0445 03/20/17 0748 03/21/17 0631 03/23/17 0738  NA 138  --  140 146*  K 4.6  --  3.5 3.5  CL 104  --  107 108  CO2 27  --  27 24  GLUCOSE 132*  --  167* 143*  BUN 38*  --  39* 45*  CREATININE 2.43*  --  2.25* 2.49*  CALCIUM 8.2*  --  8.1* 8.5*  MG  --  2.5*  --   --     Liver Function Tests:  Recent Labs Lab 03/23/17 0738  AST 30  ALT 12*  ALKPHOS 68  BILITOT 0.9  PROT 6.6  ALBUMIN 3.2*   No results for input(s): LIPASE, AMYLASE in the last 168 hours.  Recent Labs Lab 03/22/17 1133  AMMONIA 14    CBC:  Recent Labs Lab 03/20/17 0445 03/21/17 0631 03/23/17 0738  WBC 6.2 6.2 10.1  NEUTROABS 4.5  --   --   HGB 11.2* 10.5* 11.7*  HCT 34.9* 32.3* 36.4*  MCV 84.7 85.8 84.6  PLT 302  251 314    Cardiac Enzymes:  Recent Labs Lab 03/20/17 0445 03/20/17 0748 03/21/17 0048 03/21/17 0631 03/21/17 1227  TROPONINI 0.04* 0.06* <0.03 <0.03 0.03*    BNP: Invalid input(s): POCBNP  CBG:  Recent Labs Lab 03/21/17 0300 03/21/17 0748 03/22/17 0809 03/23/17 0805  GLUCAP 118* 140* 144* 139*    Microbiology: Results for orders placed or performed during the hospital encounter of 03/20/17  Urine culture     Status: Abnormal   Collection Time: 03/20/17  5:45 AM  Result Value Ref Range Status   Specimen Description URINE, RANDOM  Final   Special Requests NONE  Final   Culture >=100,000 COLONIES/mL STAPHYLOCOCCUS EPIDERMIDIS (A)  Final   Report Status 03/22/2017 FINAL  Final   Organism ID, Bacteria STAPHYLOCOCCUS EPIDERMIDIS (A)  Final      Susceptibility   Staphylococcus epidermidis -  MIC*    CIPROFLOXACIN >=8 RESISTANT Resistant     GENTAMICIN <=0.5 SENSITIVE Sensitive     NITROFURANTOIN <=16 SENSITIVE Sensitive     OXACILLIN >=4 RESISTANT Resistant     TETRACYCLINE <=1 SENSITIVE Sensitive     VANCOMYCIN 2 SENSITIVE Sensitive     TRIMETH/SULFA >=320 RESISTANT Resistant     CLINDAMYCIN <=0.25 SENSITIVE Sensitive     RIFAMPIN <=0.5 SENSITIVE Sensitive     Inducible Clindamycin NEGATIVE Sensitive     * >=100,000 COLONIES/mL STAPHYLOCOCCUS EPIDERMIDIS    Coagulation Studies: No results for input(s): LABPROT, INR in the last 72 hours.  Urinalysis:  Recent Labs Lab 03/20/17 0445  COLORURINE YELLOW*  LABSPEC 1.010  PHURINE 8.0  GLUCOSEU NEGATIVE  HGBUR SMALL*  BILIRUBINUR NEGATIVE  KETONESUR NEGATIVE  PROTEINUR 30*  NITRITE NEGATIVE  LEUKOCYTESUR LARGE*    Lipid Panel:  No results found for: CHOL, TRIG, HDL, CHOLHDL, VLDL, LDLCALC  HgbA1C:  Lab Results  Component Value Date   HGBA1C 6.7 (H) 02/27/2013    Urine Drug Screen:  No results found for: LABOPIA, COCAINSCRNUR, LABBENZ, AMPHETMU, THCU, LABBARB  Alcohol Level: No results for input(s): ETH in the last 168 hours.  Other results:  Imaging: Mr Brain Wo Contrast  Result Date: 03/22/2017 CLINICAL DATA:  83 year old male with altered mental status and stroke-like symptoms 2 nights ago, including unexplained tremor or shaking. EXAM: MRI HEAD WITHOUT CONTRAST TECHNIQUE: Multiplanar, multiecho pulse sequences of the brain and surrounding structures were obtained without intravenous contrast. COMPARISON:  Cervical spine MRI 03/20/2017.  Brain MRI 04/27/2011. FINDINGS: Brain: No restricted diffusion to suggest acute infarction. No midline shift, mass effect, evidence of mass lesion, ventriculomegaly, extra-axial collection or acute intracranial hemorrhage. Cervicomedullary junction and pituitary are within normal limits. Mild further generalized cerebral volume loss since 2012. Cerebral volume is at the lower  limits of normal for age. Pearline Cables and white matter signal is stable and largely normal for age throughout the brain. Minimal to mild nonspecific white matter and deep gray matter nuclei T2 and FLAIR heterogeneity. No cortical encephalomalacia or discrete prior infarct identified. Vascular: Major intracranial vascular flow voids are stable since 2014 and within normal limits. Skull and upper cervical spine: Stable from recent cervical spine MRI. Sinuses/Orbits: Stable since 2012. Negative orbits soft tissues. Combined mucosal thickening and inspissated material in the left maxillary sinus. Other paranasal sinuses are well pneumatized. Other: Mastoids are clear. Visible internal auditory structures appear normal. Negative scalp soft tissues. IMPRESSION: 1. No acute intracranial abnormality, and largely unremarkable for age noncontrast MRI appearance of the brain. 2. Chronic left maxillary sinusitis. Mildly improved paranasal sinus aeration since  2012. Electronically Signed   By: Genevie Ann M.D.   On: 03/22/2017 13:02     Assessment/Plan:  83 y.o. 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 As per wife pt lives in a NH facility sometimes ambulated with walker and other times sits in wheelchair.  Pt was able to converse with wife while shaking.    - Likely combination of metabolic encephalopathy and hypoactive delirium secondary due to UTI - MRI reviewed no acute abnormalities - do not think seizure activity - wife is considering hospice which might be appropriate given the fact that pt lived in [facility] facility and needing assistance  - would hold of further  imaging at this point and observe to see if delirium/encephalopathy improves.   Leotis Pain  03/23/2017, 12:11 PM

## 2017-03-23 NOTE — Progress Notes (Signed)
[place] [facility] [place] NAME: Dean Garcia    MR#:  [id number]  DATE OF BIRTH:  1928-05-23  SUBJECTIVE:  CHIEF COMPLAINT:   Chief Complaint  Patient presents with  . Shaking   - not much change in mental status. Remains lethargic  REVIEW OF SYSTEMS:  Review of Systems  Unable to perform ROS: Mental status change    DRUG ALLERGIES:   Allergies  Allergen Reactions  . Demerol [Meperidine] Nausea And Vomiting and Nausea Only    Other reaction(s): Nausea And Vomiting, Vomiting Nausea/vomiting    VITALS:  Blood pressure (!) 196/76, pulse 66, temperature 98.8 F (37.1 C), temperature source Oral, resp. rate (!) 24, height 6\' 1"  (1.854 m), weight 78 kg (172 lb), SpO2 95 %.  PHYSICAL EXAMINATION:  Physical Exam  GENERAL:  83 y.o.-year-old patient lying in the bed, mouth breathing and appearing sick.  EYES: Pupils equal, round, reactive to light and accommodation. No scleral icterus. Extraocular muscles intact.  HEENT: Head atraumatic, normocephalic. Oropharynx and nasopharynx clear.  NECK:  Supple, no jugular venous distention. No thyroid enlargement, no tenderness.  LUNGS: Normal breath sounds bilaterally, no wheezing, rales,rhonchi or crepitation. No use of accessory muscles of respiration.  CARDIOVASCULAR: S1, S2 normal. No rubs, or gallops. 3/6 systolic murmur present ABDOMEN: Soft, nontender, nondistended. Bowel sounds present. No organomegaly or mass.  EXTREMITIES: No pedal edema, cyanosis, or clubbing.  NEUROLOGIC: opening eyes, but not following commands, non verbal PSYCHIATRIC: The patient is alert. But nonverbal  SKIN: No obvious rash, lesion, or ulcer.    LABORATORY PANEL:   CBC  Recent Labs Lab 03/23/17 0738  WBC 10.1  HGB 11.7*  HCT 36.4*  PLT 314   ------------------------------------------------------------------------------------------------------------------  Chemistries   Recent Labs Lab 03/20/17 0748   03/23/17 0738  NA  --   < > 146*  K  --   < > 3.5  CL  --   < > 108  CO2  --   < > 24  GLUCOSE  --   < > 143*  BUN  --   < > 45*  CREATININE  --   < > 2.49*  CALCIUM  --   < > 8.5*  MG 2.5*  --   --   AST  --   --  30  ALT  --   --  12*  ALKPHOS  --   --  68  BILITOT  --   --  0.9  < > = values in this interval not displayed. ------------------------------------------------------------------------------------------------------------------  Cardiac Enzymes  Recent Labs Lab 03/21/17 1227  TROPONINI 0.03*   ------------------------------------------------------------------------------------------------------------------  RADIOLOGY:  Mr Brain Wo Contrast  Result Date: 03/22/2017 CLINICAL DATA:  83 year old male with altered mental status and stroke-like symptoms 2 nights ago, including unexplained tremor or shaking. EXAM: MRI HEAD WITHOUT CONTRAST TECHNIQUE: Multiplanar, multiecho pulse sequences of the brain and surrounding structures were obtained without intravenous contrast. COMPARISON:  Cervical spine MRI 03/20/2017.  Brain MRI 04/27/2011. FINDINGS: Brain: No restricted diffusion to suggest acute infarction. No midline shift, mass effect, evidence of mass lesion, ventriculomegaly, extra-axial collection or acute intracranial hemorrhage. Cervicomedullary junction and pituitary are within normal limits. Mild further generalized cerebral volume loss since 2012. Cerebral volume is at the lower limits of normal for age. Pearline Cables and white matter signal is stable and largely normal for age throughout the brain. Minimal to mild nonspecific white matter and deep gray matter nuclei T2 and FLAIR heterogeneity.  No cortical encephalomalacia or discrete prior infarct identified. Vascular: Major intracranial vascular flow voids are stable since 2014 and within normal limits. Skull and upper cervical spine: Stable from recent cervical spine MRI. Sinuses/Orbits: Stable since 2012. Negative orbits soft  tissues. Combined mucosal thickening and inspissated material in the left maxillary sinus. Other paranasal sinuses are well pneumatized. Other: Mastoids are clear. Visible internal auditory structures appear normal. Negative scalp soft tissues. IMPRESSION: 1. No acute intracranial abnormality, and largely unremarkable for age noncontrast MRI appearance of the brain. 2. Chronic left maxillary sinusitis. Mildly improved paranasal sinus aeration since 2012. Electronically Signed   By: Genevie Ann M.D.   On: 03/22/2017 13:02    EKG:   Orders placed or performed during the hospital encounter of 03/20/17  . ED EKG  . ED EKG  . EKG 12-Lead  . EKG 12-Lead  . EKG 12-Lead  . EKG 12-Lead  . EKG 12-Lead  . EKG 12-Lead    ASSESSMENT AND PLAN:   83 year old male with past medical history significant for hypertension, hyperlipidemia, chronic atrial fibrillation, CK D admitted to hospital from [facility] long-term care due to worsening muscle spasms and altered mental status.  #1 AMS- metabolic encephalopathy. Acute cystitis - negative urine cultures, but blood cultures growing staph epidermidis - on rocephin - MRI brain with no acute findings and neuro consult appreciated, ammonia within normal limits - DC baclofen and any other sedatives. Patient anyway not taking oral meds  #2 Restless leg syndrome- follows with neurology as outpatient - appreciate vascular input as well - MRI thoracic and lumbar spine without any significant findings other than DJD - baclofen held as not responding  #3 Depression-  Prior h/o suicide attempt. Hold remeron as lethargic  #4 Chronic afib- rate controlled, on eliquis  #5 ARF on CKD- not eating and drinking, gentle hydration, wife requested nephrology consult  #6 DVT prophylaxis- eliquis  Palliative care consulted due to poor prognosis Discussed with wife at bedside extensively about possible hospice if no improvement noted. Wants to talk to his chlidren and get  back tomorrow    All the records are reviewed and case discussed with Care Management/Social Workerr. Management plans discussed with the patient, family and they are in agreement.  CODE STATUS:  DNR  TOTAL TIME TAKING CARE OF THIS PATIENT: 38 minutes.   POSSIBLE D/C IN 2 DAYS, DEPENDING ON CLINICAL CONDITION.   Gladstone Lighter M.D on 03/23/2017 at 1:09 PM  Between 7am to 6pm - Pager - [phone]  After 6pm go to [email or web address] - password [email or web address]  Office  [phone]  CC: Primary care physician; Kirk Ruths., MD

## 2017-03-24 LAB — BASIC METABOLIC PANEL
Anion gap: 10 (ref 5–15)
BUN: 59 mg/dL — AB (ref 6–20)
CO2: 26 mmol/L (ref 22–32)
CREATININE: 2.5 mg/dL — AB (ref 0.61–1.24)
Calcium: 8 mg/dL — ABNORMAL LOW (ref 8.9–10.3)
Chloride: 109 mmol/L (ref 101–111)
GFR calc Af Amer: 25 mL/min — ABNORMAL LOW (ref >60)
GFR calc non Af Amer: 22 mL/min — ABNORMAL LOW (ref >60)
GLUCOSE: 209 mg/dL — AB (ref 65–99)
Potassium: 4.1 mmol/L (ref 3.5–5.1)
SODIUM: 145 mmol/L (ref 135–145)

## 2017-03-24 LAB — GLUCOSE, CAPILLARY: GLUCOSE-CAPILLARY: 199 mg/dL — AB (ref 65–99)

## 2017-03-24 MED ORDER — LORAZEPAM 2 MG/ML IJ SOLN
1.0000 mg | INTRAMUSCULAR | Status: DC | PRN
  Administered 2017-03-24 – 2017-03-25 (×3): 1 mg via INTRAVENOUS
  Filled 2017-03-24 (×3): qty 1

## 2017-03-24 MED ORDER — MORPHINE SULFATE (CONCENTRATE) 10 MG/0.5ML PO SOLN
10.0000 mg | ORAL | Status: DC | PRN
  Administered 2017-03-24: 12:00:00 10 mg via ORAL
  Filled 2017-03-24: qty 1

## 2017-03-24 MED ORDER — MORPHINE SULFATE (PF) 2 MG/ML IV SOLN
2.0000 mg | INTRAVENOUS | Status: DC | PRN
  Administered 2017-03-24: 3 mg via INTRAVENOUS
  Administered 2017-03-25: 10:00:00 2 mg via INTRAVENOUS
  Filled 2017-03-24: qty 1
  Filled 2017-03-24: qty 2

## 2017-03-24 MED ORDER — MORPHINE SULFATE (CONCENTRATE) 10 MG/0.5ML PO SOLN
10.0000 mg | ORAL | Status: DC | PRN
  Administered 2017-03-25: 12:00:00 10 mg via ORAL
  Filled 2017-03-24: qty 1

## 2017-03-24 MED ORDER — ACETAMINOPHEN 650 MG RE SUPP: 650.0000 mg | RECTAL | Status: DC | PRN

## 2017-03-24 NOTE — Progress Notes (Signed)
Patient transferred to oncology per MD order. Report called to [facility] RN.

## 2017-03-24 NOTE — Progress Notes (Signed)
The NT called the RN to patient's bedside because she noted his Foley lying beside his bedside. The RN went and observed small amount of bright red blood on his pad. No s/s of pain  noted. Dr. Marcille Blanco nofiied and he indicated to leave the Foley out for now due to the  trauma. Patient has been brady most part of the shift and is still brady. New order to hold the Labetelol 10 mg IV. Will continue to monitor.

## 2017-03-24 NOTE — Progress Notes (Signed)
   [place] at [facility] Day: 4 days Dean Garcia is a 83 y.o. male presenting with Shaking .   Advance care planning discussed with patient's wife at bedside as patient was unresponsive. All questions in regards to overall condition and expected prognosis answered. The decision was made to continue current code status of DNR But change to comfort care measures.  CODE STATUS: DNR Time spent: 18 minutes

## 2017-03-24 NOTE — Progress Notes (Signed)
Weight not done this morning due  bed malfunction.

## 2017-03-24 NOTE — Clinical Social Work Note (Signed)
CSW saw via chart review that patient is now comfort care. CSW will contact [facility] and discuss possible weekend referral. If no beds available, CSW will refer to the hospice liaison to assess on 4/23.  Santiago Bumpers, MSW, Latanya Presser [phone]

## 2017-03-24 NOTE — Progress Notes (Signed)
[facility] [place] NAME: Dean Garcia    MR#:  [id number]  DATE OF BIRTH:  1928-03-27  SUBJECTIVE:  CHIEF COMPLAINT:   Chief Complaint  Patient presents with  . Shaking   - opening eyes to deep stimulation today - but his respiratory rate rapidly increases as he wakes up. Not following commands  REVIEW OF SYSTEMS:  Review of Systems  Unable to perform ROS: Mental status change    DRUG ALLERGIES:   Allergies  Allergen Reactions  . Demerol [Meperidine] Nausea And Vomiting and Nausea Only    Other reaction(s): Nausea And Vomiting, Vomiting Nausea/vomiting    VITALS:  Blood pressure (!) 164/63, pulse 64, temperature 97.7 F (36.5 C), temperature source Oral, resp. rate (!) 24, height 6\' 1"  (1.854 m), weight 78 kg (172 lb), SpO2 93 %.  PHYSICAL EXAMINATION:  Physical Exam  GENERAL:  83 y.o.-year-old patient lying in the bed, mouth breathing and appearing sick.  EYES: Pupils equal, round, reactive to light and accommodation. No scleral icterus. Extraocular muscles intact.  HEENT: Head atraumatic, normocephalic. Oropharynx and nasopharynx clear.  NECK:  Supple, no jugular venous distention. No thyroid enlargement, no tenderness.  LUNGS: Normal breath sounds bilaterally, no wheezing, rales,rhonchi or crepitation. No use of accessory muscles of respiration.  CARDIOVASCULAR: S1, S2 normal. No rubs, or gallops. 3/6 systolic murmur present ABDOMEN: Soft, nontender, nondistended. Bowel sounds present. No organomegaly or mass.  EXTREMITIES: No pedal edema, cyanosis, or clubbing.  NEUROLOGIC: opening eyes to stimulation and gaze is fixed today, but not following commands, non verbal PSYCHIATRIC: The patient is alert. But nonverbal  SKIN: No obvious rash, lesion, or ulcer.    LABORATORY PANEL:   CBC  Recent Labs Lab 03/23/17 0738  WBC 10.1  HGB 11.7*  HCT 36.4*  PLT 314    ------------------------------------------------------------------------------------------------------------------  Chemistries   Recent Labs Lab 03/20/17 0748  03/23/17 0738 03/24/17 0752  NA  --   < > 146* 145  K  --   < > 3.5 4.1  CL  --   < > 108 109  CO2  --   < > 24 26  GLUCOSE  --   < > 143* 209*  BUN  --   < > 45* 59*  CREATININE  --   < > 2.49* 2.50*  CALCIUM  --   < > 8.5* 8.0*  MG 2.5*  --   --   --   AST  --   --  30  --   ALT  --   --  12*  --   ALKPHOS  --   --  68  --   BILITOT  --   --  0.9  --   < > = values in this interval not displayed. ------------------------------------------------------------------------------------------------------------------  Cardiac Enzymes  Recent Labs Lab 03/21/17 1227  TROPONINI 0.03*   ------------------------------------------------------------------------------------------------------------------  RADIOLOGY:  Mr Brain Wo Contrast  Result Date: 03/22/2017 CLINICAL DATA:  83 year old male with altered mental status and stroke-like symptoms 2 nights ago, including unexplained tremor or shaking. EXAM: MRI HEAD WITHOUT CONTRAST TECHNIQUE: Multiplanar, multiecho pulse sequences of the brain and surrounding structures were obtained without intravenous contrast. COMPARISON:  Cervical spine MRI 03/20/2017.  Brain MRI 04/27/2011. FINDINGS: Brain: No restricted diffusion to suggest acute infarction. No midline shift, mass effect, evidence of mass lesion, ventriculomegaly, extra-axial collection or acute intracranial hemorrhage. Cervicomedullary junction and pituitary are within normal limits. Mild further generalized cerebral volume  loss since 2012. Cerebral volume is at the lower limits of normal for age. Pearline Cables and white matter signal is stable and largely normal for age throughout the brain. Minimal to mild nonspecific white matter and deep gray matter nuclei T2 and FLAIR heterogeneity. No cortical encephalomalacia or discrete prior  infarct identified. Vascular: Major intracranial vascular flow voids are stable since 2014 and within normal limits. Skull and upper cervical spine: Stable from recent cervical spine MRI. Sinuses/Orbits: Stable since 2012. Negative orbits soft tissues. Combined mucosal thickening and inspissated material in the left maxillary sinus. Other paranasal sinuses are well pneumatized. Other: Mastoids are clear. Visible internal auditory structures appear normal. Negative scalp soft tissues. IMPRESSION: 1. No acute intracranial abnormality, and largely unremarkable for age noncontrast MRI appearance of the brain. 2. Chronic left maxillary sinusitis. Mildly improved paranasal sinus aeration since 2012. Electronically Signed   By: Genevie Ann M.D.   On: 03/22/2017 13:02    EKG:   Orders placed or performed during the hospital encounter of 03/20/17  . ED EKG  . ED EKG  . EKG 12-Lead  . EKG 12-Lead  . EKG 12-Lead  . EKG 12-Lead  . EKG 12-Lead  . EKG 12-Lead    ASSESSMENT AND PLAN:   83 year old male with past medical history significant for hypertension, hyperlipidemia, chronic atrial fibrillation, CK D admitted to hospital from [facility] long-term care due to worsening muscle spasms and altered mental status.  #1 AMS- metabolic encephalopathy. Acute cystitis  - negative urine cultures, but blood cultures growing staph epidermidis - on rocephin - MRI brain with no acute findings and neuro consult appreciated, ammonia within normal limits - DC baclofen and any other sedatives. Patient anyway not taking oral meds - consider  EEG tomorrow if still remains like this  #2 Restless leg syndrome- follows with neurology as outpatient - appreciate vascular input as well - MRI thoracic and lumbar spine without any significant findings other than DJD - baclofen held as not responding- currently no involuntary myoclonic jerks noted.  #3 Depression-  Prior h/o suicide attempt. Hold remeron as lethargic  #4  Chronic afib- rate controlled, on eliquis  #5 ARF on CKD- not eating and drinking, gentle hydration, nephrology consult Baseline cr at 2.1. IV fluids started Hold nephrotixins  #6 DVT prophylaxis- eliquis  Palliative care consulted due to poor prognosis    All the records are reviewed and case discussed with Care Management/Social Workerr. Management plans discussed with the patient, family and they are in agreement.  CODE STATUS:  DNR  TOTAL TIME TAKING CARE OF THIS PATIENT: 38 minutes.   POSSIBLE D/C IN 1-2 DAYS, DEPENDING ON CLINICAL CONDITION.   Gladstone Lighter M.D on 03/24/2017 at 8:33 AM  Between 7am to 6pm - Pager - [phone]  After 6pm go to [email or web address] - password [facility]  Office  [phone]  CC: Primary care physician; Kirk Ruths., MD

## 2017-03-24 NOTE — Progress Notes (Signed)
   Discussed with his wife at bedside, progressive decline no improvement in clinical status. [name] had a chance to discuss with patient's children in the home agreed for comfort care and hospice at this time. -Discontinue the treatment, blood draws and telemetric. Transfer to oncology floor for Comfort Care.  Additional time spent: 35 min

## 2017-03-24 NOTE — Progress Notes (Signed)
       [name]  SUBJECTIVE: Minimally responsive   Vitals:   03/23/17 2005 03/24/17 0015 03/24/17 0421 03/24/17 0812  BP: (!) 191/62 (!) 154/61 (!) 185/63 (!) 164/63  Pulse: 94 (!) 57 (!) 53 64  Resp: (!) 21  (!) 24   Temp: 98.3 F (36.8 C)  98.8 F (37.1 C) 97.7 F (36.5 C)  TempSrc: Oral  Oral Oral  SpO2: 95% 95% 96% 93%  Weight:      Height:        Intake/Output Summary (Last 24 hours) at 03/24/17 0920 Last data filed at 03/24/17 0700  Gross per 24 hour  Intake             1255 ml  Output              903 ml  Net              352 ml    LABS: Basic Metabolic Panel:  Recent Labs  03/23/17 0738 03/24/17 0752  NA 146* 145  K 3.5 4.1  CL 108 109  CO2 24 26  GLUCOSE 143* 209*  BUN 45* 59*  CREATININE 2.49* 2.50*  CALCIUM 8.5* 8.0*   Liver Function Tests:  Recent Labs  03/23/17 0738  AST 30  ALT 12*  ALKPHOS 68  BILITOT 0.9  PROT 6.6  ALBUMIN 3.2*   No results for input(s): LIPASE, AMYLASE in the last 72 hours. CBC:  Recent Labs  03/23/17 0738  WBC 10.1  HGB 11.7*  HCT 36.4*  MCV 84.6  PLT 314   Cardiac Enzymes:  Recent Labs  03/21/17 1227  TROPONINI 0.03*   BNP: Invalid input(s): POCBNP D-Dimer: No results for input(s): DDIMER in the last 72 hours. Hemoglobin A1C: No results for input(s): HGBA1C in the last 72 hours. Fasting Lipid Panel: No results for input(s): CHOL, HDL, LDLCALC, TRIG, CHOLHDL, LDLDIRECT in the last 72 hours. Thyroid Function Tests:  Recent Labs  03/22/17 1227  TSH 6.539*   Anemia Panel: No results for input(s): VITAMINB12, FOLATE, FERRITIN, TIBC, IRON, RETICCTPCT in the last 72 hours.   Physical Exam: Blood pressure (!) 164/63, pulse 64, temperature 97.7 F (36.5 C), temperature source Oral, resp. rate (!) 24, height 6\' 1"  (1.854 m), weight 78 kg (172 lb), SpO2 93 %.   Wt Readings from Last 1 Encounters:  03/23/17 78 kg (172 lb)     General  appearance: moderate distress and slowed mentation Resp: rhonchi bilaterally Cardio: regular rate and rhythm Neurologic: Mental status: alertness: obtunded  TELEMETRY: Reviewed telemetry pt in probable idioventricular  ASSESSMENT AND PLAN:  Spoke with patient's wife and observe the patient. Heart rhythm is likely idioventricular however rate appears adequate. He did have some bradycardia last p.m. Patient spell status is worsened. Patient's wife has discussed with their children regarding comfort measures and hospice care. I'm comfortable with this. Would recommend comfort care. Poor prognosis    Dean Spray, MD, [facility] 03/24/2017 9:20 AM

## 2017-03-25 MED ORDER — LORAZEPAM 1 MG PO TABS: 1.0000 mg | ORAL_TABLET | Freq: Three times a day (TID) | ORAL | 0 refills | Status: AC

## 2017-03-25 MED ORDER — ACETAMINOPHEN 650 MG RE SUPP: 650.0000 mg | RECTAL | 0 refills | Status: AC | PRN

## 2017-03-25 MED ORDER — MORPHINE SULFATE (CONCENTRATE) 10 MG/0.5ML PO SOLN: 10.0000 mg | ORAL | 0 refills | Status: AC | PRN

## 2017-03-25 NOTE — Progress Notes (Signed)
No charge note:  Note that patient has been transitioned to comfort measures over weekend and referral has been made for residential hospice. Palliative medicine will sign off. Spoke with [place], Hospice Liaison and she is following patient for transition to hospice home. Please reconsult if further assistance is needed.   Mariana Kaufman, AGNP-C Palliative Medicine  Please call Palliative Medicine team phone with any questions [phone]. For individual providers please see AMION.

## 2017-03-25 NOTE — Consult Note (Signed)
New referral for hospice home on 83 yo gentleman who was admitted to [facility] on 4.18.18 with altered mental status.  Patient with history of HTN, hyperlipidemia, chronic atrial fib, and CKD.  He has had progressive decline since December 2017 with multiple hospitalizations.  I met with his wife, Izora Gala to discuss hospice home criteria and hospice services.  Post discussion- she elects to take her husband back to [facility] with hospice care and wants to transition to full comfort measures- which he has been receiving at [facility].  Mr. Gassett is resting with his eyes close and does not respond to tactile or verbal stimulation.  Izora Gala states he will open his eyes some, but has been minimally responsive over past several days.  He is not eating or drinking anything.  I called and spoke with Seth Bake- admissions person at [facility], and she confirms they have a bed and are delighted to take Mr. Trinidad under full comfort with hospice.  Discussed the wife's wishes with the team at [facility] and they will initiate discharge back to [facility] today.  Patient is figitidy and asked Wyannette, RN to dose patient with ativan and haldol for comfort and signs of terminal restlessness.  Patient had morphine dose at 1007 this morning.  Suggested giving ativan with doses of morphine to help further with pain and restlessness.  Referral information faxed to referral intake. Thank you for allowing participation in this patient's care.    Dimas Aguas, RN [phone]

## 2017-03-25 NOTE — Progress Notes (Signed)
Pt left  via ems at this time for [facility].  Sedated. No resp distress.  Sl d/cd.  Report called to andra at [facility]

## 2017-03-25 NOTE — Care Management Important Message (Signed)
Important Message  Patient Details  Name: Roman Dubuc MRN: [id number] Date of Birth: 08-07-1928   Medicare Important Message Given:  Yes    Shelbie Ammons, RN 03/25/2017, 12:44 PM

## 2017-03-25 NOTE — Clinical Social Work Note (Signed)
Pt is ready for discharge today and will return to [facility]. Facility is ready to admit pt as they have received discharge information. Pt's wife is aware and agreeable to discharge plan. RN will call report. [facility] will provide transportation. CSW is signing off as no further needs identified.   Darden Dates, MSW, LCSW  Clinical Social Worker  [phone]

## 2017-03-25 NOTE — Discharge Summary (Signed)
[facility] [place] NAME: Dean Garcia    MR#:  [id number]  DATE OF BIRTH:  July 19, 1928  DATE OF ADMISSION:  03/20/2017   ADMITTING PHYSICIAN: Max Sane, MD  DATE OF DISCHARGE: 03/25/17  PRIMARY CARE PHYSICIAN: Kirk Ruths., MD   ADMISSION DIAGNOSIS:   Myelopathy ([place]) [G95.9] Coarse tremors [G25.2] Urinary tract infection without hematuria, site unspecified [N39.0]  DISCHARGE DIAGNOSIS:   Active Problems:   Elevated troponin   Sepsis ([place])   Bradycardia   Acute lower UTI   Cramping of feet   CKD (chronic kidney disease), stage IV (HCC)   Swelling of lower extremity   SECONDARY DIAGNOSIS:   Past Medical History:  Diagnosis Date  . Atrial fibrillation ([place])   . Bladder cancer ([place])   . Bundle branch block, right   . Carotid stenosis   . Colon polyp   . CVA (cerebral infarction)   . Diabetes ([place])   . Gout   . High cholesterol   . Hypertension   . Mitral valve prolapse   . Prostate cancer ([place])   . Tremor     HOSPITAL COURSE:   83 year old male with past medical history significant for hypertension, hyperlipidemia, chronic atrial fibrillation, CK D admitted to hospital from [facility] long-term care due to worsening muscle spasms and altered mental status.  Patient got admitted for acute metabolic encephalopathy secondary to cystitis. His urine cultures were negative but blood cultures growing staph epidermidis. -MRI of the brain did not show acute findings. Patient was on Rocephin initially. His other muscle relaxants and antidepressants were discontinued due to his lethargic mental status. Also seen and evaluated by neurology. Overall poor prognosis with prior decreased clinical condition. Discussion with wife at bedside who also discussed with his children, family has decided to pursue comfort measures and hospice. Patient was started on comfort care on 03/24/2017. He is still arousable to tactile  stimulation but nonverbal. Has apneic breath at times. Vitals are stable otherwise. Plan is to discharge back to [facility] with hospice services.  His other diagnosis are:  - Restless leg syndrome -Depression and anxiety -Chronic atrial fibrillation with bradycardic episodes -Acute renal failure -CK D stage III -Chronic neuropathy in feet pain -Diabetes mellitus  Patient will be discharged today with hospice services. Poor prognosis. Wife updated at bedside.   DISCHARGE CONDITIONS:   Critical  CONSULTS OBTAINED:   Treatment Team:  Katha Cabal, MD Corey Skains, MD Teodoro Spray, MD Alexis Goodell, MD Leotis Pain, MD  DRUG ALLERGIES:   Allergies  Allergen Reactions  . Demerol [Meperidine] Nausea And Vomiting and Nausea Only    Other reaction(s): Nausea And Vomiting, Vomiting Nausea/vomiting   DISCHARGE MEDICATIONS:   Allergies as of 03/25/2017      Reactions   Demerol [meperidine] Nausea And Vomiting, Nausea Only   Other reaction(s): Nausea And Vomiting, Vomiting Nausea/vomiting      Medication List    STOP taking these medications   ACCU-CHEK COMPACT PLUS test strip Generic drug:  glucose blood   allantoin gel   allopurinol 100 MG tablet Commonly known as:  ZYLOPRIM   amLODipine 5 MG tablet Commonly known as:  NORVASC   B-D ULTRAFINE III SHORT PEN 31G X 8 MM Misc Generic drug:  Insulin Pen Needle   colchicine 0.6 MG tablet   cyanocobalamin 1000 MCG/ML injection Commonly known as:  (VITAMIN B-12)   docusate sodium 100 MG capsule Commonly known as:  COLACE   DULoxetine 30 MG capsule Commonly known as:  CYMBALTA   ELIQUIS 2.5 MG Tabs tablet Generic drug:  apixaban   fexofenadine 180 MG tablet Commonly known as:  ALLEGRA   HYDROcodone-acetaminophen 10-325 MG tablet Commonly known as:  NORCO   mirtazapine 30 MG tablet Commonly known as:  REMERON   omeprazole 20 MG capsule Commonly known as:  PRILOSEC   ondansetron 4 MG  disintegrating tablet Commonly known as:  ZOFRAN-ODT   oxyCODONE-acetaminophen 7.5-325 MG tablet Commonly known as:  PERCOCET   potassium chloride 10 MEQ tablet Commonly known as:  K-DUR,KLOR-CON   primidone 50 MG tablet Commonly known as:  MYSOLINE   ranitidine 150 MG tablet Commonly known as:  ZANTAC   sitaGLIPtin 25 MG tablet Commonly known as:  JANUVIA   tamsulosin 0.4 MG Caps capsule Commonly known as:  FLOMAX   torsemide 20 MG tablet Commonly known as:  DEMADEX   traZODone 50 MG tablet Commonly known as:  DESYREL     TAKE these medications   acetaminophen 650 MG suppository Commonly known as:  TYLENOL Place 1 suppository (650 mg total) rectally every 4 (four) hours as needed for fever or mild pain.   LORazepam 1 MG tablet Commonly known as:  ATIVAN Take 1 tablet (1 mg total) by mouth every 8 (eight) hours.   morphine CONCENTRATE 10 MG/0.5ML Soln concentrated solution Take 0.5 mLs (10 mg total) by mouth every hour as needed for moderate pain, severe pain or shortness of breath.        DISCHARGE INSTRUCTIONS:   1. Hospice services to follow up  DIET:   Regular diet  ACTIVITY:   Bedrest  OXYGEN:   Home Oxygen: No.  Oxygen Delivery: room air  DISCHARGE LOCATION:   nursing home   If you experience worsening of your admission symptoms, develop shortness of breath, life threatening emergency, suicidal or homicidal thoughts you must seek medical attention immediately by calling 911 or calling your MD immediately  if symptoms less severe.  You Must read complete instructions/literature along with all the possible adverse reactions/side effects for all the Medicines you take and that have been prescribed to you. Take any new Medicines after you have completely understood and accpet all the possible adverse reactions/side effects.   Please note  You were cared for by a hospitalist during your hospital stay. If you have any questions about your  discharge medications or the care you received while you were in the hospital after you are discharged, you can call the unit and asked to speak with the hospitalist on call if the hospitalist that took care of you is not available. Once you are discharged, your primary care physician will handle any further medical issues. Please note that NO REFILLS for any discharge medications will be authorized once you are discharged, as it is imperative that you return to your primary care physician (or establish a relationship with a primary care physician if you do not have one) for your aftercare needs so that they can reassess your need for medications and monitor your lab values.    On the day of Discharge:  VITAL SIGNS:   Blood pressure (!) 165/47, pulse (!) 54, temperature 97.6 F (36.4 C), temperature source Oral, resp. rate 20, height 6\' 1"  (1.854 m), weight 78 kg (172 lb), SpO2 93 %.  PHYSICAL EXAMINATION:    GENERAL:  83 y.o.-year-old patient lying in the bed, mouth breathing and appearing sick.  EYES: Pupils equal, round,  reactive to light and accommodation. No scleral icterus. Extraocular muscles intact.  HEENT: Head atraumatic, normocephalic. Oropharynx and nasopharynx clear.  NECK:  Supple, no jugular venous distention. No thyroid enlargement, no tenderness.  LUNGS: Normal breath sounds bilaterally, no wheezing, rales,rhonchi or crepitation. No use of accessory muscles of respiration. Shallow respirations, apneic at times CARDIOVASCULAR: S1, S2 normal. No rubs, or gallops. 3/6 systolic murmur present ABDOMEN: Soft, nontender, nondistended. Bowel sounds present. No organomegaly or mass.  EXTREMITIES: No pedal edema, cyanosis, or clubbing.  NEUROLOGIC: opening eyes to stimulation and gaze is fixed today, but not following commands, non verbal PSYCHIATRIC: The patient is sleeping and remains nonverbal  SKIN: No obvious rash, lesion, or ulcer.   DATA REVIEW:   CBC  Recent Labs Lab  03/23/17 0738  WBC 10.1  HGB 11.7*  HCT 36.4*  PLT 314    Chemistries   Recent Labs Lab 03/20/17 0748  03/23/17 0738 03/24/17 0752  NA  --   < > 146* 145  K  --   < > 3.5 4.1  CL  --   < > 108 109  CO2  --   < > 24 26  GLUCOSE  --   < > 143* 209*  BUN  --   < > 45* 59*  CREATININE  --   < > 2.49* 2.50*  CALCIUM  --   < > 8.5* 8.0*  MG 2.5*  --   --   --   AST  --   --  30  --   ALT  --   --  12*  --   ALKPHOS  --   --  68  --   BILITOT  --   --  0.9  --   < > = values in this interval not displayed.   Microbiology Results  Results for orders placed or performed during the hospital encounter of 03/20/17  Urine culture     Status: Abnormal   Collection Time: 03/20/17  5:45 AM  Result Value Ref Range Status   Specimen Description URINE, RANDOM  Final   Special Requests NONE  Final   Culture >=100,000 COLONIES/mL STAPHYLOCOCCUS EPIDERMIDIS (A)  Final   Report Status 03/22/2017 FINAL  Final   Organism ID, Bacteria STAPHYLOCOCCUS EPIDERMIDIS (A)  Final      Susceptibility   Staphylococcus epidermidis - MIC*    CIPROFLOXACIN >=8 RESISTANT Resistant     GENTAMICIN <=0.5 SENSITIVE Sensitive     NITROFURANTOIN <=16 SENSITIVE Sensitive     OXACILLIN >=4 RESISTANT Resistant     TETRACYCLINE <=1 SENSITIVE Sensitive     VANCOMYCIN 2 SENSITIVE Sensitive     TRIMETH/SULFA >=320 RESISTANT Resistant     CLINDAMYCIN <=0.25 SENSITIVE Sensitive     RIFAMPIN <=0.5 SENSITIVE Sensitive     Inducible Clindamycin NEGATIVE Sensitive     * >=100,000 COLONIES/mL STAPHYLOCOCCUS EPIDERMIDIS    RADIOLOGY:  No results found.   Management plans discussed with the patient, family and they are in agreement.  CODE STATUS:     Code Status Orders        Start     Ordered   03/20/17 1627  Do not attempt resuscitation (DNR)  Continuous    Question Answer Comment  In the event of cardiac or respiratory ARREST Do not call a "code blue"   In the event of cardiac or respiratory ARREST Do  not perform Intubation, CPR, defibrillation or ACLS   In the event of cardiac or  respiratory ARREST Use medication by any route, position, wound care, and other measures to relive pain and suffering. May use oxygen, suction and manual treatment of airway obstruction as needed for comfort.      03/20/17 1626    Code Status History    Date Active Date Inactive Code Status Order ID Comments User Context   03/20/2017  1:21 PM 03/20/2017  4:25 PM Full Code [id number]  Max Sane, MD Inpatient   03/20/2017  9:07 AM 03/20/2017  1:21 PM DNR [id number]  Max Sane, MD ED   02/22/2017  3:00 PM 02/24/2017  2:24 PM DNR [id number]  Loletha Grayer, MD ED   01/24/2017 11:23 AM 01/24/2017  5:25 PM DNR [id number]  Vaughan Basta, MD Inpatient   01/20/2017 12:10 AM 01/24/2017 11:22 AM Full Code [id number]  Idelle Crouch, MD ED   01/19/2017  9:54 PM 01/20/2017 12:10 AM DNR [id number]  Harvie Bridge, DO ED   11/29/2016  5:16 AM 12/04/2016  8:01 PM DNR [id number]  Harrie Foreman, MD Inpatient   11/19/2016  7:00 PM 11/23/2016  8:20 PM DNR [id number]  Gladstone Lighter, MD Inpatient   02/12/2016  8:30 PM 02/14/2016  6:23 PM DNR [id number]  Hillary Bow, MD ED    Advance Directive Documentation     Most Recent Value  Type of Advance Directive  Healthcare Power of [place], Out of facility DNR (pink MOST or yellow form)  Pre-existing out of facility DNR order (yellow form or pink MOST form)  -  "MOST" Form in Place?  -      TOTAL TIME TAKING CARE OF THIS PATIENT: 38 minutes.    Gladstone Lighter M.D on 03/25/2017 at 12:22 PM  Between 7am to 6pm - Pager - [phone]  After 6pm go to [email or web address] - Proofreader  [facility]  Office  [phone]  CC: Primary care physician; Kirk Ruths., MD   Note: This dictation was prepared with Dragon dictation along with smaller phrase technology. Any transcriptional errors that result from this process are unintentional.

## 2017-03-25 NOTE — NC FL2 (Signed)
[place] LEVEL OF CARE SCREENING TOOL     IDENTIFICATION  Patient Name: Dean Garcia Birthdate: [birth year] Sex: male Admission Date (Current Location): 03/20/2017  [place] and [place] Number:  Engineering geologist and Address:  [facility], [address], [place], [place] [address]      Provider Number: [id number]  Attending Physician Name and Address:  Gladstone Lighter, MD  Relative Name and Phone Number:       Current Level of Care: Hospital Recommended Level of Care: [place] Prior Approval Number:    Date Approved/Denied:   PASRR Number: [id number]  Discharge Plan: SNF    Current Diagnoses: Patient Active Problem List   Diagnosis Date Noted  . Elevated troponin 03/20/2017  . Sepsis ([place]) 03/20/2017  . Bradycardia 03/20/2017  . Acute lower UTI 03/20/2017  . Cramping of feet 03/20/2017  . CKD (chronic kidney disease), stage IV ([place]) 03/20/2017  . Swelling of lower extremity 03/20/2017  . ASO (arteriosclerosis obliterans) 03/14/2017  . Iliac artery aneurysm ([place]) 03/14/2017  . Lymphedema 03/08/2017  . Epididymitis 02/22/2017  . Pain in limb 02/07/2017  . Hypoglycemia 01/19/2017  . Depression 01/19/2017  . Hypoglycemia due to insulin 01/19/2017  . Muscle weakness (generalized)   . SOB (shortness of breath)   . Palliative care by specialist   . HCAP (healthcare-associated pneumonia) 11/29/2016  . Pressure injury of skin 11/20/2016  . Palliative care encounter   . Goals of care, counseling/discussion   . Acute renal failure superimposed on chronic kidney disease ([place])   . ARF (acute renal failure) ([place]) 11/19/2016  . Moderate major depression, single episode ([place]) 10/22/2016  . Mild dementia 10/22/2016  . Paroxysmal atrial fibrillation ([place]) 02/21/2016  . A-fib ([place]) 02/12/2016  . Centrilobular emphysema ([place]) 12/27/2015  . B12 deficiency 04/12/2015  . Atherosclerosis of aorta ([place]) 10/23/2014  .  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 Has a tremor 07/17/2014  . Type 2 diabetes mellitus ([place]) 07/17/2014  . Edema 07/17/2014  . Billowing mitral valve 07/12/2014  . BP (high blood pressure) 07/12/2014  . Carotid artery narrowing 07/12/2014  . Impaired renal function 06/07/2014  . Proctitis, radiation 06/07/2014  . Malignant neoplasm of prostate ([place]) 06/07/2014  . Peripheral nerve disease 06/07/2014  . DJD (degenerative joint disease) 06/07/2014  . Disorder of peripheral nervous system 06/07/2014  . Gout 06/07/2014  . 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 DD (diverticular disease) 06/07/2014  . Disease of rectum 06/07/2014  . Diabetes mellitus ([place]) 06/07/2014  . Malignant neoplasm of urinary bladder ([facility]) 06/07/2014    Orientation RESPIRATION BLADDER Height & Weight        Normal Incontinent Weight: 172 lb (78 kg) Height:  6\' 1"  (185.4 cm)  BEHAVIORAL SYMPTOMS/MOOD NEUROLOGICAL BOWEL NUTRITION STATUS      Incontinent Diet (Comfort Care)  AMBULATORY STATUS COMMUNICATION OF NEEDS Skin   Total Care Does not communicate Normal                       Personal Care Assistance Level of Assistance  Bathing, Feeding, Dressing, Total care Bathing Assistance: Maximum assistance Feeding assistance: Maximum assistance Dressing Assistance: Maximum assistance Total Care Assistance: Maximum assistance   Functional Limitations Info  Sight, Hearing, Speech Sight Info: Impaired Hearing Info: Impaired Speech Info: Impaired    SPECIAL CARE FACTORS FREQUENCY                       Contractures  Contractures Info: Not present    Additional Factors Info  Code Status, Allergies Code Status Info: DNR Allergies Info: Demerol (Meperidine)           Current Medications (03/25/2017):  This is the current hospital active medication list Current Facility-Administered Medications  Medication Dose Route Frequency Provider Last Rate Last Dose  .  acetaminophen (TYLENOL) suppository 650 mg  650 mg Rectal Q4H PRN Gladstone Lighter, MD      . LORazepam (ATIVAN) injection 1 mg  1 mg Intravenous Q4H PRN Gladstone Lighter, MD   1 mg at 03/25/17 1137  . morphine 2 MG/ML injection 2-3 mg  2-3 mg Intravenous Q2H PRN Gladstone Lighter, MD   2 mg at 03/25/17 1007  . morphine CONCENTRATE 10 MG/0.5ML oral solution 10 mg  10 mg Oral Q1H PRN Gladstone Lighter, MD   10 mg at 03/25/17 1140  . sodium chloride flush (NS) 0.9 % injection 3 mL  3 mL Intravenous Q12H Max Sane, MD   3 mL at 03/25/17 1007     Discharge Medications: Please see discharge summary for a list of discharge medications.  Relevant Imaging Results:  Relevant Lab Results:   Additional Information SSN:  [id number]  Darden Dates, LCSW

## 2017-03-25 NOTE — Progress Notes (Signed)
Pt to be discharged to [facility]  With hospice care.  Discharge  Discussed with pts wife.pt to go by ems.

## 2017-03-25 NOTE — Progress Notes (Signed)
Nutrition Brief Note  Patient identified to be seen for Low [place]. Chart reviewed and discussed with RN. Patient now transitioning to comfort care.   Patient was previously on Heart Healthy diet, but diet order has now been cancelled. Patient not alert enough to eat at this time. No nutrition interventions warranted at this time. Please consult RD as needed.   Willey Blade, MS, RD, LDN Pager: [phone] After Hours Pager: [phone]

## 2017-03-26 DIAGNOSIS — E1142 Type 2 diabetes mellitus with diabetic polyneuropathy: Secondary | ICD-10-CM

## 2017-03-26 DIAGNOSIS — I5032 Chronic diastolic (congestive) heart failure: Secondary | ICD-10-CM | POA: Diagnosis not present

## 2017-03-26 DIAGNOSIS — F39 Unspecified mood [affective] disorder: Secondary | ICD-10-CM | POA: Diagnosis not present

## 2017-03-26 DIAGNOSIS — I481 Persistent atrial fibrillation: Secondary | ICD-10-CM

## 2017-03-26 DIAGNOSIS — R410 Disorientation, unspecified: Secondary | ICD-10-CM

## 2017-03-26 DIAGNOSIS — M4800 Spinal stenosis, site unspecified: Secondary | ICD-10-CM

## 2017-03-28 ENCOUNTER — Ambulatory Visit: Payer: Self-pay | Admitting: Urology

## 2017-04-02 DEATH — deceased

## 2017-04-08 ENCOUNTER — Ambulatory Visit (INDEPENDENT_AMBULATORY_CARE_PROVIDER_SITE_OTHER): Payer: Medicare Other | Admitting: Vascular Surgery

## 2017-04-08 ENCOUNTER — Encounter (INDEPENDENT_AMBULATORY_CARE_PROVIDER_SITE_OTHER): Payer: Medicare Other

## 2017-05-13 ENCOUNTER — Ambulatory Visit (INDEPENDENT_AMBULATORY_CARE_PROVIDER_SITE_OTHER): Payer: Medicare Other | Admitting: Vascular Surgery

## 2017-10-29 IMAGING — CR DG CHEST 2V
1 series · 3 of 3 positions shown · non-contrast
Comparison: Radiographs 11/19/2016

CLINICAL DATA: Shortness of breath.

EXAM:
CHEST  2 VIEW

[Series 1: dg chest 2 view · 0.14mm/px · 3 of 3 slices shown]
[im 1/3]
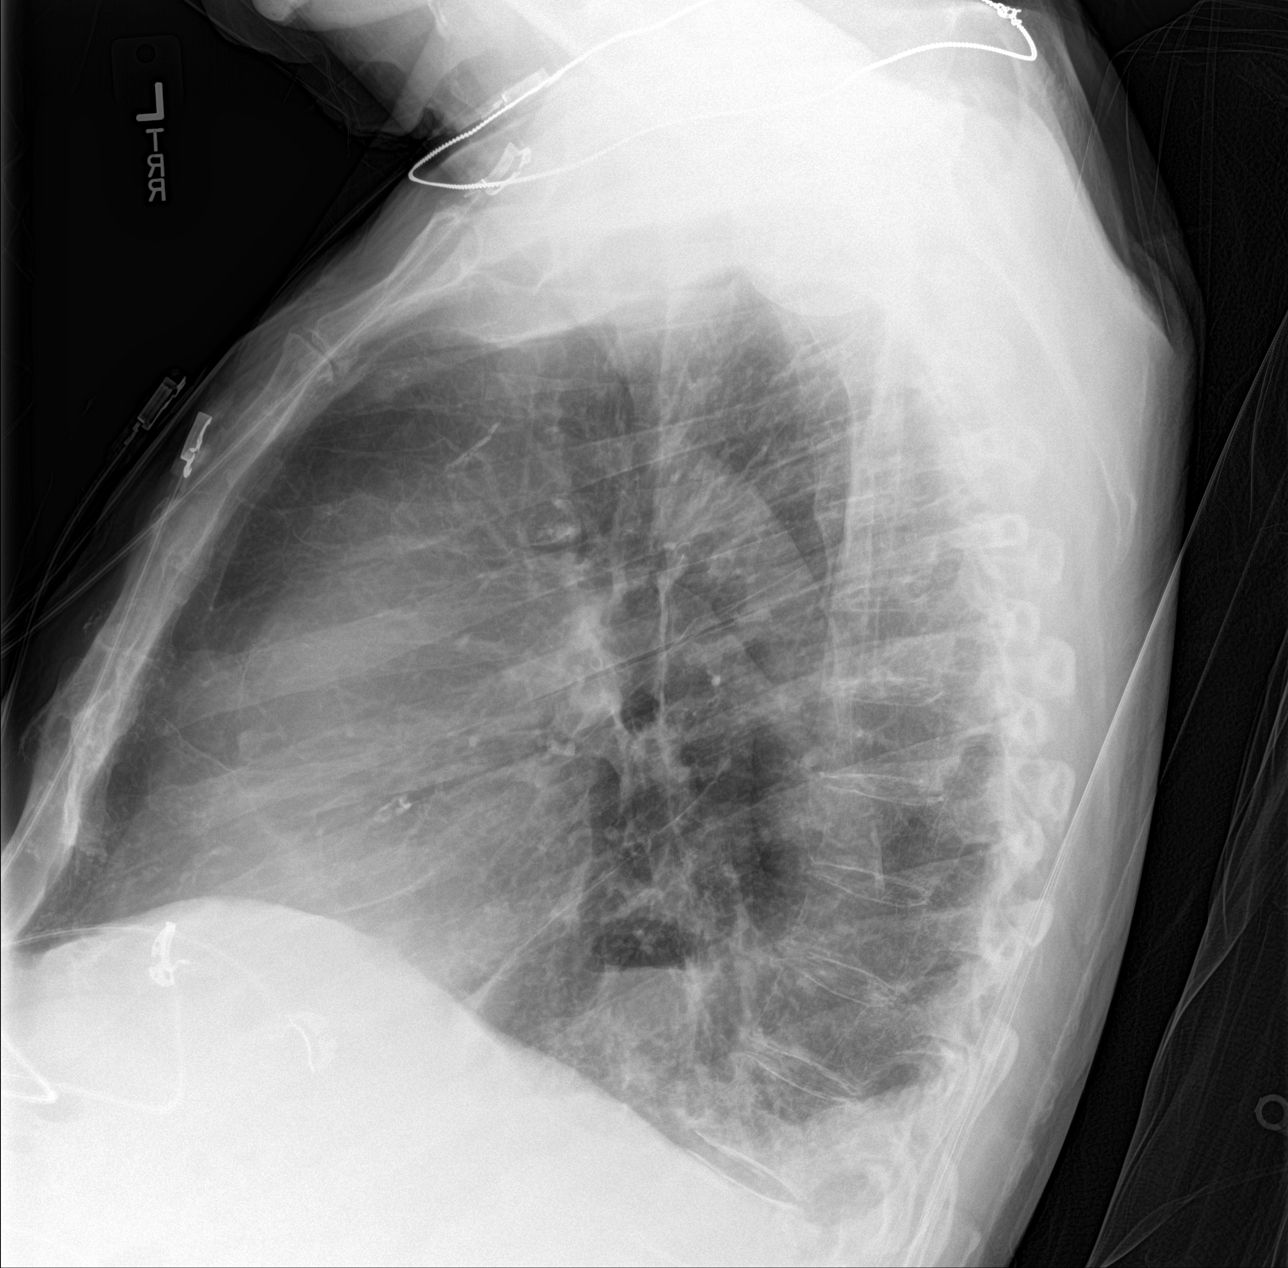
[im 2/3]
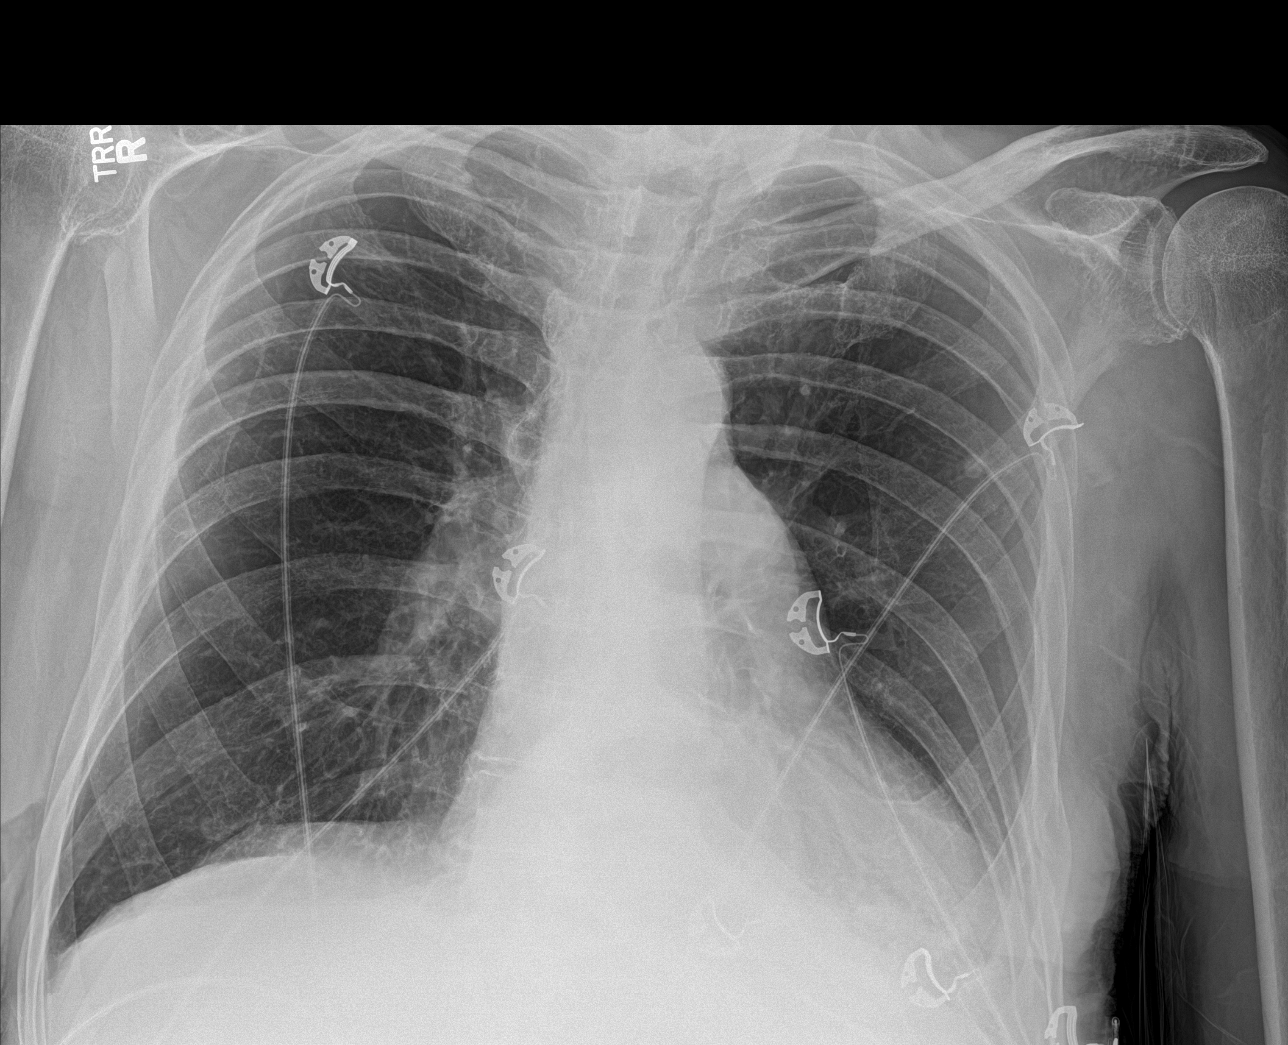
[im 3/3]
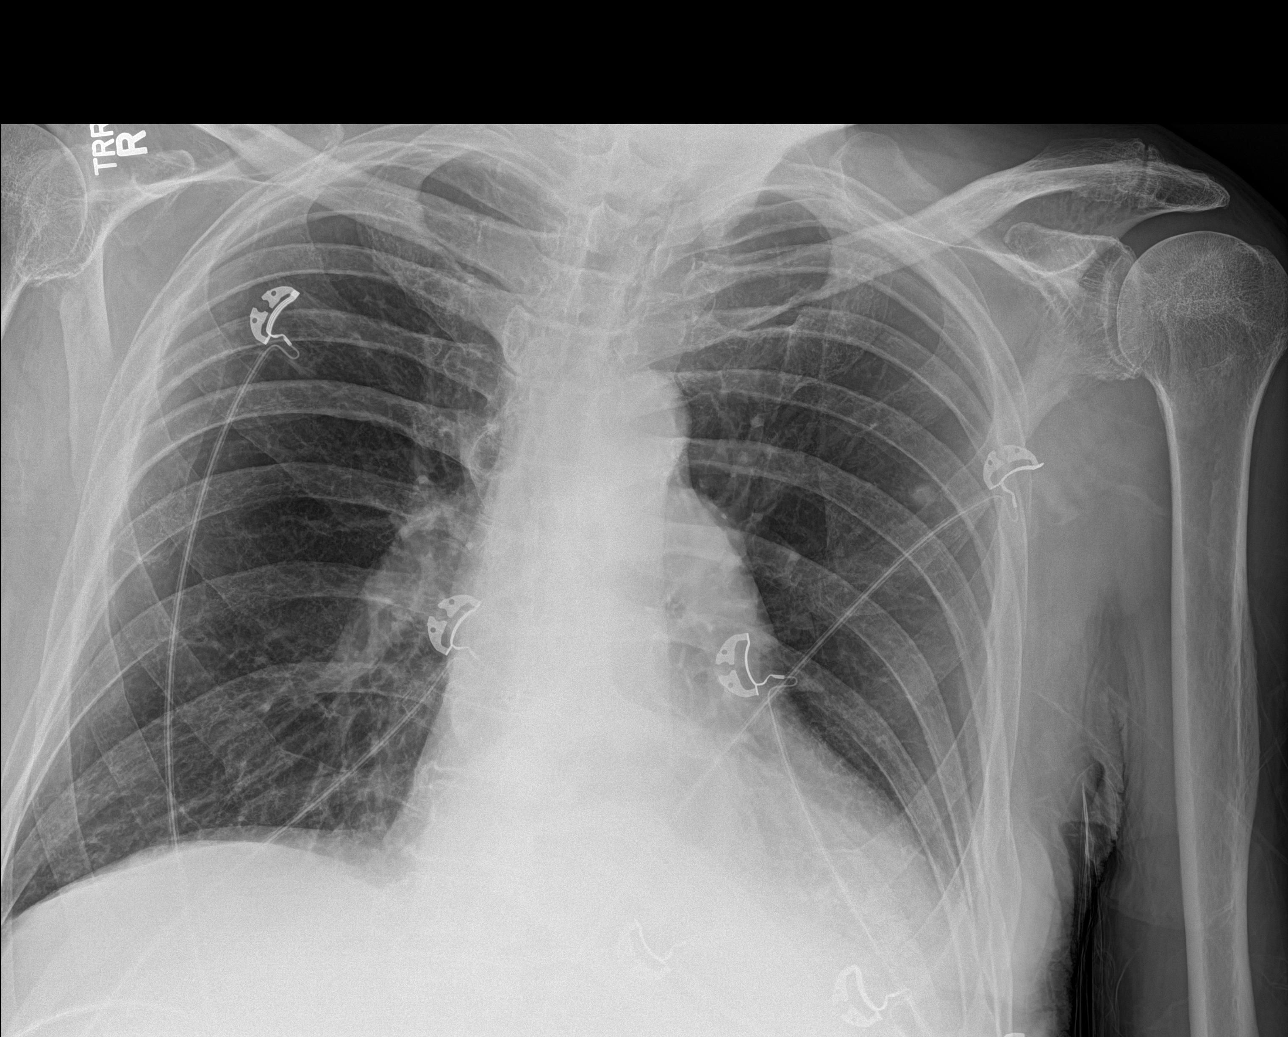

[3 of 3 positions shown; findings below may reference images not displayed]

FINDINGS: New left lower lobe airspace disease posteriorly suspicious for
pneumonia. Possible small left pleural effusion. The right lung is
clear. Calcified granulomas in the left lung. Unchanged heart size
and mediastinal contours. Hilar prominence secondary to vascular
structures as seen on chest CT December 2015. No pneumothorax. No
acute osseous abnormality is seen. There are old left-sided rib
fractures.
IMPRESSION: New left lower lobe airspace disease suspicious for pneumonia with
small effusion.

## 2018-01-07 ENCOUNTER — Ambulatory Visit: Payer: Self-pay | Admitting: Urology

## 2018-02-17 IMAGING — MR MR LUMBAR SPINE W/O CM
8 of 10 series · 38 of 48 positions shown · non-contrast
Comparison: None.

CLINICAL DATA: Generalized weakness and shaking.

EXAM:
MRI LUMBAR SPINE WITHOUT CONTRAST
TECHNIQUE: Multiplanar, multisequence MR imaging of the lumbar spine was
performed. No intravenous contrast was administered.

[Series 3: T2 · sagittal · 3.0mm · 0.70mm/px · 4 of 15 slices shown (1 of 4)]
[im 1/15]
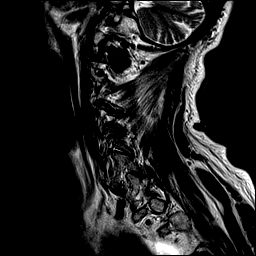
[im 5/15]
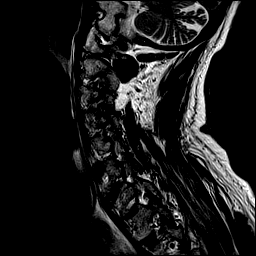
[im 10/15]
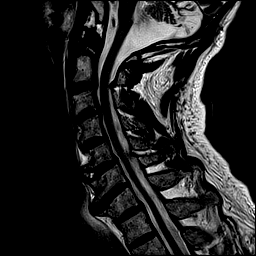
[im 15/15]
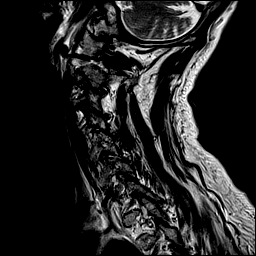

[Series 4: T1 · sagittal · 3.0mm · 0.70mm/px · 4 of 15 slices shown (1 of 3)]
[im 1/15]
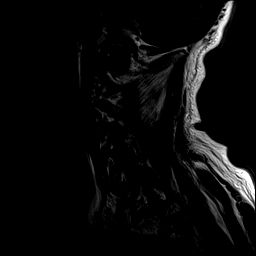
[im 5/15]
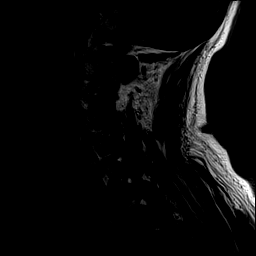
[im 10/15]
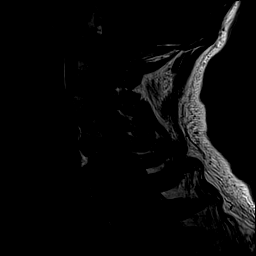
[im 15/15]
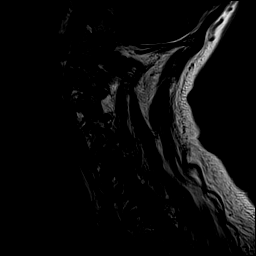

[Series 5: STIR · sagittal · 3.0mm · 0.35mm/px · 2 of 15 slices shown]
[im 1/15]
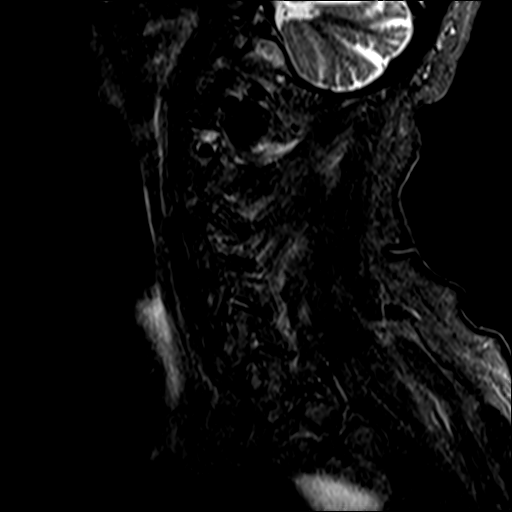
[im 8/15]
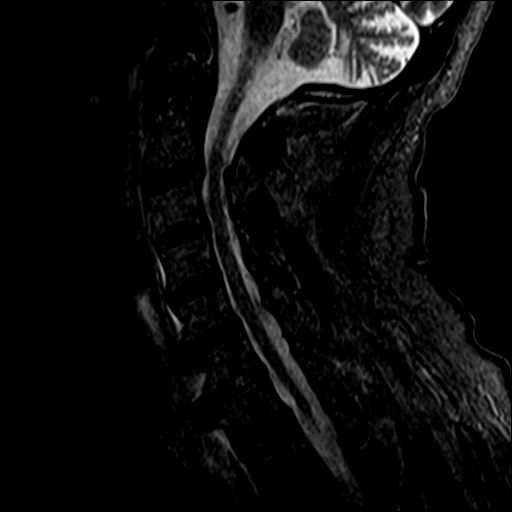

[Series 6: T2 · axial · 3.0mm · 0.70mm/px · z∈[-68,+36]mm · 6 of 29 slices shown (2 of 4)]
[im 1/29]
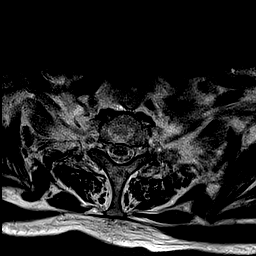
[im 6/29]
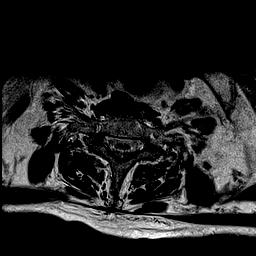
[im 12/29]
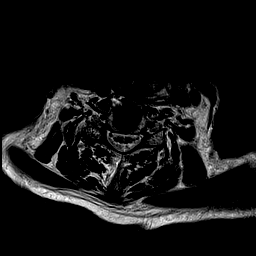
[im 17/29]
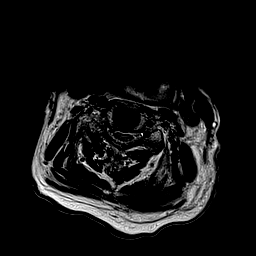
[im 23/29]
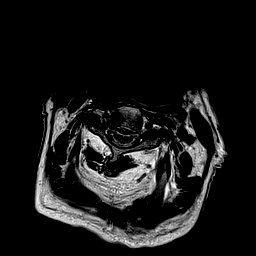
[im 29/29]
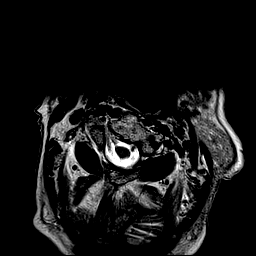

[Series 9: T2 · sagittal · 4.0mm · 0.81mm/px · 3 of 17 slices shown (3 of 4)]
[im 1/17]
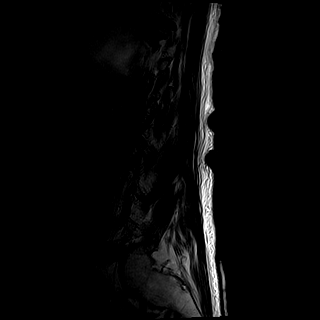
[im 9/17]
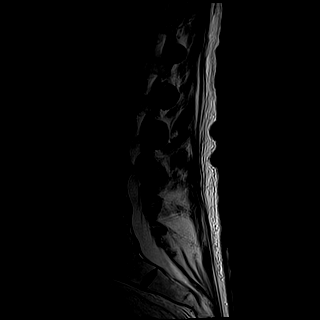
[im 17/17]
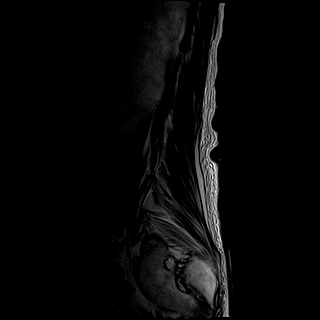

[Series 10: T1 · sagittal · 4.0mm · 0.81mm/px · 3 of 17 slices shown (2 of 3)]
[im 1/17]
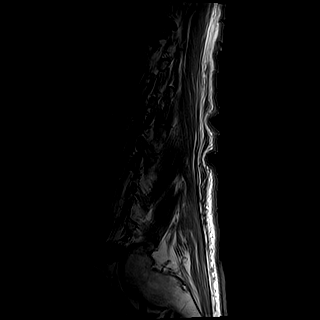
[im 9/17]
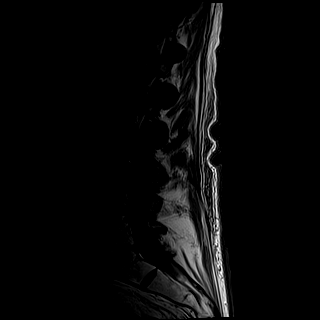
[im 17/17]
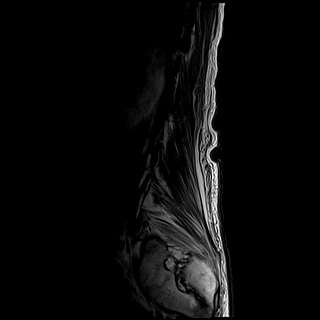

[Series 12: T2 · axial · 4.0mm · 0.78mm/px · z∈[-529,-308]mm · 8 of 40 slices shown (4 of 4)]
[im 1/40]
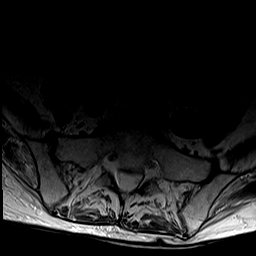
[im 6/40]
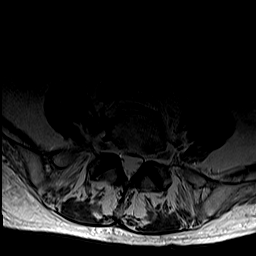
[im 12/40]
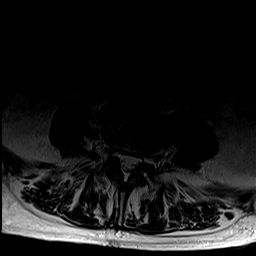
[im 17/40]
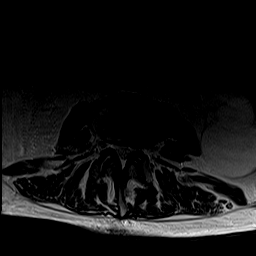
[im 23/40]
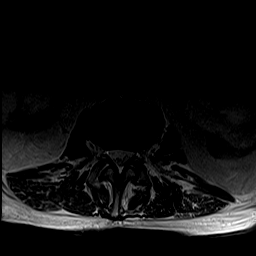
[im 28/40]
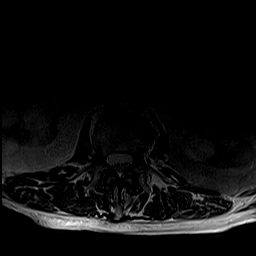
[im 34/40]
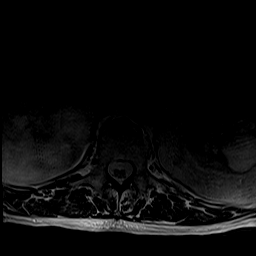
[im 40/40]
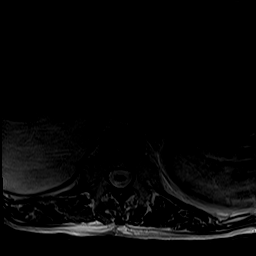

[Series 13: T1 · axial · 4.0mm · 0.39mm/px · z∈[-529,-308]mm · 8 of 40 slices shown (3 of 3)]
[im 1/40]
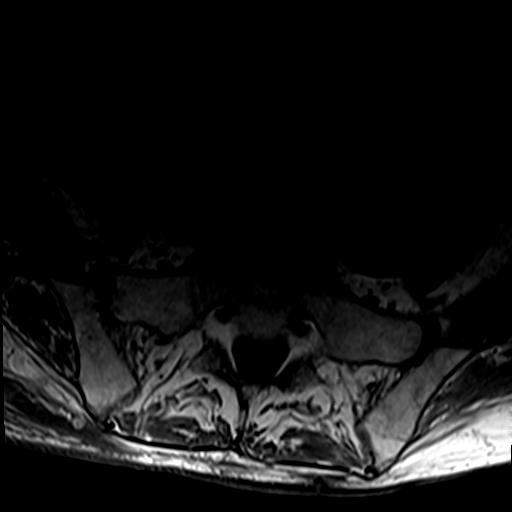
[im 6/40]
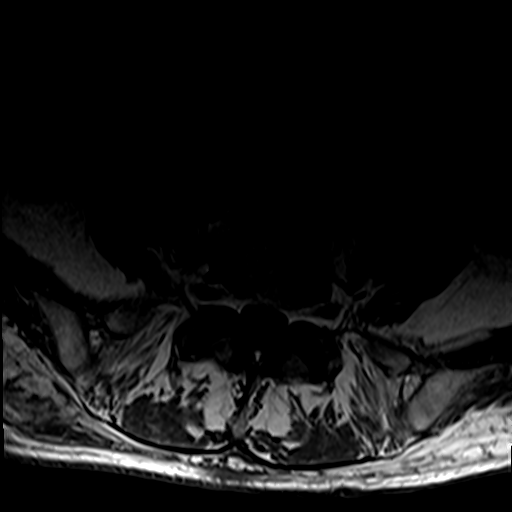
[im 12/40]
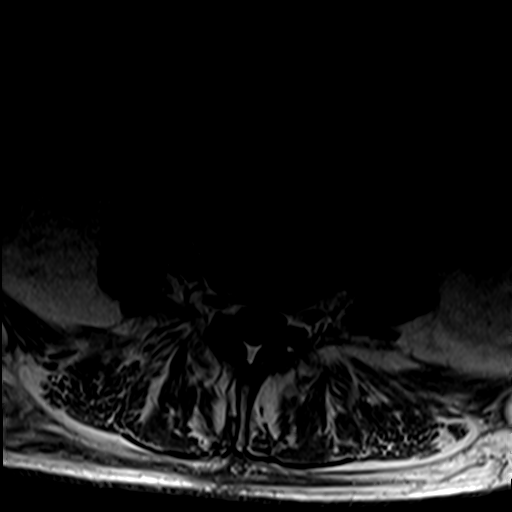
[im 17/40]
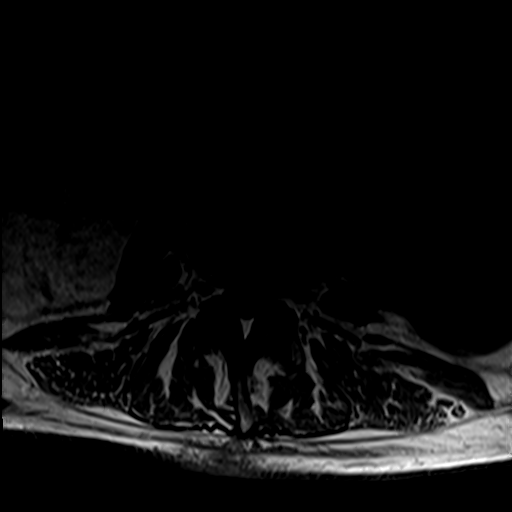
[im 23/40]
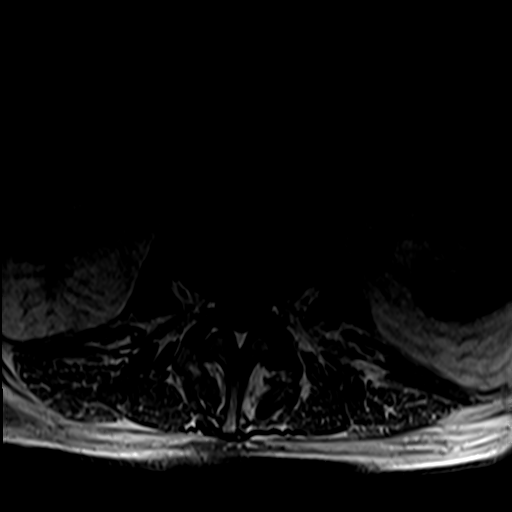
[im 28/40]
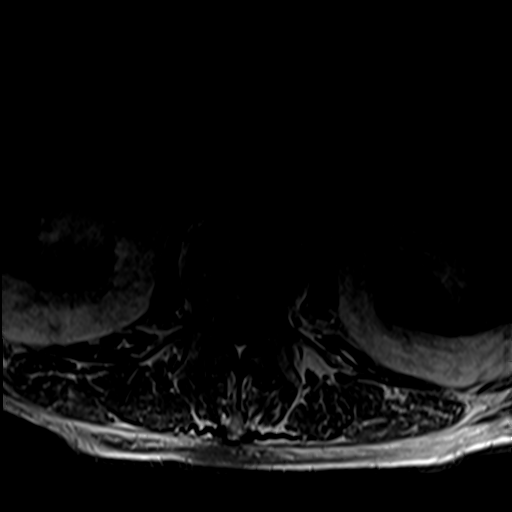
[im 34/40]
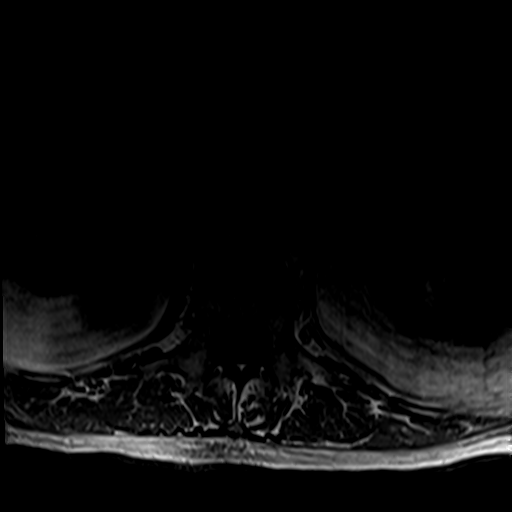
[im 40/40]
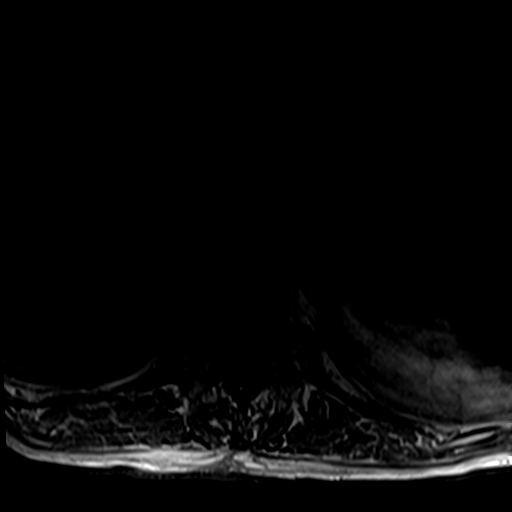

[38 of 48 positions shown; findings below may reference images not displayed]

FINDINGS: Segmentation:  5 lumbar type vertebral bodies.

Alignment:  Minimal scoliotic curvature.  2 mm retrolisthesis L4-5.

Vertebrae: No fracture or significant primary bone lesion. Possible
L3 lipoma or hemangioma.

Conus medullaris: Extends to the L1-2 level and appears normal.

Paraspinal and other soft tissues: Renal cysts, not completely are
primarily evaluated.

Disc levels:

No significant finding at L2-3 or above. Minimal disc bulges. No
stenosis.

L3-4: Shallow circumferential protrusion of the disc. Facet and
ligamentous hypertrophy. Mild multifactorial stenosis but without
definite neural compression.

L4-5: Bulging of the disc more towards the right. Facet degeneration
and hypertrophy more on the right. Mild stenosis of the right
lateral recess without definite neural compression.

L5-S1: Minimal disc bulge. Mild facet osteoarthritis. No stenosis.
IMPRESSION: No likely acute or significant finding in the lumbar spine relative
to the clinical presentation. Ordinary lower lumbar degenerative
changes. Mild stenosis of the lateral recesses bilaterally at L3-4
and on the right at L4-5 but without definite neural compression.
